# Patient Record
Sex: Male | Born: 1950 | Race: White | Hispanic: No | Marital: Married | Smoking: Never smoker
Health system: Southern US, Community
[De-identification: ages and names within clinical notes are randomized; demographics above are authoritative.]

## PROBLEM LIST (undated history)

## (undated) DIAGNOSIS — E119 Type 2 diabetes mellitus without complications: Secondary | ICD-10-CM

## (undated) DIAGNOSIS — D696 Thrombocytopenia, unspecified: Secondary | ICD-10-CM

## (undated) DIAGNOSIS — K7460 Unspecified cirrhosis of liver: Secondary | ICD-10-CM

## (undated) DIAGNOSIS — E7800 Pure hypercholesterolemia, unspecified: Secondary | ICD-10-CM

## (undated) DIAGNOSIS — M109 Gout, unspecified: Secondary | ICD-10-CM

## (undated) DIAGNOSIS — C4490 Unspecified malignant neoplasm of skin, unspecified: Secondary | ICD-10-CM

## (undated) DIAGNOSIS — I10 Essential (primary) hypertension: Secondary | ICD-10-CM

## (undated) DIAGNOSIS — N400 Enlarged prostate without lower urinary tract symptoms: Secondary | ICD-10-CM

## (undated) HISTORY — DX: Unspecified cirrhosis of liver: K74.60

## (undated) HISTORY — PX: KNEE ARTHROSCOPY: SUR90

---

## 2006-06-26 ENCOUNTER — Ambulatory Visit: Payer: Self-pay | Admitting: Gastroenterology

## 2013-12-15 DIAGNOSIS — M1A9XX0 Chronic gout, unspecified, without tophus (tophi): Secondary | ICD-10-CM | POA: Insufficient documentation

## 2013-12-15 DIAGNOSIS — I10 Essential (primary) hypertension: Secondary | ICD-10-CM | POA: Insufficient documentation

## 2013-12-15 DIAGNOSIS — E7800 Pure hypercholesterolemia, unspecified: Secondary | ICD-10-CM | POA: Insufficient documentation

## 2014-06-17 DIAGNOSIS — E119 Type 2 diabetes mellitus without complications: Secondary | ICD-10-CM | POA: Insufficient documentation

## 2015-01-08 DIAGNOSIS — C4490 Unspecified malignant neoplasm of skin, unspecified: Secondary | ICD-10-CM

## 2015-01-08 HISTORY — DX: Unspecified malignant neoplasm of skin, unspecified: C44.90

## 2015-09-25 NOTE — Discharge Instructions (Signed)

## 2015-09-27 ENCOUNTER — Ambulatory Visit: Payer: Commercial Managed Care - PPO | Admitting: Anesthesiology

## 2015-09-27 ENCOUNTER — Encounter
Admission: RE | Disposition: A | Discharge status: Home / Self Care | Payer: Self-pay | Source: Ambulatory Visit | Attending: Ophthalmology

## 2015-09-27 ENCOUNTER — Ambulatory Visit
Admission: RE | Admit: 2015-09-27 | Discharge: 2015-09-27 | Disposition: A | Discharge status: Home / Self Care | Payer: Commercial Managed Care - PPO | Source: Ambulatory Visit | Attending: Ophthalmology | Admitting: Ophthalmology

## 2015-09-27 DIAGNOSIS — I10 Essential (primary) hypertension: Secondary | ICD-10-CM | POA: Diagnosis not present

## 2015-09-27 DIAGNOSIS — Z7984 Long term (current) use of oral hypoglycemic drugs: Secondary | ICD-10-CM | POA: Insufficient documentation

## 2015-09-27 DIAGNOSIS — M109 Gout, unspecified: Secondary | ICD-10-CM | POA: Insufficient documentation

## 2015-09-27 DIAGNOSIS — E7800 Pure hypercholesterolemia, unspecified: Secondary | ICD-10-CM | POA: Insufficient documentation

## 2015-09-27 DIAGNOSIS — H2511 Age-related nuclear cataract, right eye: Secondary | ICD-10-CM | POA: Insufficient documentation

## 2015-09-27 DIAGNOSIS — Z79899 Other long term (current) drug therapy: Secondary | ICD-10-CM | POA: Insufficient documentation

## 2015-09-27 DIAGNOSIS — E1136 Type 2 diabetes mellitus with diabetic cataract: Secondary | ICD-10-CM | POA: Insufficient documentation

## 2015-09-27 HISTORY — PX: CATARACT EXTRACTION W/PHACO: SHX586

## 2015-09-27 HISTORY — DX: Unspecified malignant neoplasm of skin, unspecified: C44.90

## 2015-09-27 HISTORY — DX: Type 2 diabetes mellitus without complications: E11.9

## 2015-09-27 HISTORY — DX: Essential (primary) hypertension: I10

## 2015-09-27 HISTORY — DX: Gout, unspecified: M10.9

## 2015-09-27 HISTORY — DX: Pure hypercholesterolemia, unspecified: E78.00

## 2015-09-27 HISTORY — DX: Benign prostatic hyperplasia without lower urinary tract symptoms: N40.0

## 2015-09-27 LAB — GLUCOSE, CAPILLARY
Glucose-Capillary: 136 mg/dL — ABNORMAL HIGH (ref 65–99)
Glucose-Capillary: 137 mg/dL — ABNORMAL HIGH (ref 65–99)

## 2015-09-27 SURGERY — PHACOEMULSIFICATION, CATARACT, WITH IOL INSERTION
Anesthesia: Monitor Anesthesia Care | Site: Eye | Laterality: Right | Wound class: Clean

## 2015-09-27 MED ORDER — ACETAMINOPHEN 160 MG/5ML PO SOLN: 325.0000 mg | ORAL | Status: DC | PRN

## 2015-09-27 MED ORDER — TETRACAINE HCL 0.5 % OP SOLN
1.0000 [drp] | OPHTHALMIC | Status: DC | PRN
  Administered 2015-09-27: 1 [drp] via OPHTHALMIC

## 2015-09-27 MED ORDER — MIDAZOLAM HCL 2 MG/2ML IJ SOLN
INTRAMUSCULAR | Status: DC | PRN
  Administered 2015-09-27: 2 mg via INTRAVENOUS

## 2015-09-27 MED ORDER — ARMC OPHTHALMIC DILATING GEL
1.0000 "application " | OPHTHALMIC | Status: DC | PRN
  Administered 2015-09-27 (×2): 1 via OPHTHALMIC

## 2015-09-27 MED ORDER — POVIDONE-IODINE 5 % OP SOLN
1.0000 "application " | OPHTHALMIC | Status: DC | PRN
  Administered 2015-09-27: 1 via OPHTHALMIC

## 2015-09-27 MED ORDER — FENTANYL CITRATE (PF) 100 MCG/2ML IJ SOLN
INTRAMUSCULAR | Status: DC | PRN
  Administered 2015-09-27: 50 ug via INTRAVENOUS

## 2015-09-27 MED ORDER — CEFUROXIME OPHTHALMIC INJECTION 1 MG/0.1 ML
INJECTION | OPHTHALMIC | Status: DC | PRN
  Administered 2015-09-27: 0.1 mL via OPHTHALMIC

## 2015-09-27 MED ORDER — EPINEPHRINE HCL 1 MG/ML IJ SOLN
INTRAOCULAR | Status: DC | PRN
  Administered 2015-09-27: 67 mL via OPHTHALMIC

## 2015-09-27 MED ORDER — TIMOLOL MALEATE 0.5 % OP SOLN
OPHTHALMIC | Status: DC | PRN
  Administered 2015-09-27: 1 [drp] via OPHTHALMIC

## 2015-09-27 MED ORDER — NA HYALUR & NA CHOND-NA HYALUR 0.4-0.35 ML IO KIT
PACK | INTRAOCULAR | Status: DC | PRN
  Administered 2015-09-27: 1 mL via INTRAOCULAR

## 2015-09-27 MED ORDER — ACETAMINOPHEN 325 MG PO TABS: 325.0000 mg | ORAL_TABLET | ORAL | Status: DC | PRN

## 2015-09-27 MED ORDER — BRIMONIDINE TARTRATE 0.2 % OP SOLN
OPHTHALMIC | Status: DC | PRN
  Administered 2015-09-27: 1 [drp] via OPHTHALMIC

## 2015-09-27 MED ORDER — LIDOCAINE HCL (PF) 4 % IJ SOLN
INTRAMUSCULAR | Status: DC | PRN
  Administered 2015-09-27: 1 mL via OPHTHALMIC

## 2015-09-27 SURGICAL SUPPLY — 25 items
CANNULA ANT/CHMB 27GA (MISCELLANEOUS) ×3 IMPLANT
CARTRIDGE ABBOTT (MISCELLANEOUS) IMPLANT
GLOVE SURG LX 7.5 STRW (GLOVE) ×2
GLOVE SURG LX STRL 7.5 STRW (GLOVE) ×1 IMPLANT
GLOVE SURG TRIUMPH 8.0 PF LTX (GLOVE) ×3 IMPLANT
GOWN STRL REUS W/ TWL LRG LVL3 (GOWN DISPOSABLE) ×2 IMPLANT
GOWN STRL REUS W/TWL LRG LVL3 (GOWN DISPOSABLE) ×4
LENS IOL TECNIS ITEC 20.0 (Intraocular Lens) ×3 IMPLANT
MARKER SKIN DUAL TIP RULER LAB (MISCELLANEOUS) ×3 IMPLANT
NDL RETROBULBAR .5 NSTRL (NEEDLE) IMPLANT
NEEDLE FILTER BLUNT 18X 1/2SAF (NEEDLE) ×4
NEEDLE FILTER BLUNT 18X1 1/2 (NEEDLE) ×2 IMPLANT
PACK CATARACT BRASINGTON (MISCELLANEOUS) ×3 IMPLANT
PACK EYE AFTER SURG (MISCELLANEOUS) ×3 IMPLANT
PACK OPTHALMIC (MISCELLANEOUS) ×3 IMPLANT
RING MALYGIN 7.0 (MISCELLANEOUS) IMPLANT
SUT ETHILON 10-0 CS-B-6CS-B-6 (SUTURE)
SUT VICRYL  9 0 (SUTURE)
SUT VICRYL 9 0 (SUTURE) IMPLANT
SUTURE EHLN 10-0 CS-B-6CS-B-6 (SUTURE) IMPLANT
SYR 3ML LL SCALE MARK (SYRINGE) ×6 IMPLANT
SYR 5ML LL (SYRINGE) ×3 IMPLANT
SYR TB 1ML LUER SLIP (SYRINGE) ×3 IMPLANT
WATER STERILE IRR 250ML POUR (IV SOLUTION) ×3 IMPLANT
WIPE NON LINTING 3.25X3.25 (MISCELLANEOUS) ×3 IMPLANT

## 2015-09-27 NOTE — Transfer of Care (Signed)
Immediate Anesthesia Transfer of Care Note  Patient: Dean Lynch  Procedure(s) Performed: Procedure(s) with comments: CATARACT EXTRACTION PHACO AND INTRAOCULAR LENS PLACEMENT (IOC) (Right) - DIABETIC  Patient Location: PACU  Anesthesia Type: MAC  Level of Consciousness: awake, alert  and patient cooperative  Airway and Oxygen Therapy: Patient Spontanous Breathing and Patient connected to supplemental oxygen  Post-op Assessment: Post-op Vital signs reviewed, Patient's Cardiovascular Status Stable, Respiratory Function Stable, Patent Airway and No signs of Nausea or vomiting  Post-op Vital Signs: Reviewed and stable  Complications: No apparent anesthesia complications

## 2015-09-27 NOTE — H&P (Signed)
The History and Physical notes are on paper, have been signed, and are to be scanned. The patient remains stable and unchanged from the H&P.   Previous H&P reviewed, patient examined, and there are no changes.  Dean Lynch 09/27/2015 7:39 AM

## 2015-09-27 NOTE — Anesthesia Procedure Notes (Signed)
Procedure Name: MAC Performed by: Jaslynn Thome Pre-anesthesia Checklist: Patient identified, Emergency Drugs available, Suction available, Timeout performed and Patient being monitored Patient Re-evaluated:Patient Re-evaluated prior to inductionOxygen Delivery Method: Nasal cannula Placement Confirmation: positive ETCO2     

## 2015-09-27 NOTE — Anesthesia Preprocedure Evaluation (Signed)
Anesthesia Evaluation  Patient identified by MRN, date of birth, ID band Patient awake    Reviewed: Allergy & Precautions, H&P , NPO status , Patient's Chart, lab work & pertinent test results  Airway Mallampati: II  TM Distance: >3 FB Neck ROM: full    Dental   Pulmonary    breath sounds clear to auscultation       Cardiovascular hypertension,  Rhythm:regular Rate:Normal     Neuro/Psych    GI/Hepatic   Endo/Other  diabetes  Renal/GU      Musculoskeletal   Abdominal   Peds  Hematology   Anesthesia Other Findings   Reproductive/Obstetrics                             Anesthesia Physical Anesthesia Plan  ASA: II  Anesthesia Plan: MAC   Post-op Pain Management:    Induction:   Airway Management Planned:   Additional Equipment:   Intra-op Plan:   Post-operative Plan:   Informed Consent: I have reviewed the patients History and Physical, chart, labs and discussed the procedure including the risks, benefits and alternatives for the proposed anesthesia with the patient or authorized representative who has indicated his/her understanding and acceptance.     Plan Discussed with: CRNA  Anesthesia Plan Comments:         Anesthesia Quick Evaluation

## 2015-09-27 NOTE — Op Note (Signed)
LOCATION:  [place]   PREOPERATIVE DIAGNOSIS:    Nuclear sclerotic cataract right eye. H25.11   POSTOPERATIVE DIAGNOSIS:  Nuclear sclerotic cataract right eye.     PROCEDURE:  Phacoemusification with posterior chamber intraocular lens placement of the right eye   LENS:   Implant Name Type Inv. Item Serial No. Manufacturer Lot No. LRB No. Used  LENS IOL DIOP 20.0 - [id number] Intraocular Lens LENS IOL DIOP 20.0 [id number]   Right 1        ULTRASOUND TIME: 20 % of 1 minutes, 20 seconds.  CDE 16.0   SURGEON:  Wyonia Hough, MD   ANESTHESIA:  Topical with tetracaine drops and 2% Xylocaine jelly, augmented with 1% preservative-free intracameral lidocaine.    COMPLICATIONS:  None.   DESCRIPTION OF PROCEDURE:  The patient was identified in the holding room and transported to the operating room and placed in the supine position under the operating microscope.  The right eye was identified as the operative eye and it was prepped and draped in the usual sterile ophthalmic fashion.   A 1 millimeter clear-corneal paracentesis was made at the 12:00 position.  0.5 ml of preservative-free 1% lidocaine was injected into the anterior chamber. The anterior chamber was filled with Viscoat viscoelastic.  A 2.4 millimeter keratome was used to make a near-clear corneal incision at the 9:00 position.  A curvilinear capsulorrhexis was made with a cystotome and capsulorrhexis forceps.  Balanced salt solution was used to hydrodissect and hydrodelineate the nucleus.   Phacoemulsification was then used in stop and chop fashion to remove the lens nucleus and epinucleus.  The remaining cortex was then removed using the irrigation and aspiration handpiece. Provisc was then placed into the capsular bag to distend it for lens placement.  A lens was then injected into the capsular bag.  The remaining viscoelastic was aspirated.   Wounds were hydrated with balanced salt solution.  The anterior  chamber was inflated to a physiologic pressure with balanced salt solution.  No wound leaks were noted. Cefuroxime 0.1 ml of a 10mg /ml solution was injected into the anterior chamber for a dose of 1 mg of intracameral antibiotic at the completion of the case.   Timolol and Brimonidine drops were applied to the eye.  The patient was taken to the recovery room in stable condition without complications of anesthesia or surgery.   Racquelle Hyser 09/27/2015, 8:00 AM

## 2015-09-27 NOTE — Anesthesia Postprocedure Evaluation (Signed)
Anesthesia Post Note  Patient: Dean Lynch  Procedure(s) Performed: Procedure(s) (LRB): CATARACT EXTRACTION PHACO AND INTRAOCULAR LENS PLACEMENT (IOC) (Right)  Patient location during evaluation: PACU Anesthesia Type: MAC Level of consciousness: awake and alert Pain management: pain level controlled Vital Signs Assessment: post-procedure vital signs reviewed and stable Respiratory status: spontaneous breathing, nonlabored ventilation, respiratory function stable and patient connected to nasal cannula oxygen Cardiovascular status: stable and blood pressure returned to baseline Anesthetic complications: no    Amaryllis Dyke

## 2015-09-28 ENCOUNTER — Encounter: Payer: Self-pay | Admitting: Ophthalmology

## 2015-12-06 DIAGNOSIS — Z85820 Personal history of malignant melanoma of skin: Secondary | ICD-10-CM | POA: Insufficient documentation

## 2016-07-16 DIAGNOSIS — N400 Enlarged prostate without lower urinary tract symptoms: Secondary | ICD-10-CM | POA: Insufficient documentation

## 2016-07-18 DIAGNOSIS — D696 Thrombocytopenia, unspecified: Secondary | ICD-10-CM | POA: Insufficient documentation

## 2016-08-05 ENCOUNTER — Encounter: Payer: Self-pay | Admitting: Oncology

## 2016-08-05 ENCOUNTER — Inpatient Hospital Stay: Payer: Commercial Managed Care - PPO

## 2016-08-05 ENCOUNTER — Inpatient Hospital Stay: Payer: Commercial Managed Care - PPO | Attending: Oncology | Admitting: Oncology

## 2016-08-05 VITALS — BP 111/70 | HR 71 | Temp 96.4°F | Resp 18 | Ht 69.29 in | Wt 210.2 lb

## 2016-08-05 DIAGNOSIS — N400 Enlarged prostate without lower urinary tract symptoms: Secondary | ICD-10-CM | POA: Insufficient documentation

## 2016-08-05 DIAGNOSIS — M1A9XX0 Chronic gout, unspecified, without tophus (tophi): Secondary | ICD-10-CM | POA: Diagnosis not present

## 2016-08-05 DIAGNOSIS — Z7982 Long term (current) use of aspirin: Secondary | ICD-10-CM | POA: Insufficient documentation

## 2016-08-05 DIAGNOSIS — E7800 Pure hypercholesterolemia, unspecified: Secondary | ICD-10-CM | POA: Diagnosis not present

## 2016-08-05 DIAGNOSIS — D696 Thrombocytopenia, unspecified: Secondary | ICD-10-CM

## 2016-08-05 DIAGNOSIS — E119 Type 2 diabetes mellitus without complications: Secondary | ICD-10-CM | POA: Diagnosis not present

## 2016-08-05 DIAGNOSIS — Z85820 Personal history of malignant melanoma of skin: Secondary | ICD-10-CM | POA: Insufficient documentation

## 2016-08-05 DIAGNOSIS — Z7984 Long term (current) use of oral hypoglycemic drugs: Secondary | ICD-10-CM | POA: Diagnosis not present

## 2016-08-05 DIAGNOSIS — I10 Essential (primary) hypertension: Secondary | ICD-10-CM | POA: Diagnosis not present

## 2016-08-05 LAB — CBC WITH DIFFERENTIAL/PLATELET
BASOS ABS: 0 10*3/uL (ref 0–0.1)
BASOS PCT: 0 %
Eosinophils Absolute: 0.1 10*3/uL (ref 0–0.7)
Eosinophils Relative: 2 %
HCT: 39.2 % — ABNORMAL LOW (ref 40.0–52.0)
Hemoglobin: 13.8 g/dL (ref 13.0–18.0)
LYMPHS ABS: 1.8 10*3/uL (ref 1.0–3.6)
LYMPHS PCT: 28 %
MCH: 35.5 pg — ABNORMAL HIGH (ref 26.0–34.0)
MCHC: 35.3 g/dL (ref 32.0–36.0)
MCV: 100.7 fL — AB (ref 80.0–100.0)
Monocytes Absolute: 0.5 10*3/uL (ref 0.2–1.0)
Monocytes Relative: 8 %
NEUTROS PCT: 62 %
Neutro Abs: 4 10*3/uL (ref 1.4–6.5)
PLATELETS: 101 10*3/uL — AB (ref 150–440)
RBC: 3.89 MIL/uL — AB (ref 4.40–5.90)
RDW: 12.7 % (ref 11.5–14.5)
WBC: 6.4 10*3/uL (ref 3.8–10.6)

## 2016-08-05 LAB — PATHOLOGIST SMEAR REVIEW

## 2016-08-05 LAB — VITAMIN B12: VITAMIN B 12: 458 pg/mL (ref 180–914)

## 2016-08-05 LAB — FOLATE: FOLATE: 27 ng/mL (ref >5.9)

## 2016-08-05 NOTE — Progress Notes (Signed)
Hematology/Oncology Consult note [facility] [phone] [phone]  Patient Care Team: Alanson Aly, [place] as PCP - General (Family Medicine)   Name of the patient: Dean Lynch  [id number]  [birth year]    Reason for referral- thrombocytopenia   Referring physician- Alanson Aly FNP  Date of visit: 08/05/16   History of presenting illness- patient is a 66 year old male with a past medical history significant for hypertension, hyperlipidemia and type 2 diabetes and other medical problems. He has been referred to [place] for evaluation and management of thrombocytopenia. Recent CBC from 07/16/2016 showed white count of 7.1, H&H of 9/38.2 with an MCV of 97 and a platelet count of 110. On review of his prior CBCs since 2015 his platelet counts have always been between [phone]. Recent CMP was within normal limits except for a mildly elevated total bilirubin of 1.5. HIV and hepatitis C testing was negative. TSH was within normal limits.  He denies any bleeding, burising other than occasional bruises on his forearms. No OTC medications or herbal supplements. Feels well. Denies any complaints today  ECOG PS- 0  Pain scale- 0   Review of systems- Review of Systems  Constitutional: Negative for chills, fever, malaise/fatigue and weight loss.  HENT: Negative for congestion, ear discharge and nosebleeds.   Eyes: Negative for blurred vision.  Respiratory: Negative for cough, hemoptysis, sputum production, shortness of breath and wheezing.   Cardiovascular: Negative for chest pain, palpitations, orthopnea and claudication.  Gastrointestinal: Negative for abdominal pain, blood in stool, constipation, diarrhea, heartburn, melena, nausea and vomiting.  Genitourinary: Negative for dysuria, flank pain, frequency, hematuria and urgency.  Musculoskeletal: Negative for back pain, joint pain and myalgias.  Skin: Negative for rash.  Neurological:  Negative for dizziness, tingling, focal weakness, seizures, weakness and headaches.  Endo/Heme/Allergies: Does not bruise/bleed easily.  Psychiatric/Behavioral: Negative for depression and suicidal ideas. The patient does not have insomnia.     No Known Allergies  Patient Active Problem List   Diagnosis Date Noted  . Thrombocytopenia ([place]) 07/18/2016  . Benign prostatic hyperplasia without lower urinary tract symptoms 07/16/2016  . History of melanoma 12/06/2015  . Diabetes mellitus type 2, uncomplicated ([place]) 47/42/5956  . 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 tophus 12/15/2013  . Essential hypertension 12/15/2013  . Hypercholesterolemia 12/15/2013     Past Medical History:  Diagnosis Date  . BPH (benign prostatic hyperplasia)   . Diabetes mellitus without complication ([place])   . Gout   . Hypercholesteremia   . Hypertension   . Skin cancer 2017   melanoa- removed at [facility]     Past Surgical History:  Procedure Laterality Date  . CATARACT EXTRACTION W/PHACO Right 09/27/2015   Procedure: CATARACT EXTRACTION PHACO AND INTRAOCULAR LENS PLACEMENT (IOC);  Surgeon: Leandrew Koyanagi, MD;  Location: [place];  Service: Ophthalmology;  Laterality: Right;  DIABETIC  . KNEE ARTHROSCOPY      Social History   Social History  . Marital status: Married    Spouse name: N/A  . Number of children: N/A  . Years of education: N/A   Occupational History  . Not on file.   Social History Main Topics  . Smoking status: Never Smoker  . Smokeless tobacco: Never Used  . Alcohol use No  . Drug use: No  . Sexual activity: Yes   Other Topics Concern  . Not on file   Social History Narrative  . No narrative on file     Family History  Problem Relation Age of Onset  .  Coronary artery disease Mother   . Polycythemia Mother   . Macular degeneration Mother   . Dementia Mother   . Coronary artery disease Father      Current Outpatient Prescriptions:  .  aspirin EC 81 MG tablet, Take by  mouth., Disp: , Rfl:  .  dutasteride (AVODART) 0.5 MG capsule, Take 0.5 mg by mouth every morning., Disp: , Rfl:  .  glimepiride (AMARYL) 1 MG tablet, Take 1 mg by mouth daily with breakfast., Disp: , Rfl: 0 .  hydrocortisone 2.5 % ointment, Apply topically., Disp: , Rfl:  .  ketoconazole (NIZORAL) 2 % cream, Apply topically., Disp: , Rfl:  .  lisinopril-hydrochlorothiazide (PRINZIDE,ZESTORETIC) 20-12.5 MG tablet, Take 1 tablet by mouth every morning., Disp: , Rfl:  .  lovastatin (MEVACOR) 10 MG tablet, Take 10 mg by mouth 3 (three) times a week., Disp: , Rfl:  .  allopurinol (ZYLOPRIM) 300 MG tablet, Take 300 mg by mouth every morning., Disp: , Rfl:    Physical exam:  Vitals:   08/05/16 0909  BP: 111/70  Pulse: 71  Resp: 18  Temp: (!) 96.4 F (35.8 C)  TempSrc: Tympanic  Weight: 210 lb 3.3 oz (95.4 kg)  Height: 5' 9.29" (1.76 m)   Physical Exam  Constitutional: He is oriented to person, place, and time and well-developed, well-nourished, and in no distress.  HENT:  Head: Normocephalic and atraumatic.  Eyes: Pupils are equal, round, and reactive to light. EOM are normal.  Neck: Normal range of motion.  Cardiovascular: Normal rate, regular rhythm and normal heart sounds.   Pulmonary/Chest: Effort normal and breath sounds normal.  Abdominal: Soft. Bowel sounds are normal.  No palpable splenomegaly  Lymphadenopathy:  No palpable cervical, supraclavicular, axillary or inguinal adenopathy  Neurological: He is alert and oriented to person, place, and time.  Skin: Skin is warm and dry.       Assessment and plan- Patient is a 66 y.o. 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 No other cytopenias. HIV and Hep C testing negative. Among his medications- glimepiride, lisinopril and HCTZ have about 1% chance of causing thrombocytopenia but it is not clear if his thrombocytopenia  is drug induced or due to ITP. I would favor latter. Today I will check cbc with diff, pathology review of his smear, B12 and folate etstign. I will see him back in 1 weeks time. Given absence of other cytopenias and mild stable thrombocytopenia for last 3 years, he does not need bone marrow biopsy at this time. 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 As such he can continue his aspirin and is not at an ioncreased risk of bleeding   Thank you for this kind referral and the opportunity to participate in the care of this patient   Visit Diagnosis 1. Thrombocytopenia ([place])     Dr. Randa Evens, MD, MPH [facility] at [facility] Pager- [phone] 08/05/2016

## 2016-08-05 NOTE — Progress Notes (Signed)
Here for new pt evaluation.  

## 2016-08-12 ENCOUNTER — Inpatient Hospital Stay: Payer: Commercial Managed Care - PPO | Attending: Oncology | Admitting: Oncology

## 2016-08-12 ENCOUNTER — Encounter: Payer: Self-pay | Admitting: Oncology

## 2016-08-12 VITALS — BP 107/68 | HR 71 | Temp 98.2°F | Resp 18 | Ht 69.29 in | Wt 212.3 lb

## 2016-08-12 DIAGNOSIS — D7589 Other specified diseases of blood and blood-forming organs: Secondary | ICD-10-CM | POA: Insufficient documentation

## 2016-08-12 DIAGNOSIS — I10 Essential (primary) hypertension: Secondary | ICD-10-CM | POA: Diagnosis not present

## 2016-08-12 DIAGNOSIS — N400 Enlarged prostate without lower urinary tract symptoms: Secondary | ICD-10-CM | POA: Insufficient documentation

## 2016-08-12 DIAGNOSIS — M109 Gout, unspecified: Secondary | ICD-10-CM | POA: Diagnosis not present

## 2016-08-12 DIAGNOSIS — E119 Type 2 diabetes mellitus without complications: Secondary | ICD-10-CM | POA: Insufficient documentation

## 2016-08-12 DIAGNOSIS — E7800 Pure hypercholesterolemia, unspecified: Secondary | ICD-10-CM | POA: Insufficient documentation

## 2016-08-12 DIAGNOSIS — D696 Thrombocytopenia, unspecified: Secondary | ICD-10-CM | POA: Diagnosis not present

## 2016-08-12 DIAGNOSIS — Z79899 Other long term (current) drug therapy: Secondary | ICD-10-CM | POA: Insufficient documentation

## 2016-08-12 DIAGNOSIS — Z85820 Personal history of malignant melanoma of skin: Secondary | ICD-10-CM | POA: Diagnosis not present

## 2016-08-12 NOTE — Progress Notes (Signed)
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 He has been referred to [place] for evaluation and management of thrombocytopenia. Recent CBC from 07/16/2016 showed white count of 7.1, H&H of 9/38.2 with an MCV of 97 and a platelet count of 110. On review of his prior CBCs since 2015 his platelet counts have always been between [phone]'s. Recent CMP was within normal limits except for a mildly elevated total bilirubin of 1.5. HIV and hepatitis C testing was negative. TSH was within normal limits.  He denies any bleeding, burising other than occasional bruises on his forearms. No OTC medications or herbal supplements.  Results of bloodwork from 08/05/2016 were as follows: CBC showed white count of 6.4, H&H of 13.8/39.2 with an MCV of 100.7 and a platelet count of 101. Peripheral blood smear review showed mild thrombocytopenia without clumping. RBC show mild macrocytosis. Leukocyte count and differential within normal limits. No abnormal or immature cells are seen. B12 and folate were within normal limits   Interval history- doing well. Denies any bleeding or bruising.   ECOG PS- 0 Pain scale- 0   Review of systems- Review of Systems  Constitutional: Negative for chills, fever, malaise/fatigue and weight loss.  HENT: Negative for congestion, ear discharge and nosebleeds.   Eyes: Negative for blurred vision.  Respiratory: Negative for cough, hemoptysis, sputum production, shortness  of breath and wheezing.   Cardiovascular: Negative for chest pain, palpitations, orthopnea and claudication.  Gastrointestinal: Negative for abdominal pain, blood in stool, constipation, diarrhea, heartburn, melena, nausea and vomiting.  Genitourinary: Negative for dysuria, flank pain, frequency, hematuria and urgency.  Musculoskeletal: Negative for back pain, joint pain and myalgias.  Skin: Negative for rash.  Neurological: Negative for dizziness, tingling, focal weakness, seizures, weakness and headaches.  Endo/Heme/Allergies: Does not bruise/bleed easily.  Psychiatric/Behavioral: Negative for depression and suicidal ideas. The patient does not have insomnia.      No Known Allergies   Past Medical History:  Diagnosis Date  . BPH (benign prostatic hyperplasia)   . Diabetes mellitus without complication ([place])   . Gout   . Hypercholesteremia   . Hypertension   . Skin cancer 2017   melanoa- [facility] at [facility]     Past Surgical History:  Procedure Laterality Date  . CATARACT EXTRACTION W/PHACO Right 09/27/2015   Procedure: CATARACT EXTRACTION PHACO AND INTRAOCULAR LENS PLACEMENT (IOC);  Surgeon: Leandrew Koyanagi, MD;  Location: [place];  Service: Ophthalmology;  Laterality: Right;  DIABETIC  . KNEE ARTHROSCOPY      Social History   Social History  . Marital status: Married    Spouse name: N/A  . Number of children: N/A  . Years of education: N/A   Occupational History  . Not on file.   Social History Main Topics  . Smoking status: Never Smoker  . Smokeless tobacco: Never Used  . Alcohol use No  . Drug use: No  . Sexual activity: Yes   Other Topics Concern  . Not on file   Social History Narrative  . No narrative  on file    Family History  Problem Relation Age of Onset  . Coronary artery disease Mother   . Polycythemia Mother   . Macular degeneration Mother   . Dementia Mother   . Coronary artery disease Father      Current Outpatient  Prescriptions:  .  allopurinol (ZYLOPRIM) 300 MG tablet, Take 300 mg by mouth every morning., Disp: , Rfl:  .  aspirin EC 81 MG tablet, Take by mouth., Disp: , Rfl:  .  dutasteride (AVODART) 0.5 MG capsule, Take 0.5 mg by mouth every morning., Disp: , Rfl:  .  glimepiride (AMARYL) 1 MG tablet, Take 1 mg by mouth daily with breakfast., Disp: , Rfl: 0 .  hydrocortisone 2.5 % ointment, Apply topically., Disp: , Rfl:  .  ketoconazole (NIZORAL) 2 % cream, Apply topically., Disp: , Rfl:  .  lisinopril-hydrochlorothiazide (PRINZIDE,ZESTORETIC) 20-12.5 MG tablet, Take 1 tablet by mouth every morning., Disp: , Rfl:  .  lovastatin (MEVACOR) 10 MG tablet, Take 10 mg by mouth 3 (three) times a week., Disp: , Rfl:   Physical exam:  Vitals:   08/12/16 1452  BP: 107/68  Pulse: 71  Resp: 18  Temp: 98.2 F (36.8 C)  TempSrc: Tympanic  Weight: 212 lb 4.9 oz (96.3 kg)  Height: 5' 9.29" (1.76 m)   Physical Exam  Constitutional: He is oriented to person, place, and time and well-developed, well-nourished, and in no distress.  HENT:  Head: Normocephalic and atraumatic.  Eyes: Pupils are equal, round, and reactive to light. EOM are normal.  Neck: Normal range of motion.  Cardiovascular: Normal rate, regular rhythm and normal heart sounds.   Pulmonary/Chest: Effort normal and breath sounds normal.  Abdominal: Soft. Bowel sounds are normal.  Neurological: He is alert and oriented to person, place, and time.  Skin: Skin is warm and dry.     No flowsheet data found. CBC Latest Ref Rng & Units 08/05/2016  WBC 3.8 - 10.6 K/uL 6.4  Hemoglobin 13.0 - 18.0 g/dL 13.8  Hematocrit 40.0 - 52.0 % 39.2(L)  Platelets 150 - 440 K/uL 101(L)      Assessment and plan- Patient is a 66 y.o. male referred for mild thrombocytopenia  Platelet count stable between 100-110's since 2015. HIV, Hep C, b12 and folate etsting normal. Wbc and H/H normal. This is likely due to itp and can be monitored conservatively. He does  have mild macrocytosis without overt anemia. Continue to monitor. rtc in 6 months with cbc   Visit Diagnosis 1. Thrombocytopenia ([place])   2. Macrocytosis      Dr. Randa Evens, MD, MPH [place] at [facility] Pager- [phone] 08/12/2016

## 2016-08-12 NOTE — Progress Notes (Signed)
Pt here to get his lab results about his thrombocytopenia.

## 2017-02-17 ENCOUNTER — Inpatient Hospital Stay: Payer: Commercial Managed Care - PPO | Admitting: Oncology

## 2017-02-17 ENCOUNTER — Other Ambulatory Visit: Payer: Commercial Managed Care - PPO

## 2017-02-17 ENCOUNTER — Ambulatory Visit: Payer: Commercial Managed Care - PPO | Admitting: Oncology

## 2017-02-17 ENCOUNTER — Inpatient Hospital Stay: Payer: Commercial Managed Care - PPO

## 2017-03-03 ENCOUNTER — Inpatient Hospital Stay: Payer: Commercial Managed Care - PPO | Attending: Oncology | Admitting: Oncology

## 2017-03-03 ENCOUNTER — Encounter: Payer: Self-pay | Admitting: Oncology

## 2017-03-03 ENCOUNTER — Inpatient Hospital Stay: Payer: Commercial Managed Care - PPO

## 2017-03-03 VITALS — BP 112/70 | HR 76 | Temp 98.5°F | Resp 16 | Wt 222.7 lb

## 2017-03-03 DIAGNOSIS — I10 Essential (primary) hypertension: Secondary | ICD-10-CM | POA: Diagnosis not present

## 2017-03-03 DIAGNOSIS — D696 Thrombocytopenia, unspecified: Secondary | ICD-10-CM

## 2017-03-03 DIAGNOSIS — D693 Immune thrombocytopenic purpura: Secondary | ICD-10-CM | POA: Diagnosis not present

## 2017-03-03 DIAGNOSIS — E119 Type 2 diabetes mellitus without complications: Secondary | ICD-10-CM | POA: Insufficient documentation

## 2017-03-03 LAB — CBC WITH DIFFERENTIAL/PLATELET
BASOS PCT: 0 %
Basophils Absolute: 0 10*3/uL (ref 0–0.1)
Eosinophils Absolute: 0.1 10*3/uL (ref 0–0.7)
Eosinophils Relative: 2 %
HCT: 38.2 % — ABNORMAL LOW (ref 40.0–52.0)
HEMOGLOBIN: 13.9 g/dL (ref 13.0–18.0)
Lymphocytes Relative: 31 %
Lymphs Abs: 2.1 10*3/uL (ref 1.0–3.6)
MCH: 36.3 pg — ABNORMAL HIGH (ref 26.0–34.0)
MCHC: 36.5 g/dL — ABNORMAL HIGH (ref 32.0–36.0)
MCV: 99.6 fL (ref 80.0–100.0)
Monocytes Absolute: 0.5 10*3/uL (ref 0.2–1.0)
Monocytes Relative: 8 %
NEUTROS ABS: 3.9 10*3/uL (ref 1.4–6.5)
NEUTROS PCT: 59 %
Platelets: 113 10*3/uL — ABNORMAL LOW (ref 150–440)
RBC: 3.83 MIL/uL — AB (ref 4.40–5.90)
RDW: 12.8 % (ref 11.5–14.5)
WBC: 6.6 10*3/uL (ref 3.8–10.6)

## 2017-03-03 NOTE — Progress Notes (Signed)
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 He has been referred to [place] for evaluation and management of thrombocytopenia. Recent CBC from 07/16/2016 showed white count of 7.1, H&H of 9/38.2 with an MCV of 97 and a platelet count of 110. On review of his prior CBCs since 2015 his platelet counts have always been between [phone]. Recent CMP was within normal limits except for a mildly elevated total bilirubin of 1.5. HIV and hepatitis C testing was negative. TSH was within normal limits.  He denies any bleeding, burising other than occasional bruises on his forearms. No OTC medications or herbal supplements.  Results of bloodwork from 08/05/2016 were as follows: CBC showed white count of 6.4, H&H of 13.8/39.2 with an MCV of 100.7 and a platelet count of 101. Peripheral blood smear review showed mild thrombocytopenia without clumping. RBC show mild macrocytosis. Leukocyte count and differential within normal limits. No abnormal or immature cells are seen. B12 and folate were within normal limits  Interval history- Patient is doing well. Denies any fatigue, bleeding, bruising.   ECOG PS- 0 Pain scale- 0   Review of systems- Review of Systems  Constitutional: Negative for chills, fever, malaise/fatigue and weight loss.  HENT: Negative for congestion, ear discharge and nosebleeds.   Eyes: Negative for blurred vision.  Respiratory: Negative for cough, hemoptysis, sputum production,  shortness of breath and wheezing.   Cardiovascular: Negative for chest pain, palpitations, orthopnea and claudication.  Gastrointestinal: Negative for abdominal pain, blood in stool, constipation, diarrhea, heartburn, melena, nausea and vomiting.  Genitourinary: Negative for dysuria, flank pain, frequency, hematuria and urgency.  Musculoskeletal: Negative for back pain, joint pain and myalgias.  Skin: Negative for rash.  Neurological: Negative for dizziness, tingling, focal weakness, seizures, weakness and headaches.  Endo/Heme/Allergies: Does not bruise/bleed easily.  Psychiatric/Behavioral: Negative for depression and suicidal ideas. The patient does not have insomnia.      No Known Allergies   Past Medical History:  Diagnosis Date  . BPH (benign prostatic hyperplasia)   . Diabetes mellitus without complication ([place])   . Gout   . Hypercholesteremia   . Hypertension   . Skin cancer 2017   melanoa- removed at [facility]     Past Surgical History:  Procedure Laterality Date  . CATARACT EXTRACTION W/PHACO Right 09/27/2015   Procedure: CATARACT EXTRACTION PHACO AND INTRAOCULAR LENS PLACEMENT (IOC);  Surgeon: Leandrew Koyanagi, MD;  Location: [place];  Service: Ophthalmology;  Laterality: Right;  DIABETIC  . KNEE ARTHROSCOPY      Social History   Socioeconomic History  . Marital status: Married    Spouse name: Not on file  . Number of children: Not on file  . Years of education: Not on file  . Highest education level: Not on file  Social Needs  . Financial resource strain: Not on file  . Food insecurity - worry: Not on file  . Food insecurity - inability: Not on file  . Transportation needs - medical: Not on file  . Transportation needs - non-medical: Not on  file  Occupational History  . Not on file  Tobacco Use  . Smoking status: Never Smoker  . Smokeless tobacco: Never Used  Substance and Sexual Activity  . Alcohol use: No  . Drug use: No  . Sexual activity:  Yes  Other Topics Concern  . Not on file  Social History Narrative  . Not on file    Family History  Problem Relation Age of Onset  . Coronary artery disease Mother   . Polycythemia Mother   . Macular degeneration Mother   . Dementia Mother   . Coronary artery disease Father      Current Outpatient Medications:  .  dutasteride (AVODART) 0.5 MG capsule, Take 0.5 mg by mouth every morning., Disp: , Rfl:  .  glimepiride (AMARYL) 1 MG tablet, Take 1 mg by mouth daily with breakfast., Disp: , Rfl: 0 .  hydrocortisone 2.5 % ointment, Apply 1 application topically daily. , Disp: , Rfl:  .  ketoconazole (NIZORAL) 2 % cream, Apply 1 application topically daily. , Disp: , Rfl:  .  lisinopril-hydrochlorothiazide (PRINZIDE,ZESTORETIC) 20-12.5 MG tablet, Take 1 tablet by mouth every morning., Disp: , Rfl:  .  lovastatin (MEVACOR) 10 MG tablet, Take 10 mg by mouth 3 (three) times a week., Disp: , Rfl:   Physical exam:  Vitals:   03/03/17 1313  BP: 112/70  Pulse: 76  Resp: 16  Temp: 98.5 F (36.9 C)  TempSrc: Tympanic  Weight: 222 lb 10.6 oz (101 kg)   Physical Exam  Constitutional: He is oriented to person, place, and time and well-developed, well-nourished, and in no distress.  HENT:  Head: Normocephalic and atraumatic.  Eyes: EOM are normal. Pupils are equal, round, and reactive to light.  Neck: Normal range of motion.  Cardiovascular: Normal rate, regular rhythm and normal heart sounds.  Pulmonary/Chest: Effort normal and breath sounds normal.  Abdominal: Soft. Bowel sounds are normal.  Neurological: He is alert and oriented to person, place, and time.  Skin: Skin is warm and dry.     No flowsheet data found. CBC Latest Ref Rng & Units 08/05/2016  WBC 3.8 - 10.6 K/uL 6.4  Hemoglobin 13.0 - 18.0 g/dL 13.8  Hematocrit 40.0 - 52.0 % 39.2(L)  Platelets 150 - 440 K/uL 101(L)     Assessment and plan- Patient is a 67 y.o. 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 Stable as compared to 7 months ago. No clinical bleeding. This can be monitored conservatively. Repeat cbc with diff in 6 montsh and 1 year and I will see him back in 1 years time   Visit Diagnosis 1. Thrombocytopenia ([place])      Dr. Randa Evens, MD, MPH [facility] at [facility] Pager- [phone] 03/03/2017 1:00 PM

## 2017-09-01 ENCOUNTER — Inpatient Hospital Stay: Payer: Commercial Managed Care - PPO | Attending: Oncology

## 2017-09-01 DIAGNOSIS — D696 Thrombocytopenia, unspecified: Secondary | ICD-10-CM

## 2017-09-01 DIAGNOSIS — D693 Immune thrombocytopenic purpura: Secondary | ICD-10-CM | POA: Diagnosis present

## 2017-09-01 LAB — CBC WITH DIFFERENTIAL/PLATELET
BASOS ABS: 0.1 10*3/uL (ref 0–0.1)
Basophils Relative: 1 %
EOS ABS: 0.2 10*3/uL (ref 0–0.7)
EOS PCT: 2 %
HCT: 36.6 % — ABNORMAL LOW (ref 40.0–52.0)
Hemoglobin: 13.5 g/dL (ref 13.0–18.0)
LYMPHS ABS: 2.3 10*3/uL (ref 1.0–3.6)
LYMPHS PCT: 33 %
MCH: 37.1 pg — ABNORMAL HIGH (ref 26.0–34.0)
MCHC: 36.9 g/dL — ABNORMAL HIGH (ref 32.0–36.0)
MCV: 100.7 fL — AB (ref 80.0–100.0)
Monocytes Absolute: 0.7 10*3/uL (ref 0.2–1.0)
Monocytes Relative: 9 %
Neutro Abs: 3.9 10*3/uL (ref 1.4–6.5)
Neutrophils Relative %: 55 %
PLATELETS: 121 10*3/uL — AB (ref 150–440)
RBC: 3.64 MIL/uL — ABNORMAL LOW (ref 4.40–5.90)
RDW: 12.8 % (ref 11.5–14.5)
WBC: 7.1 10*3/uL (ref 3.8–10.6)

## 2018-02-25 DIAGNOSIS — N400 Enlarged prostate without lower urinary tract symptoms: Secondary | ICD-10-CM | POA: Diagnosis not present

## 2018-02-25 DIAGNOSIS — E7800 Pure hypercholesterolemia, unspecified: Secondary | ICD-10-CM | POA: Diagnosis not present

## 2018-02-25 DIAGNOSIS — J349 Unspecified disorder of nose and nasal sinuses: Secondary | ICD-10-CM | POA: Diagnosis not present

## 2018-02-25 DIAGNOSIS — E119 Type 2 diabetes mellitus without complications: Secondary | ICD-10-CM | POA: Diagnosis not present

## 2018-02-25 DIAGNOSIS — N289 Disorder of kidney and ureter, unspecified: Secondary | ICD-10-CM | POA: Diagnosis not present

## 2018-02-25 DIAGNOSIS — I10 Essential (primary) hypertension: Secondary | ICD-10-CM | POA: Diagnosis not present

## 2018-02-25 DIAGNOSIS — M1A49X0 Other secondary chronic gout, multiple sites, without tophus (tophi): Secondary | ICD-10-CM | POA: Diagnosis not present

## 2018-02-25 DIAGNOSIS — H9313 Tinnitus, bilateral: Secondary | ICD-10-CM | POA: Diagnosis not present

## 2018-02-25 DIAGNOSIS — M10062 Idiopathic gout, left knee: Secondary | ICD-10-CM | POA: Diagnosis not present

## 2018-02-25 DIAGNOSIS — D696 Thrombocytopenia, unspecified: Secondary | ICD-10-CM | POA: Diagnosis not present

## 2018-02-25 DIAGNOSIS — Z85820 Personal history of malignant melanoma of skin: Secondary | ICD-10-CM | POA: Diagnosis not present

## 2018-02-25 DIAGNOSIS — Z125 Encounter for screening for malignant neoplasm of prostate: Secondary | ICD-10-CM | POA: Diagnosis not present

## 2018-03-02 ENCOUNTER — Encounter: Payer: Self-pay | Admitting: Oncology

## 2018-03-02 ENCOUNTER — Other Ambulatory Visit: Payer: Commercial Managed Care - PPO

## 2018-03-02 ENCOUNTER — Other Ambulatory Visit: Payer: Self-pay

## 2018-03-02 ENCOUNTER — Inpatient Hospital Stay (HOSPITAL_BASED_OUTPATIENT_CLINIC_OR_DEPARTMENT_OTHER): Payer: PPO | Admitting: Oncology

## 2018-03-02 ENCOUNTER — Ambulatory Visit: Payer: Commercial Managed Care - PPO | Admitting: Oncology

## 2018-03-02 ENCOUNTER — Inpatient Hospital Stay: Payer: PPO | Attending: Oncology

## 2018-03-02 VITALS — BP 112/67 | HR 79 | Temp 97.4°F | Resp 18 | Wt 224.6 lb

## 2018-03-02 DIAGNOSIS — E119 Type 2 diabetes mellitus without complications: Secondary | ICD-10-CM | POA: Diagnosis not present

## 2018-03-02 DIAGNOSIS — D7589 Other specified diseases of blood and blood-forming organs: Secondary | ICD-10-CM | POA: Diagnosis not present

## 2018-03-02 DIAGNOSIS — I10 Essential (primary) hypertension: Secondary | ICD-10-CM

## 2018-03-02 DIAGNOSIS — D696 Thrombocytopenia, unspecified: Secondary | ICD-10-CM | POA: Insufficient documentation

## 2018-03-02 NOTE — Progress Notes (Signed)
Here for follow up. Overall feeling "ok but tired - Most of the time "

## 2018-03-02 NOTE — Progress Notes (Signed)
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 Dean Lynch has been referred to [place] for evaluation and management of thrombocytopenia. Recent CBC from 07/16/2016 showed white count of 7.1, H&H of 9/38.2 with an MCV of 97 and a platelet count of 110. On review of his prior CBCs since 2015 his platelet counts have always been between [phone]'s. Recent CMP was within normal limits except for a mildly elevated total bilirubin of 1.5. HIV and hepatitis C testing was negative. TSH was within normal limits.  Dean Lynch denies any bleeding, burising other than occasional bruises on his forearms. No OTC medications or herbal supplements.  Results of bloodwork from 08/05/2016 were as follows: CBC showed white count of 6.4, H&H of 13.8/39.2 with an MCV of 100.7 and a platelet count of 101. Peripheral blood smear review showed mild thrombocytopenia without clumping. RBC show mild macrocytosis. Leukocyte count and differential within normal limits. No abnormal or immature cells are seen. B12 and folate were within normal limits   Interval history- Dean Lynch is doing well. Hs mild chronic fatigue. No other new complaints  ECOG PS- 1 Pain scale- 0   Review of systems- Review of Systems  Constitutional: Positive for malaise/fatigue. Negative for chills, fever and weight loss.  HENT: Negative for congestion, ear discharge and nosebleeds.   Eyes: Negative for blurred vision.  Respiratory: Negative for  cough, hemoptysis, sputum production, shortness of breath and wheezing.   Cardiovascular: Negative for chest pain, palpitations, orthopnea and claudication.  Gastrointestinal: Negative for abdominal pain, blood in stool, constipation, diarrhea, heartburn, melena, nausea and vomiting.  Genitourinary: Negative for dysuria, flank pain, frequency, hematuria and urgency.  Musculoskeletal: Negative for back pain, joint pain and myalgias.  Skin: Negative for rash.  Neurological: Negative for dizziness, tingling, focal weakness, seizures, weakness and headaches.  Endo/Heme/Allergies: Does not bruise/bleed easily.  Psychiatric/Behavioral: Negative for depression and suicidal ideas. The patient does not have insomnia.       No Known Allergies   Past Medical History:  Diagnosis Date  . BPH (benign prostatic hyperplasia)   . Diabetes mellitus without complication ([place])   . Gout   . Hypercholesteremia   . Hypertension   . Skin cancer 2017   melanoa- removed at [facility]     Past Surgical History:  Procedure Laterality Date  . CATARACT EXTRACTION W/PHACO Right 09/27/2015   Procedure: CATARACT EXTRACTION PHACO AND INTRAOCULAR LENS PLACEMENT (IOC);  Surgeon: Leandrew Koyanagi, MD;  Location: [place];  Service: Ophthalmology;  Laterality: Right;  DIABETIC  . KNEE ARTHROSCOPY      Social History   Socioeconomic History  . Marital status: Married    Spouse name: Not on file  . Number of children: Not on file  . Years of education: Not on file  . Highest education level: Not on file  Occupational History  . Not on file  Social Needs  . Financial resource strain: Not on file  . Food insecurity:    Worry: Not on file    Inability: Not on file  . Transportation  needs:    Medical: Not on file    Non-medical: Not on file  Tobacco Use  . Smoking status: Never Smoker  . Smokeless tobacco: Never Used  Substance and Sexual Activity  . Alcohol use: No  . Drug use: No  . Sexual  activity: Yes  Lifestyle  . Physical activity:    Days per week: Not on file    Minutes per session: Not on file  . Stress: Not on file  Relationships  . Social connections:    Talks on phone: Not on file    Gets together: Not on file    Attends religious service: Not on file    Active member of club or organization: Not on file    Attends meetings of clubs or organizations: Not on file    Relationship status: Not on file  . Intimate partner violence:    Fear of current or ex partner: Not on file    Emotionally abused: Not on file    Physically abused: Not on file    Forced sexual activity: Not on file  Other Topics Concern  . Not on file  Social History Narrative  . Not on file    Family History  Problem Relation Age of Onset  . Coronary artery disease Mother   . Polycythemia Mother   . Macular degeneration Mother   . Dementia Mother   . Coronary artery disease Father      Current Outpatient Medications:  .  aspirin EC 81 MG tablet, Take 81 mg by mouth daily. , Disp: , Rfl:  .  Cholecalciferol (VITAMIN D3) 50 MCG (2000 UT) capsule, Take 3 capsules daily for 3 months, then reduce to 1 capsule daily thereafter for Vitamin D Deficiency, Disp: , Rfl:  .  clobetasol ointment (TEMOVATE) 0.05 %, Apply topically., Disp: , Rfl:  .  colchicine 0.6 MG tablet, Take 0.6 mg by mouth daily. , Disp: , Rfl:  .  dutasteride (AVODART) 0.5 MG capsule, Take 0.5 mg by mouth every morning., Disp: , Rfl:  .  febuxostat (ULORIC) 40 MG tablet, Take 40 mg by mouth daily. , Disp: , Rfl:  .  fenofibrate (TRICOR) 48 MG tablet, Take by mouth., Disp: , Rfl:  .  glimepiride (AMARYL) 1 MG tablet, Take 2 mg by mouth daily with breakfast. , Disp: , Rfl: 0 .  hydrocortisone 2.5 % ointment, Apply 1 application topically daily. , Disp: , Rfl:  .  ketoconazole (NIZORAL) 2 % cream, Apply 1 application topically daily. , Disp: , Rfl:  .  lisinopril-hydrochlorothiazide (PRINZIDE,ZESTORETIC) 20-12.5 MG tablet,  Take 1 tablet by mouth every morning., Disp: , Rfl:  .  lovastatin (MEVACOR) 10 MG tablet, Take 10 mg by mouth 3 (three) times a week., Disp: , Rfl:  .  vitamin B-12 (CYANOCOBALAMIN) 1000 MCG tablet, Take 1,000 mcg by mouth daily. , Disp: , Rfl:   Physical exam:  Vitals:   03/02/18 1442  BP: 112/67  Pulse: 79  Resp: 18  Temp: (!) 97.4 F (36.3 C)  TempSrc: Tympanic  Weight: 224 lb 9.6 oz (101.9 kg)   Physical Exam Constitutional:      General: Dean Lynch is not in acute distress. HENT:     Head: Normocephalic and atraumatic.  Eyes:     Pupils: Pupils are equal, round, and reactive to light.  Neck:     Musculoskeletal: Normal range of motion.  Cardiovascular:     Rate and Rhythm: Normal rate and regular rhythm.  Heart sounds: Normal heart sounds.  Pulmonary:     Effort: Pulmonary effort is normal.     Breath sounds: Normal breath sounds.  Abdominal:     General: Bowel sounds are normal.     Palpations: Abdomen is soft.  Skin:    General: Skin is warm and dry.  Neurological:     Mental Status: Dean Lynch is alert and oriented to person, place, and time.      No flowsheet data found. CBC Latest Ref Rng & Units 09/01/2017  WBC 3.8 - 10.6 K/uL 7.1  Hemoglobin 13.0 - 18.0 g/dL 13.5  Hematocrit 40.0 - 52.0 % 36.6(L)  Platelets 150 - 440 K/uL 121(L)    Assessment and plan- Patient is a 68 y.o. male here for routine f/u of thrombocytopenia- ITP versus MDS  Recent labs from 02/25/2018 reviewed.  White count was 5.4, H&H 14.1/37.4 and a platelet count was 104.  Overall his platelet counts have remained stable between 100s to 110s over the last 5 years.  There has been some evidence of macrocytosis without overt anemia in the past.  Thrombocytopenia is likely due to ITP versus MDS given macrocytosis.  Dean Lynch does not require any treatment at this time and can be conservatively monitored.  Patient does not have a consistent primary care doctor.  I will therefore check a CBC with differential in 6  months in 1 year and see him back in 1 year's time   Visit Diagnosis 1. Thrombocytopenia ([place])      Dr. Randa Evens, MD, MPH [facility] at [facility] [phone] 03/02/2018 4:22 PM

## 2018-07-07 DIAGNOSIS — E113293 Type 2 diabetes mellitus with mild nonproliferative diabetic retinopathy without macular edema, bilateral: Secondary | ICD-10-CM | POA: Diagnosis not present

## 2018-08-26 DIAGNOSIS — J349 Unspecified disorder of nose and nasal sinuses: Secondary | ICD-10-CM | POA: Diagnosis not present

## 2018-08-26 DIAGNOSIS — N289 Disorder of kidney and ureter, unspecified: Secondary | ICD-10-CM | POA: Diagnosis not present

## 2018-08-26 DIAGNOSIS — D696 Thrombocytopenia, unspecified: Secondary | ICD-10-CM | POA: Diagnosis not present

## 2018-08-26 DIAGNOSIS — Z7189 Other specified counseling: Secondary | ICD-10-CM | POA: Diagnosis not present

## 2018-08-26 DIAGNOSIS — E7800 Pure hypercholesterolemia, unspecified: Secondary | ICD-10-CM | POA: Diagnosis not present

## 2018-08-26 DIAGNOSIS — E119 Type 2 diabetes mellitus without complications: Secondary | ICD-10-CM | POA: Diagnosis not present

## 2018-08-26 DIAGNOSIS — M1A49X0 Other secondary chronic gout, multiple sites, without tophus (tophi): Secondary | ICD-10-CM | POA: Diagnosis not present

## 2018-08-26 DIAGNOSIS — I10 Essential (primary) hypertension: Secondary | ICD-10-CM | POA: Diagnosis not present

## 2018-08-26 DIAGNOSIS — Z0000 Encounter for general adult medical examination without abnormal findings: Secondary | ICD-10-CM | POA: Diagnosis not present

## 2018-08-26 DIAGNOSIS — Z85820 Personal history of malignant melanoma of skin: Secondary | ICD-10-CM | POA: Diagnosis not present

## 2018-08-26 DIAGNOSIS — Z1329 Encounter for screening for other suspected endocrine disorder: Secondary | ICD-10-CM | POA: Diagnosis not present

## 2018-08-26 DIAGNOSIS — H9313 Tinnitus, bilateral: Secondary | ICD-10-CM | POA: Diagnosis not present

## 2018-08-26 DIAGNOSIS — Z23 Encounter for immunization: Secondary | ICD-10-CM | POA: Diagnosis not present

## 2018-08-26 DIAGNOSIS — E538 Deficiency of other specified B group vitamins: Secondary | ICD-10-CM | POA: Diagnosis not present

## 2018-08-26 DIAGNOSIS — N400 Enlarged prostate without lower urinary tract symptoms: Secondary | ICD-10-CM | POA: Diagnosis not present

## 2018-08-26 DIAGNOSIS — E559 Vitamin D deficiency, unspecified: Secondary | ICD-10-CM | POA: Diagnosis not present

## 2018-08-28 ENCOUNTER — Other Ambulatory Visit: Payer: Self-pay

## 2018-08-31 ENCOUNTER — Other Ambulatory Visit: Payer: Self-pay

## 2018-08-31 ENCOUNTER — Inpatient Hospital Stay: Payer: PPO | Attending: Oncology

## 2018-08-31 ENCOUNTER — Other Ambulatory Visit: Payer: Self-pay | Admitting: *Deleted

## 2018-08-31 DIAGNOSIS — E119 Type 2 diabetes mellitus without complications: Secondary | ICD-10-CM

## 2018-08-31 DIAGNOSIS — D696 Thrombocytopenia, unspecified: Secondary | ICD-10-CM | POA: Diagnosis not present

## 2018-08-31 LAB — CBC WITH DIFFERENTIAL/PLATELET
Abs Immature Granulocytes: 0.02 10*3/uL (ref 0.00–0.07)
Basophils Absolute: 0 10*3/uL (ref 0.0–0.1)
Basophils Relative: 1 %
Eosinophils Absolute: 0.2 10*3/uL (ref 0.0–0.5)
Eosinophils Relative: 3 %
HCT: 31.8 % — ABNORMAL LOW (ref 39.0–52.0)
Hemoglobin: 11.6 g/dL — ABNORMAL LOW (ref 13.0–17.0)
Immature Granulocytes: 0 %
Lymphocytes Relative: 27 %
Lymphs Abs: 1.4 10*3/uL (ref 0.7–4.0)
MCH: 35.7 pg — ABNORMAL HIGH (ref 26.0–34.0)
MCHC: 36.5 g/dL — ABNORMAL HIGH (ref 30.0–36.0)
MCV: 97.8 fL (ref 80.0–100.0)
Monocytes Absolute: 0.5 10*3/uL (ref 0.1–1.0)
Monocytes Relative: 9 %
Neutro Abs: 3.1 10*3/uL (ref 1.7–7.7)
Neutrophils Relative %: 60 %
Platelets: 93 10*3/uL — ABNORMAL LOW (ref 150–400)
RBC: 3.25 MIL/uL — ABNORMAL LOW (ref 4.22–5.81)
RDW: 12.5 % (ref 11.5–15.5)
WBC: 5.2 10*3/uL (ref 4.0–10.5)
nRBC: 0 % (ref 0.0–0.2)

## 2018-09-28 DIAGNOSIS — R748 Abnormal levels of other serum enzymes: Secondary | ICD-10-CM | POA: Diagnosis not present

## 2018-09-29 DIAGNOSIS — R748 Abnormal levels of other serum enzymes: Secondary | ICD-10-CM | POA: Diagnosis not present

## 2018-10-30 DIAGNOSIS — I10 Essential (primary) hypertension: Secondary | ICD-10-CM | POA: Diagnosis not present

## 2018-10-30 DIAGNOSIS — E1122 Type 2 diabetes mellitus with diabetic chronic kidney disease: Secondary | ICD-10-CM | POA: Diagnosis not present

## 2018-10-30 DIAGNOSIS — N1831 Chronic kidney disease, stage 3a: Secondary | ICD-10-CM | POA: Diagnosis not present

## 2018-10-30 DIAGNOSIS — D631 Anemia in chronic kidney disease: Secondary | ICD-10-CM | POA: Diagnosis not present

## 2018-11-11 ENCOUNTER — Other Ambulatory Visit (HOSPITAL_COMMUNITY): Payer: Self-pay | Admitting: Nephrology

## 2018-11-11 ENCOUNTER — Other Ambulatory Visit: Payer: Self-pay | Admitting: Nephrology

## 2018-11-11 DIAGNOSIS — N1831 Chronic kidney disease, stage 3a: Secondary | ICD-10-CM

## 2018-11-16 ENCOUNTER — Other Ambulatory Visit: Payer: Self-pay | Admitting: Nephrology

## 2018-11-16 DIAGNOSIS — N1831 Chronic kidney disease, stage 3a: Secondary | ICD-10-CM

## 2018-11-18 ENCOUNTER — Ambulatory Visit (HOSPITAL_COMMUNITY): Payer: PPO

## 2018-11-18 ENCOUNTER — Encounter (HOSPITAL_COMMUNITY): Payer: Self-pay

## 2018-11-19 ENCOUNTER — Other Ambulatory Visit: Payer: Self-pay

## 2018-11-19 ENCOUNTER — Ambulatory Visit
Admission: RE | Admit: 2018-11-19 | Discharge: 2018-11-19 | Disposition: A | Discharge status: Home / Self Care | Payer: PPO | Source: Ambulatory Visit | Attending: Nephrology | Admitting: Nephrology

## 2018-11-19 DIAGNOSIS — N1831 Chronic kidney disease, stage 3a: Secondary | ICD-10-CM | POA: Diagnosis not present

## 2018-11-19 DIAGNOSIS — I129 Hypertensive chronic kidney disease with stage 1 through stage 4 chronic kidney disease, or unspecified chronic kidney disease: Secondary | ICD-10-CM | POA: Diagnosis not present

## 2018-11-27 DIAGNOSIS — I10 Essential (primary) hypertension: Secondary | ICD-10-CM | POA: Diagnosis not present

## 2018-11-27 DIAGNOSIS — N400 Enlarged prostate without lower urinary tract symptoms: Secondary | ICD-10-CM | POA: Diagnosis not present

## 2018-11-27 DIAGNOSIS — N1831 Chronic kidney disease, stage 3a: Secondary | ICD-10-CM | POA: Diagnosis not present

## 2018-11-27 DIAGNOSIS — E1122 Type 2 diabetes mellitus with diabetic chronic kidney disease: Secondary | ICD-10-CM | POA: Diagnosis not present

## 2018-11-27 DIAGNOSIS — D631 Anemia in chronic kidney disease: Secondary | ICD-10-CM | POA: Diagnosis not present

## 2018-12-21 ENCOUNTER — Ambulatory Visit
Admission: RE | Admit: 2018-12-21 | Discharge: 2018-12-21 | Disposition: A | Discharge status: Home / Self Care | Payer: PPO | Source: Ambulatory Visit | Attending: Otolaryngology | Admitting: Otolaryngology

## 2018-12-21 ENCOUNTER — Other Ambulatory Visit: Payer: Self-pay

## 2018-12-21 ENCOUNTER — Ambulatory Visit
Admission: RE | Admit: 2018-12-21 | Discharge: 2018-12-21 | Disposition: A | Discharge status: Home / Self Care | Payer: PPO | Attending: Otolaryngology | Admitting: Otolaryngology

## 2018-12-21 ENCOUNTER — Other Ambulatory Visit: Payer: Self-pay | Admitting: Otolaryngology

## 2018-12-21 DIAGNOSIS — J329 Chronic sinusitis, unspecified: Secondary | ICD-10-CM

## 2018-12-21 DIAGNOSIS — J301 Allergic rhinitis due to pollen: Secondary | ICD-10-CM | POA: Diagnosis not present

## 2018-12-21 DIAGNOSIS — J328 Other chronic sinusitis: Secondary | ICD-10-CM | POA: Diagnosis not present

## 2019-01-20 ENCOUNTER — Ambulatory Visit (INDEPENDENT_AMBULATORY_CARE_PROVIDER_SITE_OTHER): Payer: PPO | Admitting: Urology

## 2019-01-20 ENCOUNTER — Other Ambulatory Visit: Payer: Self-pay

## 2019-01-20 ENCOUNTER — Encounter: Payer: Self-pay | Admitting: Urology

## 2019-01-20 VITALS — BP 117/68 | HR 70 | Ht 70.0 in | Wt 220.0 lb

## 2019-01-20 DIAGNOSIS — N400 Enlarged prostate without lower urinary tract symptoms: Secondary | ICD-10-CM | POA: Diagnosis not present

## 2019-01-20 LAB — BLADDER SCAN AMB NON-IMAGING: Scan Result: 18

## 2019-01-20 NOTE — Progress Notes (Signed)
01/20/2019 7:32 AM   Dean Lynch November 29, 1950 [id number]  Referring provider: Anthonette Legato, MD  Chief Complaint  Patient presents with  . Other    HPI: Dean Lynch is a 69 y.o. male seen at the request of Dr. Holley Raring for BPH.  He has a long history of BPH and has been on dutasteride.  He states he has not seen a urologist in the past and I can find no previous urologic records in epic.  He is on dutasteride daily and has mild lower urinary tract symptoms consisting of occasional frequency, decreased stream and nocturia x2.  IPSS completed today was 4/35 with a QoL rated 2/6.  He denies dysuria or gross hematuria. Denies flank, abdominal, pelvic or scrotal pain.   PMH: Past Medical History:  Diagnosis Date  . BPH (benign prostatic hyperplasia)   . Diabetes mellitus without complication ([place])   . Gout   . Hypercholesteremia   . Hypertension   . Skin cancer 2017   melanoa- removed at [facility]    Surgical History: Past Surgical History:  Procedure Laterality Date  . CATARACT EXTRACTION W/PHACO Right 09/27/2015   Procedure: CATARACT EXTRACTION PHACO AND INTRAOCULAR LENS PLACEMENT (IOC);  Surgeon: Leandrew Koyanagi, MD;  Location: [place];  Service: Ophthalmology;  Laterality: Right;  DIABETIC  . KNEE ARTHROSCOPY      Home Medications:  Allergies as of 01/20/2019      Reactions   Fenofibrate Micronized Other (See Comments)   Elevated Liver Enzymes      Medication List       Accurate as of January 20, 2019 11:59 PM. If you have any questions, ask your nurse or doctor.        aspirin EC 81 MG tablet Take 81 mg by mouth daily.   carvedilol 6.25 MG tablet Commonly known as: COREG Take 6.25 mg by mouth 2 (two) times daily.   clobetasol ointment 0.05 % Commonly known as: TEMOVATE Apply topically.   colchicine 0.6 MG tablet Take 0.6 mg by mouth daily.   dutasteride 0.5 MG capsule Commonly known as: AVODART Take 0.5 mg by mouth every morning.   febuxostat 40 MG tablet Commonly known as: ULORIC Take 40 mg by mouth daily.   fenofibrate 48 MG tablet Commonly known as: TRICOR Take by mouth.   glimepiride 1 MG tablet Commonly known as: AMARYL Take 2 mg by mouth daily with breakfast.   hydrocortisone 2.5 % ointment Apply 1 application topically daily.   ketoconazole 2 % cream Commonly known as: NIZORAL Apply 1 application topically daily.   lisinopril-hydrochlorothiazide 20-12.5 MG tablet Commonly known as: ZESTORETIC Take 1 tablet by mouth every morning.   lovastatin 10 MG tablet Commonly known as: MEVACOR Take 10 mg by mouth 3 (three) times a week.   vitamin B-12 1000 MCG tablet Commonly known as: CYANOCOBALAMIN Take 1,000 mcg by mouth daily.   Vitamin D3 50 MCG (2000 UT) capsule Take 3 capsules daily for 3 months, then reduce to 1 capsule daily thereafter for Vitamin D Deficiency       Allergies:  Allergies  Allergen Reactions  . Fenofibrate Micronized Other (See Comments)    Elevated Liver Enzymes    Family History: Family History  Problem Relation Age of Onset  . Coronary artery disease Mother   . Polycythemia Mother   . Macular degeneration Mother   . Dementia Mother   . Coronary artery disease Father     Social History:  reports that he has never  smoked. He has never used smokeless tobacco. He reports that he does not drink alcohol or use drugs.  ROS: UROLOGY Frequent Urination?: Yes Hard to postpone urination?: No Burning/pain with urination?: No Get up at night to urinate?: Yes Leakage of urine?: Yes Urine stream starts and stops?: No Trouble starting stream?: No Do you have to strain to urinate?: No Blood in urine?: No Urinary tract infection?: No Sexually transmitted disease?: No Injury to kidneys or bladder?: No Painful intercourse?: No Weak stream?: No Erection problems?: No Penile pain?: No  Gastrointestinal Nausea?: No Vomiting?: No Indigestion/heartburn?:  No Diarrhea?: No Constipation?: No  Constitutional Fever: No Night sweats?: No Weight loss?: No Fatigue?: No  Skin Skin rash/lesions?: No Itching?: No  Eyes Blurred vision?: No Double vision?: No  Ears/Nose/Throat Sore throat?: No Sinus problems?: No  Hematologic/Lymphatic Swollen glands?: No Easy bruising?: No  Cardiovascular Leg swelling?: No Chest pain?: No  Respiratory Cough?: No Shortness of breath?: No  Endocrine Excessive thirst?: No  Musculoskeletal Back pain?: No Joint pain?: No  Neurological Headaches?: No Dizziness?: No  Psychologic Depression?: No Anxiety?: No  Physical Exam: BP 117/68   Pulse 70   Ht 5\' 10"  (1.778 m)   Wt 220 lb (99.8 kg)   BMI 31.57 kg/m   Constitutional:  Alert and oriented, No acute distress. HEENT: [place] AT, moist mucus membranes.  Trachea midline, no masses. Cardiovascular: No clubbing, cyanosis, or edema. Respiratory: Normal respiratory effort, no increased work of breathing. GU: Prostate 60 g, smooth without nodules Skin: No rashes, bruises or suspicious lesions. Neurologic: Grossly intact, no focal deficits, moving all 4 extremities. Psychiatric: Normal mood and affect.    Assessment & Plan:    - Benign prostatic hyperplasia without lower urinary tract symptoms PVR by bladder scan was 18 mL.  His voiding symptoms are not bothersome and he will continue dutasteride.  Will have him follow-up annually.  Dean Lynch, [place] [address] [place], [place] [address] [phone]

## 2019-01-21 ENCOUNTER — Encounter: Payer: Self-pay | Admitting: Urology

## 2019-01-21 LAB — URINALYSIS, COMPLETE
Bilirubin, UA: NEGATIVE
Ketones, UA: NEGATIVE
Leukocytes,UA: NEGATIVE
Nitrite, UA: NEGATIVE
Protein,UA: NEGATIVE
RBC, UA: NEGATIVE
Specific Gravity, UA: 1.025 (ref 1.005–1.030)
Urobilinogen, Ur: 2 mg/dL — ABNORMAL HIGH (ref 0.2–1.0)
pH, UA: 5 (ref 5.0–7.5)

## 2019-01-21 LAB — MICROSCOPIC EXAMINATION: RBC, Urine: NONE SEEN /hpf (ref 0–2)

## 2019-01-22 DIAGNOSIS — H903 Sensorineural hearing loss, bilateral: Secondary | ICD-10-CM | POA: Diagnosis not present

## 2019-01-26 DIAGNOSIS — H903 Sensorineural hearing loss, bilateral: Secondary | ICD-10-CM | POA: Diagnosis not present

## 2019-01-28 DIAGNOSIS — D631 Anemia in chronic kidney disease: Secondary | ICD-10-CM | POA: Diagnosis not present

## 2019-01-28 DIAGNOSIS — I10 Essential (primary) hypertension: Secondary | ICD-10-CM | POA: Diagnosis not present

## 2019-01-28 DIAGNOSIS — E1122 Type 2 diabetes mellitus with diabetic chronic kidney disease: Secondary | ICD-10-CM | POA: Diagnosis not present

## 2019-01-28 DIAGNOSIS — N1831 Chronic kidney disease, stage 3a: Secondary | ICD-10-CM | POA: Diagnosis not present

## 2019-03-05 ENCOUNTER — Other Ambulatory Visit: Payer: Self-pay

## 2019-03-05 ENCOUNTER — Encounter: Payer: Self-pay | Admitting: Oncology

## 2019-03-05 ENCOUNTER — Inpatient Hospital Stay: Payer: PPO | Attending: Oncology | Admitting: Oncology

## 2019-03-05 ENCOUNTER — Inpatient Hospital Stay: Payer: PPO

## 2019-03-05 VITALS — BP 103/62 | HR 83 | Temp 97.2°F | Resp 16 | Wt 223.5 lb

## 2019-03-05 DIAGNOSIS — D696 Thrombocytopenia, unspecified: Secondary | ICD-10-CM

## 2019-03-05 DIAGNOSIS — E119 Type 2 diabetes mellitus without complications: Secondary | ICD-10-CM | POA: Diagnosis not present

## 2019-03-05 DIAGNOSIS — D7589 Other specified diseases of blood and blood-forming organs: Secondary | ICD-10-CM | POA: Diagnosis not present

## 2019-03-05 DIAGNOSIS — I10 Essential (primary) hypertension: Secondary | ICD-10-CM | POA: Diagnosis not present

## 2019-03-05 DIAGNOSIS — M109 Gout, unspecified: Secondary | ICD-10-CM | POA: Insufficient documentation

## 2019-03-05 DIAGNOSIS — D539 Nutritional anemia, unspecified: Secondary | ICD-10-CM

## 2019-03-05 DIAGNOSIS — D649 Anemia, unspecified: Secondary | ICD-10-CM | POA: Diagnosis not present

## 2019-03-05 DIAGNOSIS — E785 Hyperlipidemia, unspecified: Secondary | ICD-10-CM | POA: Insufficient documentation

## 2019-03-05 LAB — CBC WITH DIFFERENTIAL/PLATELET
Abs Immature Granulocytes: 0.01 10*3/uL (ref 0.00–0.07)
Basophils Absolute: 0 10*3/uL (ref 0.0–0.1)
Basophils Relative: 0 %
Eosinophils Absolute: 0.1 10*3/uL (ref 0.0–0.5)
Eosinophils Relative: 3 %
HCT: 31.3 % — ABNORMAL LOW (ref 39.0–52.0)
Hemoglobin: 11.1 g/dL — ABNORMAL LOW (ref 13.0–17.0)
Immature Granulocytes: 0 %
Lymphocytes Relative: 34 %
Lymphs Abs: 1.6 10*3/uL (ref 0.7–4.0)
MCH: 36.6 pg — ABNORMAL HIGH (ref 26.0–34.0)
MCHC: 35.5 g/dL (ref 30.0–36.0)
MCV: 103.3 fL — ABNORMAL HIGH (ref 80.0–100.0)
Monocytes Absolute: 0.4 10*3/uL (ref 0.1–1.0)
Monocytes Relative: 9 %
Neutro Abs: 2.5 10*3/uL (ref 1.7–7.7)
Neutrophils Relative %: 54 %
Platelets: 80 10*3/uL — ABNORMAL LOW (ref 150–400)
RBC: 3.03 MIL/uL — ABNORMAL LOW (ref 4.22–5.81)
RDW: 13.1 % (ref 11.5–15.5)
WBC: 4.7 10*3/uL (ref 4.0–10.5)
nRBC: 0 % (ref 0.0–0.2)

## 2019-03-05 NOTE — Progress Notes (Signed)
Pt states that in last couple of months he has had 3 nose bleeds and he held his nose and used kleenex and it quickly stops. That is new for him

## 2019-03-08 NOTE — Progress Notes (Signed)
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 He has been referred to [place] for evaluation and management of thrombocytopenia. Recent CBC from 07/16/2016 showed white count of 7.1, H&H of 9/38.2 with an MCV of 97 and a platelet count of 110. On review of his prior CBCs since 2015 his platelet counts have always been between [phone]'s. Recent CMP was within normal limits except for a mildly elevated total bilirubin of 1.5. HIV and hepatitis C testing was negative. TSH was within normal limits.  He denies any bleeding, burising other than occasional bruises on his forearms. No OTC medications or herbal supplements.  Results of bloodwork from 08/05/2016 were as follows: CBC showed white count of 6.4, H&H of 13.8/39.2 with an MCV of 100.7 and a platelet count of 101. Peripheral blood smear review showed mild thrombocytopenia without clumping. RBC show mild macrocytosis. Leukocyte count and differential within normal limits. No abnormal or immature cells are seen. B12 and folate were within normal limits  Interval history-patient is doing well.  He denies any bleeding or bruising.  He does have ongoing urinary symptoms including frequency and nocturia for which he sees urology.  ECOG PS- 1 Pain scale- 0   Review of systems- Review of Systems  Constitutional: Positive for malaise/fatigue. Negative for chills, fever and weight loss.  HENT: Negative for congestion, ear  discharge and nosebleeds.   Eyes: Negative for blurred vision.  Respiratory: Negative for cough, hemoptysis, sputum production, shortness of breath and wheezing.   Cardiovascular: Negative for chest pain, palpitations, orthopnea and claudication.  Gastrointestinal: Negative for abdominal pain, blood in stool, constipation, diarrhea, heartburn, melena, nausea and vomiting.  Genitourinary: Negative for dysuria, flank pain, frequency, hematuria and urgency.  Musculoskeletal: Negative for back pain, joint pain and myalgias.  Skin: Negative for rash.  Neurological: Negative for dizziness, tingling, focal weakness, seizures, weakness and headaches.  Endo/Heme/Allergies: Does not bruise/bleed easily.  Psychiatric/Behavioral: Negative for depression and suicidal ideas. The patient does not have insomnia.       Allergies  Allergen Reactions  . Fenofibrate Micronized Other (See Comments)    Elevated Liver Enzymes     Past Medical History:  Diagnosis Date  . BPH (benign prostatic hyperplasia)   . Diabetes mellitus without complication ([place])   . Gout   . Hypercholesteremia   . Hypertension   . Skin cancer 2017   melanoa- removed at [facility]     Past Surgical History:  Procedure Laterality Date  . CATARACT EXTRACTION W/PHACO Right 09/27/2015   Procedure: CATARACT EXTRACTION PHACO AND INTRAOCULAR LENS PLACEMENT (IOC);  Surgeon: Leandrew Koyanagi, MD;  Location: [place];  Service: Ophthalmology;  Laterality: Right;  DIABETIC  . KNEE ARTHROSCOPY      Social History   Socioeconomic History  . Marital status: Married    Spouse name: Not on file  . Number of children: Not on file  . Years of education: Not on file  . Highest education level: Not on file  Occupational History  . Not on file  Tobacco  Use  . Smoking status: Never Smoker  . Smokeless tobacco: Never Used  Substance and Sexual Activity  . Alcohol use: No  . Drug use: No  . Sexual activity: Yes  Other Topics  Concern  . Not on file  Social History Narrative  . Not on file   Social Determinants of Health   Financial Resource Strain:   . Difficulty of Paying Living Expenses: Not on file  Food Insecurity:   . Worried About Charity fundraiser in the Last Year: Not on file  . Ran Out of Food in the Last Year: Not on file  Transportation Needs:   . Lack of Transportation (Medical): Not on file  . Lack of Transportation (Non-Medical): Not on file  Physical Activity:   . Days of Exercise per Week: Not on file  . Minutes of Exercise per Session: Not on file  Stress:   . Feeling of Stress : Not on file  Social Connections:   . Frequency of Communication with Friends and Family: Not on file  . Frequency of Social Gatherings with Friends and Family: Not on file  . Attends Religious Services: Not on file  . Active Member of Clubs or Organizations: Not on file  . Attends Archivist Meetings: Not on file  . Marital Status: Not on file  Intimate Partner Violence:   . Fear of Current or Ex-Partner: Not on file  . Emotionally Abused: Not on file  . Physically Abused: Not on file  . Sexually Abused: Not on file    Family History  Problem Relation Age of Onset  . Coronary artery disease Mother   . Polycythemia Mother   . Macular degeneration Mother   . Dementia Mother   . Coronary artery disease Father      Current Outpatient Medications:  .  aspirin EC 81 MG tablet, Take 81 mg by mouth daily. , Disp: , Rfl:  .  carvedilol (COREG) 6.25 MG tablet, Take 6.25 mg by mouth 2 (two) times daily., Disp: , Rfl:  .  Cholecalciferol (VITAMIN D3) 50 MCG (2000 UT) capsule, Take 3 capsules daily for 3 months, then reduce to 1 capsule daily thereafter for Vitamin D Deficiency, Disp: , Rfl:  .  clobetasol ointment (TEMOVATE) 4.98 %, Apply 1 application topically 2 (two) times daily as needed. , Disp: , Rfl:  .  colchicine 0.6 MG tablet, Take 0.6 mg by mouth daily. , Disp: , Rfl:  .  dutasteride  (AVODART) 0.5 MG capsule, Take 0.5 mg by mouth every morning., Disp: , Rfl:  .  febuxostat (ULORIC) 40 MG tablet, Take 40 mg by mouth daily. , Disp: , Rfl:  .  fenofibrate (TRICOR) 48 MG tablet, Take by mouth., Disp: , Rfl:  .  glimepiride (AMARYL) 1 MG tablet, Take 2 mg by mouth daily with breakfast. , Disp: , Rfl: 0 .  hydrocortisone 2.5 % ointment, Apply 1 application topically daily. , Disp: , Rfl:  .  ketoconazole (NIZORAL) 2 % cream, Apply 1 application topically daily. , Disp: , Rfl:  .  lisinopril-hydrochlorothiazide (PRINZIDE,ZESTORETIC) 20-12.5 MG tablet, Take 1 tablet by mouth every morning., Disp: , Rfl:  .  lovastatin (MEVACOR) 10 MG tablet, Take 10 mg by mouth 3 (three) times a week., Disp: , Rfl:   Physical exam:  Vitals:   03/05/19 1404  BP: 103/62  Pulse: 83  Resp: 16  Temp: (!) 97.2 F (36.2 C)  TempSrc: Tympanic  SpO2: 100%  Weight: 223  lb 8 oz (101.4 kg)   Physical Exam Constitutional:      General: He is not in acute distress. HENT:     Head: Normocephalic and atraumatic.  Eyes:     Pupils: Pupils are equal, round, and reactive to light.  Cardiovascular:     Rate and Rhythm: Normal rate and regular rhythm.     Heart sounds: Normal heart sounds.  Pulmonary:     Effort: Pulmonary effort is normal.     Breath sounds: Normal breath sounds.  Abdominal:     General: Bowel sounds are normal.     Palpations: Abdomen is soft.  Musculoskeletal:     Cervical back: Normal range of motion.  Skin:    General: Skin is warm and dry.  Neurological:     Mental Status: He is alert and oriented to person, place, and time.      No flowsheet data found. CBC Latest Ref Rng & Units 03/05/2019  WBC 4.0 - 10.5 K/uL 4.7  Hemoglobin 13.0 - 17.0 g/dL 11.1(L)  Hematocrit 39.0 - 52.0 % 31.3(L)  Platelets 150 - 400 K/uL 80(L)     Assessment and plan- Patient is a 69 y.o. 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  His hemoglobin has gradually drifted from 13-11 over the last 2 years and he has persistent macrocytosis.  Platelet counts have also slowly drifted down from the 100s to 80 today.  His counts are not low enough to warrant a bone marrow biopsy at this time.  With regards to his anemia: I will plan to check reticulocyte count, ferritin and iron studies, B12 and folate, myeloma panel, serum free light chains and TSH in 3 months.  I will see him back in 6 months with CBC ferritin iron studies B12 and folate for a video visit   Visit Diagnosis 1. Thrombocytopenia ([place])   2. Macrocytic anemia      Dr. Randa Evens, MD, MPH [facility] at [facility] [phone] 03/08/2019 1:04 PM

## 2019-03-09 DIAGNOSIS — E538 Deficiency of other specified B group vitamins: Secondary | ICD-10-CM | POA: Diagnosis not present

## 2019-03-09 DIAGNOSIS — N528 Other male erectile dysfunction: Secondary | ICD-10-CM | POA: Diagnosis not present

## 2019-03-09 DIAGNOSIS — Z7189 Other specified counseling: Secondary | ICD-10-CM | POA: Diagnosis not present

## 2019-03-09 DIAGNOSIS — N400 Enlarged prostate without lower urinary tract symptoms: Secondary | ICD-10-CM | POA: Diagnosis not present

## 2019-03-09 DIAGNOSIS — E7800 Pure hypercholesterolemia, unspecified: Secondary | ICD-10-CM | POA: Diagnosis not present

## 2019-03-09 DIAGNOSIS — E559 Vitamin D deficiency, unspecified: Secondary | ICD-10-CM | POA: Diagnosis not present

## 2019-03-09 DIAGNOSIS — E119 Type 2 diabetes mellitus without complications: Secondary | ICD-10-CM | POA: Diagnosis not present

## 2019-03-09 DIAGNOSIS — I10 Essential (primary) hypertension: Secondary | ICD-10-CM | POA: Diagnosis not present

## 2019-03-09 DIAGNOSIS — Z79899 Other long term (current) drug therapy: Secondary | ICD-10-CM | POA: Diagnosis not present

## 2019-04-20 ENCOUNTER — Encounter: Payer: Self-pay | Admitting: Oncology

## 2019-04-21 ENCOUNTER — Encounter: Payer: Self-pay | Admitting: Oncology

## 2019-04-21 ENCOUNTER — Other Ambulatory Visit: Payer: Self-pay | Admitting: *Deleted

## 2019-04-21 DIAGNOSIS — D539 Nutritional anemia, unspecified: Secondary | ICD-10-CM

## 2019-04-21 DIAGNOSIS — D696 Thrombocytopenia, unspecified: Secondary | ICD-10-CM

## 2019-04-21 NOTE — Telephone Encounter (Signed)
Called pt back and asked him when he would like to get labs done.and he wanted Dean Lynch. Late am. Made appt for 4/15 at 11:30 and all labs entered based on dr Janese Banks notes for specific labs

## 2019-04-22 ENCOUNTER — Inpatient Hospital Stay: Payer: PPO | Attending: Oncology

## 2019-04-22 ENCOUNTER — Other Ambulatory Visit: Payer: Self-pay

## 2019-04-22 DIAGNOSIS — D696 Thrombocytopenia, unspecified: Secondary | ICD-10-CM | POA: Diagnosis not present

## 2019-04-22 DIAGNOSIS — D539 Nutritional anemia, unspecified: Secondary | ICD-10-CM | POA: Insufficient documentation

## 2019-04-22 LAB — RETICULOCYTES
Immature Retic Fract: 4 % (ref 2.3–15.9)
RBC.: 3.21 MIL/uL — ABNORMAL LOW (ref 4.22–5.81)
Retic Count, Absolute: 74.2 10*3/uL (ref 19.0–186.0)
Retic Ct Pct: 2.3 % (ref 0.4–3.1)

## 2019-04-22 LAB — CBC WITH DIFFERENTIAL/PLATELET
Abs Immature Granulocytes: 0.01 10*3/uL (ref 0.00–0.07)
Basophils Absolute: 0 10*3/uL (ref 0.0–0.1)
Basophils Relative: 0 %
Eosinophils Absolute: 0.2 10*3/uL (ref 0.0–0.5)
Eosinophils Relative: 3 %
Hemoglobin: 11.8 g/dL — ABNORMAL LOW (ref 13.0–17.0)
Immature Granulocytes: 0 %
Lymphocytes Relative: 26 %
Lymphs Abs: 1.4 10*3/uL (ref 0.7–4.0)
Monocytes Absolute: 0.4 10*3/uL (ref 0.1–1.0)
Monocytes Relative: 8 %
Neutro Abs: 3.3 10*3/uL (ref 1.7–7.7)
Neutrophils Relative %: 63 %
Platelets: 97 10*3/uL — ABNORMAL LOW (ref 150–400)
WBC: 5.2 10*3/uL (ref 4.0–10.5)
nRBC: 0 % (ref 0.0–0.2)

## 2019-04-22 LAB — FERRITIN: Ferritin: 423 ng/mL — ABNORMAL HIGH (ref 24–336)

## 2019-04-22 LAB — IRON AND TIBC
Iron: 193 ug/dL — ABNORMAL HIGH (ref 45–182)
Saturation Ratios: 66 % — ABNORMAL HIGH (ref 17.9–39.5)
TIBC: 293 ug/dL (ref 250–450)
UIBC: 100 ug/dL

## 2019-04-22 LAB — TSH: TSH: 2.178 u[IU]/mL (ref 0.350–4.500)

## 2019-04-22 LAB — VITAMIN B12: Vitamin B-12: 709 pg/mL (ref 180–914)

## 2019-04-22 LAB — FOLATE: Folate: 21.1 ng/mL (ref >5.9)

## 2019-04-23 ENCOUNTER — Encounter: Payer: Self-pay | Admitting: Oncology

## 2019-04-23 LAB — KAPPA/LAMBDA LIGHT CHAINS
Kappa free light chain: 41.5 mg/L — ABNORMAL HIGH (ref 3.3–19.4)
Kappa, lambda light chain ratio: 1.02 (ref 0.26–1.65)
Lambda free light chains: 40.7 mg/L — ABNORMAL HIGH (ref 5.7–26.3)

## 2019-04-26 LAB — MULTIPLE MYELOMA PANEL, SERUM
Albumin SerPl Elph-Mcnc: 3.4 g/dL (ref 2.9–4.4)
Albumin/Glob SerPl: 1.5 (ref 0.7–1.7)
Alpha 1: 0.2 g/dL (ref 0.0–0.4)
Alpha2 Glob SerPl Elph-Mcnc: 0.5 g/dL (ref 0.4–1.0)
B-Globulin SerPl Elph-Mcnc: 0.8 g/dL (ref 0.7–1.3)
Gamma Glob SerPl Elph-Mcnc: 0.9 g/dL (ref 0.4–1.8)
Globulin, Total: 2.4 g/dL (ref 2.2–3.9)
IgA: 218 mg/dL (ref 61–437)
IgG (Immunoglobin G), Serum: 946 mg/dL (ref 603–1613)
IgM (Immunoglobulin M), Srm: 123 mg/dL (ref 20–172)
Total Protein ELP: 5.8 g/dL — ABNORMAL LOW (ref 6.0–8.5)

## 2019-04-26 NOTE — Telephone Encounter (Signed)
Dean Lynch- can you move his appointment to august? Will need labs cbc with diff, ferritin and iron studies and see me same day

## 2019-04-26 NOTE — Telephone Encounter (Signed)
Cancel may lab work

## 2019-06-02 ENCOUNTER — Other Ambulatory Visit: Payer: PPO

## 2019-06-03 DIAGNOSIS — E1122 Type 2 diabetes mellitus with diabetic chronic kidney disease: Secondary | ICD-10-CM | POA: Diagnosis not present

## 2019-06-03 DIAGNOSIS — N1832 Chronic kidney disease, stage 3b: Secondary | ICD-10-CM | POA: Diagnosis not present

## 2019-06-03 DIAGNOSIS — I10 Essential (primary) hypertension: Secondary | ICD-10-CM | POA: Diagnosis not present

## 2019-06-03 DIAGNOSIS — D631 Anemia in chronic kidney disease: Secondary | ICD-10-CM | POA: Diagnosis not present

## 2019-08-23 DIAGNOSIS — B351 Tinea unguium: Secondary | ICD-10-CM | POA: Diagnosis not present

## 2019-08-23 DIAGNOSIS — L03032 Cellulitis of left toe: Secondary | ICD-10-CM | POA: Diagnosis not present

## 2019-08-23 DIAGNOSIS — E119 Type 2 diabetes mellitus without complications: Secondary | ICD-10-CM | POA: Diagnosis not present

## 2019-08-23 DIAGNOSIS — L600 Ingrowing nail: Secondary | ICD-10-CM | POA: Diagnosis not present

## 2019-08-23 DIAGNOSIS — M79675 Pain in left toe(s): Secondary | ICD-10-CM | POA: Diagnosis not present

## 2019-09-01 ENCOUNTER — Other Ambulatory Visit: Payer: Self-pay

## 2019-09-01 ENCOUNTER — Inpatient Hospital Stay: Payer: PPO | Attending: Oncology

## 2019-09-01 DIAGNOSIS — E785 Hyperlipidemia, unspecified: Secondary | ICD-10-CM | POA: Diagnosis not present

## 2019-09-01 DIAGNOSIS — E119 Type 2 diabetes mellitus without complications: Secondary | ICD-10-CM | POA: Diagnosis not present

## 2019-09-01 DIAGNOSIS — D696 Thrombocytopenia, unspecified: Secondary | ICD-10-CM

## 2019-09-01 DIAGNOSIS — R5383 Other fatigue: Secondary | ICD-10-CM | POA: Diagnosis not present

## 2019-09-01 DIAGNOSIS — I10 Essential (primary) hypertension: Secondary | ICD-10-CM | POA: Diagnosis not present

## 2019-09-01 DIAGNOSIS — D61818 Other pancytopenia: Secondary | ICD-10-CM | POA: Diagnosis not present

## 2019-09-01 DIAGNOSIS — D539 Nutritional anemia, unspecified: Secondary | ICD-10-CM

## 2019-09-01 LAB — FERRITIN: Ferritin: 295 ng/mL (ref 24–336)

## 2019-09-01 LAB — CBC WITH DIFFERENTIAL/PLATELET
Abs Immature Granulocytes: 0.01 10*3/uL (ref 0.00–0.07)
Basophils Absolute: 0 10*3/uL (ref 0.0–0.1)
Basophils Relative: 1 %
Eosinophils Absolute: 0.1 10*3/uL (ref 0.0–0.5)
Eosinophils Relative: 3 %
Hemoglobin: 11.9 g/dL — ABNORMAL LOW (ref 13.0–17.0)
Immature Granulocytes: 0 %
Lymphocytes Relative: 30 %
Lymphs Abs: 1.6 10*3/uL (ref 0.7–4.0)
Monocytes Absolute: 0.4 10*3/uL (ref 0.1–1.0)
Monocytes Relative: 8 %
Neutro Abs: 3.3 10*3/uL (ref 1.7–7.7)
Neutrophils Relative %: 58 %
Platelets: 101 10*3/uL — ABNORMAL LOW (ref 150–400)
WBC: 5.5 10*3/uL (ref 4.0–10.5)
nRBC: 0 % (ref 0.0–0.2)

## 2019-09-01 LAB — IRON AND TIBC
Iron: 95 ug/dL (ref 45–182)
Saturation Ratios: 35 % (ref 17.9–39.5)
TIBC: 269 ug/dL (ref 250–450)
UIBC: 174 ug/dL

## 2019-09-03 ENCOUNTER — Encounter: Payer: Self-pay | Admitting: Oncology

## 2019-09-03 ENCOUNTER — Inpatient Hospital Stay (HOSPITAL_BASED_OUTPATIENT_CLINIC_OR_DEPARTMENT_OTHER): Payer: PPO | Admitting: Oncology

## 2019-09-03 DIAGNOSIS — D696 Thrombocytopenia, unspecified: Secondary | ICD-10-CM

## 2019-09-03 DIAGNOSIS — D539 Nutritional anemia, unspecified: Secondary | ICD-10-CM

## 2019-09-03 NOTE — Progress Notes (Signed)
Pt has no bleeding from urine or stool. He does get bruising under the skin on his arms when he bumps into things. He is eating and drinking good, goo BM. No concerns at this time per wife and she speaking to him for answers to my questions

## 2019-09-06 NOTE — Progress Notes (Signed)
I connected with Dean Lynch on 09/06/19 at  2:30 PM EDT by video enabled telemedicine visit and verified that I am speaking with the correct person using two identifiers.   I discussed the limitations, risks, security and privacy concerns of performing an evaluation and management service by telemedicine and the availability of in-person appointments. I also discussed with the patient that there may be a patient responsible charge related to this service. The patient expressed understanding and agreed to proceed.  Other persons participating in the visit and their role in the encounter:  none  Patient's location:  home Provider's location:  work  Risk analyst Complaint:  Routine f/u of pancytopenia possible MDS  History of present illness: patient is a 69 year old male with a past medical history significant for hypertension, hyperlipidemia and type 2 diabetes and other medical problems. He has been referred to [place] for evaluation and management of thrombocytopenia. Recent CBC from 07/16/2016 showed white count of 7.1, H&H of 9/38.2 with an MCV of 97 and a platelet count of 110. On review of his prior CBCs since 2015 his platelet counts have always been between [phone]. Recent CMP was within normal limits except for a mildly elevated total bilirubin of 1.5. HIV and hepatitis C testing was negative. TSH was within normal limits.  He denies any bleeding, burising other than occasional bruises on his forearms. No OTC medications or herbal supplements.  Extensive anemia work-up Showed normal B12, folate, iron studies although iron saturation was elevated at 66%.  TSH was normal.  Myeloma panel was unremarkable.  Serum free light chain ratio was normal.  Reticulocyte count was 2.3%.  CBC showed white count of 5.2, hemoglobin of 11.8 and a platelet count of 97.  Hematocrit and MCV has not been reported due to presence of interfering substance although historically patient has had longstanding macrocytosis.   On 04/22/2019  Interval history: Patient reports some fatigue.  Denies any bleeding or bruising.  Denies any recurrent infections or hospitalizations.   Review of Systems  Constitutional: Positive for malaise/fatigue. Negative for chills, fever and weight loss.  HENT: Negative for congestion, ear discharge and nosebleeds.   Eyes: Negative for blurred vision.  Respiratory: Negative for cough, hemoptysis, sputum production, shortness of breath and wheezing.   Cardiovascular: Negative for chest pain, palpitations, orthopnea and claudication.  Gastrointestinal: Negative for abdominal pain, blood in stool, constipation, diarrhea, heartburn, melena, nausea and vomiting.  Genitourinary: Negative for dysuria, flank pain, frequency, hematuria and urgency.  Musculoskeletal: Negative for back pain, joint pain and myalgias.  Skin: Negative for rash.  Neurological: Negative for dizziness, tingling, focal weakness, seizures, weakness and headaches.  Endo/Heme/Allergies: Does not bruise/bleed easily.  Psychiatric/Behavioral: Negative for depression and suicidal ideas. The patient does not have insomnia.     Allergies  Allergen Reactions  . Fenofibrate Micronized Other (See Comments)    Elevated Liver Enzymes    Past Medical History:  Diagnosis Date  . BPH (benign prostatic hyperplasia)   . Diabetes mellitus without complication ([place])   . Gout   . Hypercholesteremia   . Hypertension   . Skin cancer 2017   melanoa- removed at [facility]    Past Surgical History:  Procedure Laterality Date  . CATARACT EXTRACTION W/PHACO Right 09/27/2015   Procedure: CATARACT EXTRACTION PHACO AND INTRAOCULAR LENS PLACEMENT (IOC);  Surgeon: Leandrew Koyanagi, MD;  Location: [place];  Service: Ophthalmology;  Laterality: Right;  DIABETIC  . KNEE ARTHROSCOPY      Social History   Socioeconomic History  .  Marital status: Married    Spouse name: Not on file  . Number of children: Not on file  . Years  of education: Not on file  . Highest education level: Not on file  Occupational History  . Not on file  Tobacco Use  . Smoking status: Never Smoker  . Smokeless tobacco: Never Used  Vaping Use  . Vaping Use: Never used  Substance and Sexual Activity  . Alcohol use: No  . Drug use: No  . Sexual activity: Yes  Other Topics Concern  . Not on file  Social History Narrative  . Not on file   Social Determinants of Health   Financial Resource Strain:   . Difficulty of Paying Living Expenses: Not on file  Food Insecurity:   . Worried About Charity fundraiser in the Last Year: Not on file  . Ran Out of Food in the Last Year: Not on file  Transportation Needs:   . Lack of Transportation (Medical): Not on file  . Lack of Transportation (Non-Medical): Not on file  Physical Activity:   . Days of Exercise per Week: Not on file  . Minutes of Exercise per Session: Not on file  Stress:   . Feeling of Stress : Not on file  Social Connections:   . Frequency of Communication with Friends and Family: Not on file  . Frequency of Social Gatherings with Friends and Family: Not on file  . Attends Religious Services: Not on file  . Active Member of Clubs or Organizations: Not on file  . Attends Archivist Meetings: Not on file  . Marital Status: Not on file  Intimate Partner Violence:   . Fear of Current or Ex-Partner: Not on file  . Emotionally Abused: Not on file  . Physically Abused: Not on file  . Sexually Abused: Not on file    Family History  Problem Relation Age of Onset  . Coronary artery disease Mother   . Polycythemia Mother   . Macular degeneration Mother   . Dementia Mother   . Coronary artery disease Father      Current Outpatient Medications:  .  aspirin EC 81 MG tablet, Take 81 mg by mouth daily. , Disp: , Rfl:  .  carvedilol (COREG) 6.25 MG tablet, Take 6.25 mg by mouth 2 (two) times daily., Disp: , Rfl:  .  Cholecalciferol (VITAMIN D3) 50 MCG (2000 UT)  capsule, Take 2,000 Units by mouth daily. , Disp: , Rfl:  .  clobetasol ointment (TEMOVATE) 8.93 %, Apply 1 application topically 2 (two) times daily as needed. , Disp: , Rfl:  .  colchicine 0.6 MG tablet, Take 0.6 mg by mouth daily. , Disp: , Rfl:  .  dutasteride (AVODART) 0.5 MG capsule, Take 0.5 mg by mouth every morning., Disp: , Rfl:  .  febuxostat (ULORIC) 40 MG tablet, Take 40 mg by mouth daily. , Disp: , Rfl:  .  glimepiride (AMARYL) 1 MG tablet, Take 2 mg by mouth daily with breakfast. , Disp: , Rfl: 0 .  hydrocortisone 2.5 % ointment, Apply 1 application topically daily. , Disp: , Rfl:  .  ketoconazole (NIZORAL) 2 % cream, Apply 1 application topically daily. , Disp: , Rfl:  .  lisinopril-hydrochlorothiazide (PRINZIDE,ZESTORETIC) 20-12.5 MG tablet, Take 1 tablet by mouth every morning., Disp: , Rfl:  .  lovastatin (MEVACOR) 10 MG tablet, Take 10 mg by mouth 3 (three) times a week., Disp: , Rfl:  .  Omega-3 Fatty Acids (  FISH OIL) 1000 MG CAPS, Take 1 capsule by mouth daily., Disp: , Rfl:   No results found.  No images are attached to the encounter.   No flowsheet data found. CBC Latest Ref Rng & Units 09/01/2019  WBC 4.0 - 10.5 K/uL 5.5  Hemoglobin 13.0 - 17.0 g/dL 11.9(L)  Hematocrit 39 - 52 % RESULTS UNAVAILABLE DUE TO INTERFERING SUBSTANCE  Platelets 150 - 400 K/uL 101(L)     Observation/objective: Appears in no acute distress over video visit today.  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  Platelets are stable in the 90s 200s.  Her white cell count is normal with a normal differential.  Given the stability of his blood counts patient does not require a bone marrow biopsy at this time.  If he develops any worsening thrombocytopenia or  anemia in the future I will consider getting a bone marrow biopsy.  Follow-up instructions: CBC with differential and CMP in 3 in 6 months and I will see him in person in 6 months with ferritin and iron studies and B12  I discussed the assessment and treatment plan with the patient. The patient was provided an opportunity to ask questions and all were answered. The patient agreed with the plan and demonstrated an understanding of the instructions.   The patient was advised to call back or seek an in-person evaluation if the symptoms worsen or if the condition fails to improve as anticipated.    Visit Diagnosis: 1. Thrombocytopenia ([place])   2. Macrocytic anemia     Dr. Randa Evens, MD, MPH [facility] at [facility] Tel- [phone] 09/06/2019 8:14 AM

## 2019-09-09 DIAGNOSIS — Z0000 Encounter for general adult medical examination without abnormal findings: Secondary | ICD-10-CM | POA: Diagnosis not present

## 2019-09-09 DIAGNOSIS — E119 Type 2 diabetes mellitus without complications: Secondary | ICD-10-CM | POA: Diagnosis not present

## 2019-09-09 DIAGNOSIS — E7800 Pure hypercholesterolemia, unspecified: Secondary | ICD-10-CM | POA: Diagnosis not present

## 2019-09-09 DIAGNOSIS — Z23 Encounter for immunization: Secondary | ICD-10-CM | POA: Diagnosis not present

## 2019-09-09 DIAGNOSIS — Z125 Encounter for screening for malignant neoplasm of prostate: Secondary | ICD-10-CM | POA: Diagnosis not present

## 2019-09-09 DIAGNOSIS — Z1211 Encounter for screening for malignant neoplasm of colon: Secondary | ICD-10-CM | POA: Diagnosis not present

## 2019-09-09 DIAGNOSIS — E559 Vitamin D deficiency, unspecified: Secondary | ICD-10-CM | POA: Diagnosis not present

## 2019-09-09 DIAGNOSIS — N183 Chronic kidney disease, stage 3 unspecified: Secondary | ICD-10-CM | POA: Diagnosis not present

## 2019-09-09 DIAGNOSIS — E538 Deficiency of other specified B group vitamins: Secondary | ICD-10-CM | POA: Diagnosis not present

## 2019-09-09 DIAGNOSIS — I10 Essential (primary) hypertension: Secondary | ICD-10-CM | POA: Diagnosis not present

## 2019-09-09 DIAGNOSIS — M791 Myalgia, unspecified site: Secondary | ICD-10-CM | POA: Diagnosis not present

## 2019-09-09 DIAGNOSIS — R413 Other amnesia: Secondary | ICD-10-CM | POA: Diagnosis not present

## 2019-09-09 DIAGNOSIS — G4700 Insomnia, unspecified: Secondary | ICD-10-CM | POA: Diagnosis not present

## 2019-10-07 DIAGNOSIS — D631 Anemia in chronic kidney disease: Secondary | ICD-10-CM | POA: Diagnosis not present

## 2019-10-07 DIAGNOSIS — E1122 Type 2 diabetes mellitus with diabetic chronic kidney disease: Secondary | ICD-10-CM | POA: Diagnosis not present

## 2019-10-07 DIAGNOSIS — N1832 Chronic kidney disease, stage 3b: Secondary | ICD-10-CM | POA: Diagnosis not present

## 2019-10-07 DIAGNOSIS — I10 Essential (primary) hypertension: Secondary | ICD-10-CM | POA: Diagnosis not present

## 2019-10-11 DIAGNOSIS — I10 Essential (primary) hypertension: Secondary | ICD-10-CM | POA: Diagnosis not present

## 2019-11-16 DIAGNOSIS — L03032 Cellulitis of left toe: Secondary | ICD-10-CM | POA: Diagnosis not present

## 2019-11-16 DIAGNOSIS — B351 Tinea unguium: Secondary | ICD-10-CM | POA: Diagnosis not present

## 2019-11-16 DIAGNOSIS — M79675 Pain in left toe(s): Secondary | ICD-10-CM | POA: Diagnosis not present

## 2019-11-16 DIAGNOSIS — L600 Ingrowing nail: Secondary | ICD-10-CM | POA: Diagnosis not present

## 2019-11-16 DIAGNOSIS — E119 Type 2 diabetes mellitus without complications: Secondary | ICD-10-CM | POA: Diagnosis not present

## 2019-12-06 ENCOUNTER — Inpatient Hospital Stay: Payer: PPO | Attending: Oncology

## 2019-12-06 ENCOUNTER — Encounter: Payer: Self-pay | Admitting: Oncology

## 2019-12-06 DIAGNOSIS — D61818 Other pancytopenia: Secondary | ICD-10-CM | POA: Diagnosis not present

## 2019-12-06 DIAGNOSIS — D696 Thrombocytopenia, unspecified: Secondary | ICD-10-CM

## 2019-12-06 DIAGNOSIS — D539 Nutritional anemia, unspecified: Secondary | ICD-10-CM

## 2019-12-06 LAB — CBC WITH DIFFERENTIAL/PLATELET
Abs Immature Granulocytes: 0.02 10*3/uL (ref 0.00–0.07)
Basophils Absolute: 0 10*3/uL (ref 0.0–0.1)
Basophils Relative: 0 %
Eosinophils Absolute: 0.1 10*3/uL (ref 0.0–0.5)
Eosinophils Relative: 2 %
HCT: 35.1 % — ABNORMAL LOW (ref 39.0–52.0)
Hemoglobin: 12.7 g/dL — ABNORMAL LOW (ref 13.0–17.0)
Immature Granulocytes: 0 %
Lymphocytes Relative: 23 %
Lymphs Abs: 1.3 10*3/uL (ref 0.7–4.0)
MCH: 35.9 pg — ABNORMAL HIGH (ref 26.0–34.0)
MCHC: 36.2 g/dL — ABNORMAL HIGH (ref 30.0–36.0)
MCV: 99.2 fL (ref 80.0–100.0)
Monocytes Absolute: 0.5 10*3/uL (ref 0.1–1.0)
Monocytes Relative: 8 %
Neutro Abs: 3.8 10*3/uL (ref 1.7–7.7)
Neutrophils Relative %: 67 %
Platelets: 80 10*3/uL — ABNORMAL LOW (ref 150–400)
RBC: 3.54 MIL/uL — ABNORMAL LOW (ref 4.22–5.81)
RDW: 13.5 % (ref 11.5–15.5)
WBC: 5.7 10*3/uL (ref 4.0–10.5)
nRBC: 0 % (ref 0.0–0.2)

## 2019-12-06 LAB — COMPREHENSIVE METABOLIC PANEL
ALT: 16 U/L (ref 0–44)
AST: 27 U/L (ref 15–41)
Albumin: 3 g/dL — ABNORMAL LOW (ref 3.5–5.0)
Alkaline Phosphatase: 64 U/L (ref 38–126)
Anion gap: 10 (ref 5–15)
BUN: 22 mg/dL (ref 8–23)
CO2: 22 mmol/L (ref 22–32)
Calcium: 8.9 mg/dL (ref 8.9–10.3)
Chloride: 107 mmol/L (ref 98–111)
Creatinine, Ser: 1.44 mg/dL — ABNORMAL HIGH (ref 0.61–1.24)
GFR, Estimated: 53 mL/min — ABNORMAL LOW (ref >60)
Glucose, Bld: 120 mg/dL — ABNORMAL HIGH (ref 70–99)
Potassium: 4.1 mmol/L (ref 3.5–5.1)
Sodium: 139 mmol/L (ref 135–145)
Total Bilirubin: 2.6 mg/dL — ABNORMAL HIGH (ref 0.3–1.2)
Total Protein: 5.5 g/dL — ABNORMAL LOW (ref 6.5–8.1)

## 2019-12-10 ENCOUNTER — Other Ambulatory Visit: Payer: Self-pay | Admitting: *Deleted

## 2019-12-10 DIAGNOSIS — R5383 Other fatigue: Secondary | ICD-10-CM

## 2019-12-10 DIAGNOSIS — D696 Thrombocytopenia, unspecified: Secondary | ICD-10-CM

## 2019-12-10 DIAGNOSIS — D539 Nutritional anemia, unspecified: Secondary | ICD-10-CM

## 2019-12-23 ENCOUNTER — Other Ambulatory Visit: Payer: Self-pay

## 2019-12-23 ENCOUNTER — Inpatient Hospital Stay
Admission: EM | Admit: 2019-12-23 | Discharge: 2019-12-25 | DRG: 242 | Disposition: A | Discharge status: Home / Self Care | Payer: PPO | Source: Ambulatory Visit | Attending: Internal Medicine | Admitting: Internal Medicine

## 2019-12-23 ENCOUNTER — Emergency Department: Payer: PPO

## 2019-12-23 ENCOUNTER — Encounter: Payer: Self-pay | Admitting: *Deleted

## 2019-12-23 DIAGNOSIS — E119 Type 2 diabetes mellitus without complications: Secondary | ICD-10-CM | POA: Diagnosis present

## 2019-12-23 DIAGNOSIS — M1A9XX0 Chronic gout, unspecified, without tophus (tophi): Secondary | ICD-10-CM | POA: Diagnosis present

## 2019-12-23 DIAGNOSIS — R079 Chest pain, unspecified: Secondary | ICD-10-CM | POA: Diagnosis not present

## 2019-12-23 DIAGNOSIS — I10 Essential (primary) hypertension: Secondary | ICD-10-CM | POA: Diagnosis not present

## 2019-12-23 DIAGNOSIS — R001 Bradycardia, unspecified: Secondary | ICD-10-CM | POA: Diagnosis present

## 2019-12-23 DIAGNOSIS — Z20822 Contact with and (suspected) exposure to covid-19: Secondary | ICD-10-CM | POA: Diagnosis present

## 2019-12-23 DIAGNOSIS — I4510 Unspecified right bundle-branch block: Secondary | ICD-10-CM | POA: Diagnosis present

## 2019-12-23 DIAGNOSIS — I509 Heart failure, unspecified: Secondary | ICD-10-CM | POA: Diagnosis not present

## 2019-12-23 DIAGNOSIS — I441 Atrioventricular block, second degree: Secondary | ICD-10-CM | POA: Diagnosis present

## 2019-12-23 DIAGNOSIS — R0602 Shortness of breath: Secondary | ICD-10-CM | POA: Diagnosis not present

## 2019-12-23 DIAGNOSIS — Z8249 Family history of ischemic heart disease and other diseases of the circulatory system: Secondary | ICD-10-CM

## 2019-12-23 DIAGNOSIS — I5031 Acute diastolic (congestive) heart failure: Secondary | ICD-10-CM | POA: Diagnosis present

## 2019-12-23 DIAGNOSIS — E7800 Pure hypercholesterolemia, unspecified: Secondary | ICD-10-CM | POA: Diagnosis present

## 2019-12-23 DIAGNOSIS — Z7982 Long term (current) use of aspirin: Secondary | ICD-10-CM

## 2019-12-23 DIAGNOSIS — D696 Thrombocytopenia, unspecified: Secondary | ICD-10-CM | POA: Diagnosis present

## 2019-12-23 DIAGNOSIS — E785 Hyperlipidemia, unspecified: Secondary | ICD-10-CM | POA: Diagnosis present

## 2019-12-23 DIAGNOSIS — N400 Enlarged prostate without lower urinary tract symptoms: Secondary | ICD-10-CM | POA: Diagnosis present

## 2019-12-23 DIAGNOSIS — I110 Hypertensive heart disease with heart failure: Secondary | ICD-10-CM | POA: Diagnosis present

## 2019-12-23 DIAGNOSIS — Z85828 Personal history of other malignant neoplasm of skin: Secondary | ICD-10-CM

## 2019-12-23 DIAGNOSIS — J90 Pleural effusion, not elsewhere classified: Secondary | ICD-10-CM | POA: Diagnosis not present

## 2019-12-23 DIAGNOSIS — Z79899 Other long term (current) drug therapy: Secondary | ICD-10-CM

## 2019-12-23 DIAGNOSIS — I442 Atrioventricular block, complete: Secondary | ICD-10-CM | POA: Diagnosis not present

## 2019-12-23 HISTORY — DX: Thrombocytopenia, unspecified: D69.6

## 2019-12-23 LAB — COMPREHENSIVE METABOLIC PANEL
ALT: 14 U/L (ref 0–44)
AST: 28 U/L (ref 15–41)
Albumin: 2.8 g/dL — ABNORMAL LOW (ref 3.5–5.0)
Alkaline Phosphatase: 53 U/L (ref 38–126)
Anion gap: 9 (ref 5–15)
BUN: 19 mg/dL (ref 8–23)
CO2: 20 mmol/L — ABNORMAL LOW (ref 22–32)
Calcium: 8.9 mg/dL (ref 8.9–10.3)
Chloride: 110 mmol/L (ref 98–111)
Creatinine, Ser: 1.27 mg/dL — ABNORMAL HIGH (ref 0.61–1.24)
GFR, Estimated: >60 mL/min (ref >60)
Glucose, Bld: 113 mg/dL — ABNORMAL HIGH (ref 70–99)
Potassium: 4.1 mmol/L (ref 3.5–5.1)
Sodium: 139 mmol/L (ref 135–145)
Total Bilirubin: 3 mg/dL — ABNORMAL HIGH (ref 0.3–1.2)
Total Protein: 5.3 g/dL — ABNORMAL LOW (ref 6.5–8.1)

## 2019-12-23 LAB — CBC
HCT: 37.9 % — ABNORMAL LOW (ref 39.0–52.0)
Hemoglobin: 13.9 g/dL (ref 13.0–17.0)
MCH: 36.2 pg — ABNORMAL HIGH (ref 26.0–34.0)
MCHC: 36.7 g/dL — ABNORMAL HIGH (ref 30.0–36.0)
MCV: 98.7 fL (ref 80.0–100.0)
Platelets: 83 10*3/uL — ABNORMAL LOW (ref 150–400)
RBC: 3.84 MIL/uL — ABNORMAL LOW (ref 4.22–5.81)
RDW: 14.4 % (ref 11.5–15.5)
WBC: 5.9 10*3/uL (ref 4.0–10.5)
nRBC: 0 % (ref 0.0–0.2)

## 2019-12-23 LAB — FIBRIN DERIVATIVES D-DIMER (ARMC ONLY): Fibrin derivatives D-dimer (ARMC): 449.87 ng/mL (FEU) (ref 0.00–499.00)

## 2019-12-23 LAB — TSH: TSH: 3.053 u[IU]/mL (ref 0.350–4.500)

## 2019-12-23 LAB — RESP PANEL BY RT-PCR (FLU A&B, COVID) ARPGX2
Influenza A by PCR: NEGATIVE
Influenza B by PCR: NEGATIVE
SARS Coronavirus 2 by RT PCR: NEGATIVE

## 2019-12-23 LAB — CBG MONITORING, ED
Glucose-Capillary: 127 mg/dL — ABNORMAL HIGH (ref 70–99)
Glucose-Capillary: 134 mg/dL — ABNORMAL HIGH (ref 70–99)

## 2019-12-23 LAB — HEMOGLOBIN A1C
Hgb A1c MFr Bld: 5 % (ref 4.8–5.6)
Mean Plasma Glucose: 96.8 mg/dL

## 2019-12-23 LAB — SEDIMENTATION RATE: Sed Rate: 5 mm/hr (ref 0–20)

## 2019-12-23 LAB — TROPONIN I (HIGH SENSITIVITY)
Troponin I (High Sensitivity): 29 ng/L — ABNORMAL HIGH (ref <18)
Troponin I (High Sensitivity): 30 ng/L — ABNORMAL HIGH (ref <18)

## 2019-12-23 LAB — HIV ANTIBODY (ROUTINE TESTING W REFLEX): HIV Screen 4th Generation wRfx: NONREACTIVE

## 2019-12-23 LAB — BRAIN NATRIURETIC PEPTIDE: B Natriuretic Peptide: 220.7 pg/mL — ABNORMAL HIGH (ref 0.0–100.0)

## 2019-12-23 MED ORDER — DUTASTERIDE 0.5 MG PO CAPS
0.5000 mg | ORAL_CAPSULE | Freq: Every morning | ORAL | Status: DC
  Administered 2019-12-25: 09:00:00 0.5 mg via ORAL
  Filled 2019-12-23 (×2): qty 1

## 2019-12-23 MED ORDER — PRAVASTATIN SODIUM 20 MG PO TABS
10.0000 mg | ORAL_TABLET | Freq: Every day | ORAL | Status: DC
  Administered 2019-12-23 – 2019-12-24 (×2): 10 mg via ORAL
  Filled 2019-12-23 (×2): qty 1

## 2019-12-23 MED ORDER — OMEGA-3-ACID ETHYL ESTERS 1 G PO CAPS
1.0000 g | ORAL_CAPSULE | Freq: Every day | ORAL | Status: DC
  Administered 2019-12-25: 09:00:00 1 g via ORAL
  Filled 2019-12-23 (×2): qty 1

## 2019-12-23 MED ORDER — COLCHICINE 0.6 MG PO TABS: 0.6000 mg | ORAL_TABLET | Freq: Every day | ORAL | Status: DC

## 2019-12-23 MED ORDER — ONDANSETRON HCL 4 MG/2ML IJ SOLN: 4.0000 mg | Freq: Four times a day (QID) | INTRAMUSCULAR | Status: DC | PRN

## 2019-12-23 MED ORDER — DUTASTERIDE 0.5 MG PO CAPS: 0.5000 mg | ORAL_CAPSULE | Freq: Every morning | ORAL | Status: DC

## 2019-12-23 MED ORDER — INSULIN ASPART 100 UNIT/ML ~~LOC~~ SOLN
0.0000 [IU] | Freq: Three times a day (TID) | SUBCUTANEOUS | Status: DC
  Administered 2019-12-23: 17:00:00 2 [IU] via SUBCUTANEOUS
  Filled 2019-12-23: qty 1

## 2019-12-23 MED ORDER — SODIUM CHLORIDE 0.9% FLUSH
3.0000 mL | Freq: Two times a day (BID) | INTRAVENOUS | Status: DC
  Administered 2019-12-23 – 2019-12-25 (×3): 3 mL via INTRAVENOUS

## 2019-12-23 MED ORDER — SODIUM CHLORIDE 0.9% FLUSH
3.0000 mL | Freq: Two times a day (BID) | INTRAVENOUS | Status: DC
  Administered 2019-12-24: 22:00:00 3 mL via INTRAVENOUS

## 2019-12-23 MED ORDER — INSULIN ASPART 100 UNIT/ML ~~LOC~~ SOLN: 0.0000 [IU] | Freq: Every day | SUBCUTANEOUS | Status: DC

## 2019-12-23 MED ORDER — OMEGA-3-ACID ETHYL ESTERS 1 G PO CAPS: 1.0000 g | ORAL_CAPSULE | Freq: Every day | ORAL | Status: DC

## 2019-12-23 MED ORDER — LISINOPRIL-HYDROCHLOROTHIAZIDE 20-12.5 MG PO TABS: 1.0000 | ORAL_TABLET | Freq: Every morning | ORAL | Status: DC

## 2019-12-23 MED ORDER — FUROSEMIDE 10 MG/ML IJ SOLN
40.0000 mg | Freq: Every day | INTRAMUSCULAR | Status: DC
  Administered 2019-12-25: 09:00:00 40 mg via INTRAVENOUS
  Filled 2019-12-23: qty 4

## 2019-12-23 MED ORDER — ACETAMINOPHEN 325 MG PO TABS
650.0000 mg | ORAL_TABLET | Freq: Four times a day (QID) | ORAL | Status: DC | PRN
  Administered 2019-12-24: 650 mg via ORAL
  Filled 2019-12-23: qty 2

## 2019-12-23 MED ORDER — LISINOPRIL 20 MG PO TABS
20.0000 mg | ORAL_TABLET | Freq: Every day | ORAL | Status: DC
  Administered 2019-12-25: 09:00:00 20 mg via ORAL
  Filled 2019-12-23: qty 1

## 2019-12-23 MED ORDER — COLCHICINE 0.6 MG PO TABS
0.6000 mg | ORAL_TABLET | Freq: Every day | ORAL | Status: DC
  Administered 2019-12-25: 09:00:00 0.6 mg via ORAL
  Filled 2019-12-23 (×2): qty 1

## 2019-12-23 MED ORDER — ENOXAPARIN SODIUM 40 MG/0.4ML ~~LOC~~ SOLN: 40.0000 mg | SUBCUTANEOUS | Status: DC

## 2019-12-23 MED ORDER — ASPIRIN EC 81 MG PO TBEC
81.0000 mg | DELAYED_RELEASE_TABLET | Freq: Every day | ORAL | Status: DC
  Administered 2019-12-25: 09:00:00 81 mg via ORAL
  Filled 2019-12-23: qty 1

## 2019-12-23 MED ORDER — FEBUXOSTAT 40 MG PO TABS: 40.0000 mg | ORAL_TABLET | Freq: Every day | ORAL | Status: DC

## 2019-12-23 MED ORDER — FEBUXOSTAT 40 MG PO TABS
40.0000 mg | ORAL_TABLET | Freq: Every day | ORAL | Status: DC
  Administered 2019-12-25: 09:00:00 40 mg via ORAL
  Filled 2019-12-23 (×2): qty 1

## 2019-12-23 MED ORDER — ONDANSETRON HCL 4 MG PO TABS: 4.0000 mg | ORAL_TABLET | Freq: Four times a day (QID) | ORAL | Status: DC | PRN

## 2019-12-23 MED ORDER — VITAMIN D 25 MCG (1000 UNIT) PO TABS
2000.0000 [IU] | ORAL_TABLET | Freq: Every day | ORAL | Status: DC
  Administered 2019-12-25: 09:00:00 2000 [IU] via ORAL
  Filled 2019-12-23: qty 2

## 2019-12-23 MED ORDER — ACETAMINOPHEN 650 MG RE SUPP: 650.0000 mg | Freq: Four times a day (QID) | RECTAL | Status: DC | PRN

## 2019-12-23 MED ORDER — SODIUM CHLORIDE 0.9 % IV SOLN: 250.0000 mL | INTRAVENOUS | Status: DC | PRN

## 2019-12-23 MED ORDER — SODIUM CHLORIDE 0.9% FLUSH: 3.0000 mL | INTRAVENOUS | Status: DC | PRN

## 2019-12-23 NOTE — ED Triage Notes (Signed)
Pt has had CP for about a week and a half.  Pt has had some sob when he bends over.  Pt went to [facility] and had VS checked and was sent here for further evaluation

## 2019-12-23 NOTE — ED Triage Notes (Signed)
Pt is currently pain free,  The pain occurs when he beds over and is in upper chest under neck

## 2019-12-23 NOTE — Consult Note (Addendum)
Cardiology Consultation Note    Patient ID: Dean Lynch, MRN: [id number], DOB/AGE: January 07, 1951 69 y.o. Admit date: 12/23/2019   Date of Consult: 12/23/2019 Primary Physician: Toni Arthurs, NP Primary Cardiologist: Gaylon Bentz  Chief Complaint: dizzyness/nausea Reason for Consultation: bradycardia Requesting MD: agbata  HPI: Dean Lynch is a 69 y.o. male with history of diabetes, hyperlipidemia, hypertension who presented to emergency room with complaints of dizziness fatigue and occasional chest pain.  He is a very difficult historian and his wife who is with him is also somewhat unclear regarding his medications however per chart he is on carvedilol 3.125 to 6.25 mg twice daily.  Also is on enteric-coated aspirin.  He is also on lisinopril hydrochlorothiazide for his blood pressure.  In the emergency room he was noted to be bradycardic.  Electrocardiogram showed second-degree heart block with 2-1 conduction a ventricular rate of 37.  There have been no pauses.  He is hemodynamically stable.  His wife and he states he has been feeling somewhat sluggish for several days to weeks.  Review of the chart did does not know bradycardia and any of his outside visits recently however.  Troponin was unremarkable.  Electrolytes show a creatinine of 1.27 improved from 1.442 weeks ago.  Potassium is normal.  Sed rate is 5.  He denies syncope but has had dizziness.  Past Medical History:  Diagnosis Date  . BPH (benign prostatic hyperplasia)   . Diabetes mellitus without complication ([place])   . Gout   . Hypercholesteremia   . Hypertension   . Skin cancer 2017   melanoa- removed at [facility]      Surgical History:  Past Surgical History:  Procedure Laterality Date  . CATARACT EXTRACTION W/PHACO Right 09/27/2015   Procedure: CATARACT EXTRACTION PHACO AND INTRAOCULAR LENS PLACEMENT (IOC);  Surgeon: Leandrew Koyanagi, MD;  Location: [facility];  Service: Ophthalmology;  Laterality: Right;   DIABETIC  . KNEE ARTHROSCOPY       Home Meds: Prior to Admission medications   Medication Sig Start Date End Date Taking? Authorizing Provider  aspirin EC 81 MG tablet Take 81 mg by mouth daily.  02/25/18   [provider]  carvedilol (COREG) 6.25 MG tablet Take 6.25 mg by mouth 2 (two) times daily. 11/27/18   [provider]  Cholecalciferol (VITAMIN D3) 50 MCG (2000 UT) capsule Take 2,000 Units by mouth daily.  02/26/18   [provider]  clobetasol ointment (TEMOVATE) 4.43 % Apply 1 application topically 2 (two) times daily as needed.  03/28/17   [provider]  colchicine 0.6 MG tablet Take 0.6 mg by mouth daily.  02/25/18   [provider]  dutasteride (AVODART) 0.5 MG capsule Take 0.5 mg by mouth every morning.    [provider]  febuxostat (ULORIC) 40 MG tablet Take 40 mg by mouth daily.  02/25/18   [provider]  glimepiride (AMARYL) 1 MG tablet Take 2 mg by mouth daily with breakfast.  07/18/16   [provider]  hydrocortisone 2.5 % ointment Apply 1 application topically daily.  12/06/15   [provider]  ketoconazole (NIZORAL) 2 % cream Apply 1 application topically daily.  12/06/15   [provider]  lisinopril-hydrochlorothiazide (PRINZIDE,ZESTORETIC) 20-12.5 MG tablet Take 1 tablet by mouth every morning.    [provider]  lovastatin (MEVACOR) 10 MG tablet Take 10 mg by mouth 3 (three) times a week.    [provider]  Omega-3 Fatty Acids (FISH OIL)  1000 MG CAPS Take 1 capsule by mouth daily.    [provider]    Inpatient Medications:  . aspirin EC  81 mg Oral Daily  . colchicine  0.6 mg Oral Daily  . dutasteride  0.5 mg Oral q morning - 10a  . enoxaparin (LOVENOX) injection  40 mg Subcutaneous Q24H  . febuxostat  40 mg Oral Daily  . insulin aspart  0-15 Units Subcutaneous TID WC  . insulin aspart  0-5 Units Subcutaneous QHS  .  lisinopril-hydrochlorothiazide  1 tablet Oral q morning - 10a  . omega-3 acid ethyl esters  1 g Oral Daily  . pravastatin  10 mg Oral q1800  . sodium chloride flush  3 mL Intravenous Q12H  . sodium chloride flush  3 mL Intravenous Q12H  . Vitamin D3  2,000 Units Oral Daily   . sodium chloride      Allergies:  Allergies  Allergen Reactions  . Fenofibrate Micronized Other (See Comments)    Elevated Liver Enzymes    Social History   Socioeconomic History  . Marital status: Married    Spouse name: Not on file  . Number of children: Not on file  . Years of education: Not on file  . Highest education level: Not on file  Occupational History  . Not on file  Tobacco Use  . Smoking status: Never Smoker  . Smokeless tobacco: Never Used  Vaping Use  . Vaping Use: Never used  Substance and Sexual Activity  . Alcohol use: No  . Drug use: No  . Sexual activity: Yes  Other Topics Concern  . Not on file  Social History Narrative  . Not on file   Social Determinants of Health   Financial Resource Strain: Not on file  Food Insecurity: Not on file  Transportation Needs: Not on file  Physical Activity: Not on file  Stress: Not on file  Social Connections: Not on file  Intimate Partner Violence: Not on file     Family History  Problem Relation Age of Onset  . Coronary artery disease Mother   . Polycythemia Mother   . Macular degeneration Mother   . Dementia Mother   . Coronary artery disease Father      Review of Systems: A 12-system review of systems was performed and is negative except as noted in the HPI.  Labs: No results for input(s): CKTOTAL, CKMB, TROPONINI in the last 72 hours. Lab Results  Component Value Date   WBC 5.9 12/23/2019   HGB 13.9 12/23/2019   HCT 37.9 (L) 12/23/2019   MCV 98.7 12/23/2019   PLT 83 (L) 12/23/2019    Recent Labs  Lab 12/23/19 1242  NA 139  K 4.1  CL 110  CO2 20*  BUN 19  CREATININE 1.27*  CALCIUM 8.9  PROT 5.3*   BILITOT 3.0*  ALKPHOS 53  ALT 14  AST 28  GLUCOSE 113*   No results found for: CHOL, HDL, LDLCALC, TRIG No results found for: DDIMER  Radiology/Studies:  DG Chest 2 View  Result Date: 12/23/2019 CLINICAL DATA:  Chest pain shortness of breath EXAM: CHEST - 2 VIEW COMPARISON:  None. FINDINGS: Moderate left and small right pleural effusions. Overlying left basilar opacities. No visible pneumothorax. Cardiomediastinal silhouette is within normal limits. Aortic atherosclerosis. Polyarticular degenerative change. No acute osseous abnormality. IMPRESSION: Moderate left and small right pleural effusions. Overlying left basilar opacities may represent atelectasis or pneumonia. Electronically Signed   By: Margaretha Sheffield MD  On: 12/23/2019 10:51    Wt Readings from Last 3 Encounters:  03/05/19 101.4 kg  01/20/19 99.8 kg  03/02/18 101.9 kg    EKG: Sinus rhythm with second-degree heart block 2-1 conduction   Physical Exam:  Blood pressure (!) 180/59, pulse (!) 36, temperature 98.6 F (37 C), temperature source Oral, resp. rate 20, SpO2 98 %. There is no height or weight on file to calculate BMI. General: Well developed, well nourished, in no acute distress. Head: Normocephalic, atraumatic, sclera non-icteric, no xanthomas, nares are without discharge.  Neck: Negative for carotid bruits. JVD not elevated. Lungs: Clear bilaterally to auscultation without wheezes, rales, or rhonchi. Breathing is unlabored. Heart: bradycardia Abdomen: Soft, non-tender, non-distended with normoactive bowel sounds. No hepatomegaly. No rebound/guarding. No obvious abdominal masses. Msk:  Strength and tone appear normal for age. Extremities: No clubbing or cyanosis. No edema.  Distal pedal pulses are 2+ and equal bilaterally. Neuro: Alert and oriented X 3. No facial asymmetry. No focal deficit. Moves all extremities spontaneously. Psych:  Responds to questions appropriately with a normal affect.      Assessment and Plan  69 year old male with history of hypertension who presented to the emergency room with complaints of dizziness nausea and occasional chest pain.  EKG showed second-degree heart block with 2-1 conduction ventricular rate of approximately 35-38.  Hemodynamically stable.  Etiology of this is unclear.  Patient most likely is on at least 3.125 if not 6.25 of carvedilol twice daily.  He is unaware if he is taking this or not but his outpatient medical lists included.  No significant electrolyte abnormalities noted.  No ischemic changes on electrocardiogram and negative high-sensitivity troponin.  1.  Bradycardia-secondary to second-degree heart block.  This may be related to beta-blocker therapy which will need to washout.  If conduction does not improve overnight consideration for permanent pacemaker will need to be placed. Has been scheduled for 7:00am tomorrow morning. Will hold lovenox.   2.  Hypertension-we will need to treat with non-AV nodal related medications for now.  3.  Diabetes-continue with oral agents.  Signed, Teodoro Spray MD 12/23/2019, 2:17 PM Pager: [phone]

## 2019-12-23 NOTE — H&P (Signed)
History and Physical    Dean Lynch:[id number] DOB: 1950/04/16 DOA: 12/23/2019  PCP: Toni Arthurs, NP   Patient coming from: Home  I have personally briefly reviewed patient's old medical records in [place]  Chief Complaint: Shortness of breath  HPI: Dean Lynch is a 69 y.o. male with medical history significant for type 2 diabetes mellitus, hypertension, dyslipidemia who was sent to the emergency room from the urgent care clinic where he went for evaluation of chest discomfort and shortness of breath.  Patient states that he has had symptoms for about a week and half and his wife notes that his symptoms have progressively worsened over the last 1 week. He gets out of breath just walking to the mailbox and back and notes that he is increasingly fatigued with minimal exertion.  He has bilateral lower extremity swelling and describes a midsternal chest discomfort not related to rest or exertion and not associated with nausea, no vomiting, no diaphoresis or palpitations. He had gone to the urgent care center for these symptoms  and they noted he was bradycardic with heart rate in the 40's.  He complains of intermittent dizziness lightheadedness but denies any loss of consciousness or fall. He has a cough which is nonproductive denies having any fever or chills.  He has no abdominal pain, no changes in his bowel habits, no urinary symptoms, no headaches, no blurry vision or mental status changes. Labs show sodium 139, potassium 4.1, chloride 110, bicarb 20, glucose 113, BUN 19, creatinine 1.37, calcium 8.9, alkaline phosphatase 53, albumin 3.8, AST 28, ALT 14, total protein 5.3, troponin 29 >> 30, white count 5.9, hemoglobin 13.9, hematocrit 37.9, MCV 98.7, RDW 14.4, platelet count 83, Respiratory viral panel is negative Chest x-ray reviewed by me showed bilateral pleural effusions (Lt > Rt) Twelve-lead EKG reviewed by me shows sinus rhythm with a second-degree AV block with  2 1 conduction Right bundle branch block     ED Course: Patient is a 69 year old Caucasian male who was sent to the emergency room from the urgent care center for evaluation of bradycardia.  Patient noted to have acute CHF and will be admitted to the hospital for further evaluation.  Review of Systems: As per HPI otherwise all negative.    Past Medical History:  Diagnosis Date  . BPH (benign prostatic hyperplasia)   . Diabetes mellitus without complication ([place])   . Gout   . Hypercholesteremia   . Hypertension   . Skin cancer 2017   melanoa- removed at [facility]    Past Surgical History:  Procedure Laterality Date  . CATARACT EXTRACTION W/PHACO Right 09/27/2015   Procedure: CATARACT EXTRACTION PHACO AND INTRAOCULAR LENS PLACEMENT (IOC);  Surgeon: Leandrew Koyanagi, MD;  Location: [facility];  Service: Ophthalmology;  Laterality: Right;  DIABETIC  . KNEE ARTHROSCOPY       reports that he has never smoked. He has never used smokeless tobacco. He reports that he does not drink alcohol and does not use drugs.  Allergies  Allergen Reactions  . Fenofibrate Micronized Other (See Comments)    Elevated Liver Enzymes    Family History  Problem Relation Age of Onset  . Coronary artery disease Mother   . Polycythemia Mother   . Macular degeneration Mother   . Dementia Mother   . Coronary artery disease Father      Prior to Admission medications   Medication Sig Start Date End Date Taking? Authorizing Provider  aspirin EC 81 MG  tablet Take 81 mg by mouth daily.  02/25/18   [provider]  carvedilol (COREG) 6.25 MG tablet Take 6.25 mg by mouth 2 (two) times daily. 11/27/18   [provider]  Cholecalciferol (VITAMIN D3) 50 MCG (2000 UT) capsule Take 2,000 Units by mouth daily.  02/26/18   [provider]  clobetasol ointment (TEMOVATE) 1.74 % Apply 1 application topically 2 (two) times daily as needed.  03/28/17   [provider]   colchicine 0.6 MG tablet Take 0.6 mg by mouth daily.  02/25/18   [provider]  dutasteride (AVODART) 0.5 MG capsule Take 0.5 mg by mouth every morning.    [provider]  febuxostat (ULORIC) 40 MG tablet Take 40 mg by mouth daily.  02/25/18   [provider]  glimepiride (AMARYL) 1 MG tablet Take 2 mg by mouth daily with breakfast.  07/18/16   [provider]  hydrocortisone 2.5 % ointment Apply 1 application topically daily.  12/06/15   [provider]  ketoconazole (NIZORAL) 2 % cream Apply 1 application topically daily.  12/06/15   [provider]  lisinopril-hydrochlorothiazide (PRINZIDE,ZESTORETIC) 20-12.5 MG tablet Take 1 tablet by mouth every morning.    [provider]  lovastatin (MEVACOR) 10 MG tablet Take 10 mg by mouth 3 (three) times a week.    [provider]  Omega-3 Fatty Acids (FISH OIL) 1000 MG CAPS Take 1 capsule by mouth daily.    [provider]    Physical Exam: Vitals:   12/23/19 1045 12/23/19 1145 12/23/19 1200 12/23/19 1300  BP: (!) 183/68 (!) 187/68 (!) 187/63 (!) 180/59  Pulse: (!) 42 (!) 36 (!) 36 (!) 36  Resp: (!) 22 18 20 20   Temp:      TempSrc:      SpO2: 98% 97% 98% 98%     Vitals:   12/23/19 1045 12/23/19 1145 12/23/19 1200 12/23/19 1300  BP: (!) 183/68 (!) 187/68 (!) 187/63 (!) 180/59  Pulse: (!) 42 (!) 36 (!) 36 (!) 36  Resp: (!) 22 18 20 20   Temp:      TempSrc:      SpO2: 98% 97% 98% 98%    Constitutional: NAD, alert and oriented x 3.  Appears comfortable and in no acute Eyes: PERRL, lids and conjunctivae normal ENMT: Mucous membranes are moist.  Neck: normal, supple, no masses, no thyromegaly Respiratory:  bilateral air entry, no wheezing, crackles at the bases. Normal respiratory effort. No accessory muscle use.  Cardiovascular: Regular rate and rhythm, bradycardia, no murmurs / rubs / gallops. 2+ extremity edema. 2+ pedal pulses. No carotid bruits.   Abdomen: no tenderness, no masses palpated. No hepatosplenomegaly. Bowel sounds positive.  Musculoskeletal: no clubbing / cyanosis. No joint deformity upper and lower extremities.  Skin: no rashes, lesions, ulcers.  Neurologic: No gross focal neurologic deficit. Psychiatric: Normal mood and affect.   Labs on Admission: I have personally reviewed following labs and imaging studies  CBC: Recent Labs  Lab 12/23/19 1102  WBC 5.9  HGB 13.9  HCT 37.9*  MCV 98.7  PLT 83*   Basic Metabolic Panel: Recent Labs  Lab 12/23/19 1242  NA 139  K 4.1  CL 110  CO2 20*  GLUCOSE 113*  BUN 19  CREATININE 1.27*  CALCIUM 8.9   GFR: CrCl cannot be calculated (Unknown ideal weight.). Liver Function Tests: Recent Labs  Lab 12/23/19 1242  AST 28  ALT 14  ALKPHOS 53  BILITOT 3.0*  PROT 5.3*  ALBUMIN 2.8*   No results for input(s): LIPASE, AMYLASE in the last 168 hours. No results for input(s): AMMONIA in the last 168 hours. Coagulation Profile: No results for input(s): INR, PROTIME in the last 168 hours. Cardiac Enzymes: No results for input(s): CKTOTAL, CKMB, CKMBINDEX, TROPONINI in the last 168 hours. BNP (last 3 results) No results for input(s): PROBNP in the last 8760 hours. HbA1C: No results for input(s): HGBA1C in the last 72 hours. CBG: No results for input(s): GLUCAP in the last 168 hours. Lipid Profile: No results for input(s): CHOL, HDL, LDLCALC, TRIG, CHOLHDL, LDLDIRECT in the last 72 hours. Thyroid Function Tests: No results for input(s): TSH, T4TOTAL, FREET4, T3FREE, THYROIDAB in the last 72 hours. Anemia Panel: No results for input(s): VITAMINB12, FOLATE, FERRITIN, TIBC, IRON, RETICCTPCT in the last 72 hours. Urine analysis:    Component Value Date/Time   APPEARANCEUR Clear 01/20/2019 1106   GLUCOSEU 3+ (A) 01/20/2019 1106   BILIRUBINUR Negative 01/20/2019 1106   PROTEINUR Negative 01/20/2019 1106   NITRITE Negative 01/20/2019 1106   LEUKOCYTESUR Negative  01/20/2019 1106    Radiological Exams on Admission: DG Chest 2 View  Result Date: 12/23/2019 CLINICAL DATA:  Chest pain shortness of breath EXAM: CHEST - 2 VIEW COMPARISON:  None. FINDINGS: Moderate left and small right pleural effusions. Overlying left basilar opacities. No visible pneumothorax. Cardiomediastinal silhouette is within normal limits. Aortic atherosclerosis. Polyarticular degenerative change. No acute osseous abnormality. IMPRESSION: Moderate left and small right pleural effusions. Overlying left basilar opacities may represent atelectasis or pneumonia. Electronically Signed   By: Margaretha Sheffield MD   On: 12/23/2019 10:51    EKG: Independently reviewed.  Sinus rhythm Type II second-degree AV block with 2-1 conduction Right bundle branch block  Assessment/Plan Principal Problem:   Bradycardia Active Problems:   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 tophus   Diabetes mellitus type 2, uncomplicated (HCC)   Essential hypertension   Thrombocytopenia (HCC)   Acute CHF (congestive heart failure) (HCC)     Bradycardia Patient noted to be bradycardic during office appointment with heart rate in the low 40s and at rest in the upper 30s He complains of intermittent dizziness and lightheadedness but denies any falls or loss of consciousness We will hold carvedilol Check TSH Request cardiology consult    Acute CHF Unclear etiology May be diastolic and related to hypertensive heart disease We will start patient on Lasix 40 mg IV daily Hold hydrochlorothiazide for now Optimize blood pressure control Obtain 2D echocardiogram to assess LVEF    Hypertension Uncontrolled Continue lisinopril 20 mg daily May uptitrate dose to optimize blood pressure control    Diabetes mellitus Hold oral hypoglycemic agents Maintain consistent carbohydrate diet Sliding scale insulin for glycemic control    Thrombocytopenia Chronic No evidence of bleeding    Gout Continue colchicine and  Febuxostat    Dyslipidemia Continue pravastatin and fish oil    DVT prophylaxis: SCD Code Status: Full code Family Communication: Greater than 50% of time was spent discussing plan of care with patient and his wife at the bedside. All questions and concerns have been addressed. They verbalized understanding and agree with the plan. Disposition Plan: Back to previous home environment Consults called: Cardiology    Marieme Mcmackin MD [facility]     12/23/2019, 2:42 PM

## 2019-12-23 NOTE — ED Provider Notes (Signed)
[facility] Emergency Department Provider Note   ____________________________________________   Event Date/Time   First MD Initiated Contact with Patient 12/23/19 1034     (approximate)  I have reviewed the triage vital signs and the nursing notes.   HISTORY  Chief Complaint Chest Pain    HPI Dean Lynch is a 69 y.o. male with a stated past medical history of type 2 diabetes and hypertension who presents for chest pain is intermittent upper chest tightness that partially radiates up into his neck and is worse with bending over.  Patient states that is been there for approximately 1.5 weeks and is associated with shortness of breath whenever he bends over.  Patient states that he went to the [facility] earlier today to have his vital signs checked and was told to present to the emergency department due to an extremely low heart rate.  Patient currently denies any vision changes, tinnitus, difficulty speaking, facial droop, sore throat, chest pain, shortness of breath, abdominal pain, nausea/vomiting/diarrhea, dysuria, or weakness/numbness/paresthesias in any extremity         Past Medical History:  Diagnosis Date  . BPH (benign prostatic hyperplasia)   . Diabetes mellitus without complication ([place])   . Gout   . Hypercholesteremia   . Hypertension   . Skin cancer 2017   melanoa- removed at [facility]    Patient Active Problem List   Diagnosis Date Noted  . Thrombocytopenia ([place]) 07/18/2016  . Benign prostatic hyperplasia without lower urinary tract symptoms 07/16/2016  . History of melanoma 12/06/2015  . Diabetes mellitus type 2, uncomplicated ([place]) 59/29/2446  . 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 tophus 12/15/2013  . Essential hypertension 12/15/2013  . Hypercholesterolemia 12/15/2013    Past Surgical History:  Procedure Laterality Date  . CATARACT EXTRACTION W/PHACO Right 09/27/2015   Procedure: CATARACT EXTRACTION PHACO AND INTRAOCULAR LENS PLACEMENT  (IOC);  Surgeon: Leandrew Koyanagi, MD;  Location: [place];  Service: Ophthalmology;  Laterality: Right;  DIABETIC  . KNEE ARTHROSCOPY      Prior to Admission medications   Medication Sig Start Date End Date Taking? Authorizing Provider  aspirin EC 81 MG tablet Take 81 mg by mouth daily.  02/25/18   [provider]  carvedilol (COREG) 6.25 MG tablet Take 6.25 mg by mouth 2 (two) times daily. 11/27/18   [provider]  Cholecalciferol (VITAMIN D3) 50 MCG (2000 UT) capsule Take 2,000 Units by mouth daily.  02/26/18   [provider]  clobetasol ointment (TEMOVATE) 2.86 % Apply 1 application topically 2 (two) times daily as needed.  03/28/17   [provider]  colchicine 0.6 MG tablet Take 0.6 mg by mouth daily.  02/25/18   [provider]  dutasteride (AVODART) 0.5 MG capsule Take 0.5 mg by mouth every morning.    [provider]  febuxostat (ULORIC) 40 MG tablet Take 40 mg by mouth daily.  02/25/18   [provider]  glimepiride (AMARYL) 1 MG tablet Take 2 mg by mouth daily with breakfast.  07/18/16   [provider]  hydrocortisone 2.5 % ointment Apply 1 application topically daily.  12/06/15   [provider]  ketoconazole (NIZORAL) 2 % cream Apply 1 application topically daily.  12/06/15   [provider]  lisinopril-hydrochlorothiazide (PRINZIDE,ZESTORETIC) 20-12.5 MG tablet Take 1 tablet by mouth every morning.    [provider]  lovastatin (MEVACOR) 10 MG tablet Take 10 mg by mouth 3 (three) times a week.    [provider]  Omega-3 Fatty Acids (FISH OIL) 1000 MG CAPS Take 1 capsule by mouth daily.    [provider]    Allergies Fenofibrate micronized  Family History  Problem Relation Age of Onset  . Coronary artery disease Mother   . Polycythemia Mother   . Macular degeneration Mother   . Dementia Mother   . Coronary artery disease Father     Social  History Social History   Tobacco Use  . Smoking status: Never Smoker  . Smokeless tobacco: Never Used  Vaping Use  . Vaping Use: Never used  Substance Use Topics  . Alcohol use: No  . Drug use: No    Review of Systems Constitutional: No fever/chills Eyes: No visual changes. ENT: No sore throat. Cardiovascular: Endorses chest pain. Respiratory: Endorses shortness of breath. Gastrointestinal: No abdominal pain.  No nausea, no vomiting.  No diarrhea. Genitourinary: Negative for dysuria. Musculoskeletal: Negative for acute arthralgias Skin: Negative for rash. Neurological: Negative for headaches, weakness/numbness/paresthesias in any extremity Psychiatric: Negative for suicidal ideation/homicidal ideation   ____________________________________________   PHYSICAL EXAM:  VITAL SIGNS: ED Triage Vitals [12/23/19 1014]  Enc Vitals Group     BP (!) 148/62     Pulse Rate (!) 42     Resp 16     Temp 98.6 F (37 C)     Temp Source Oral     SpO2 96 %     Weight      Height      Head Circumference      Peak Flow      Pain Score 0     Pain Loc      Pain Edu?      Excl. in [place]?    Constitutional: Alert and oriented. Well appearing and in no acute distress. Eyes: Conjunctivae are normal. PERRL. Head: Atraumatic. Nose: No congestion/rhinnorhea. Mouth/Throat: Mucous membranes are moist. Neck: No stridor Cardiovascular: Bradycardic.  Grossly normal heart sounds.  Good peripheral circulation. Respiratory: Normal respiratory effort.  No retractions. Gastrointestinal: Soft and nontender. No distention. Musculoskeletal: No obvious deformities Neurologic:  Normal speech and language. No gross focal neurologic deficits are appreciated. Skin:  Skin is warm and dry. No rash noted. Psychiatric: Mood and affect are normal. Speech and behavior are normal.  ____________________________________________   LABS (all labs ordered are listed, but only abnormal results are  displayed)  Labs Reviewed  CBC - Abnormal; Notable for the following components:      Result Value   RBC 3.84 (*)    HCT 37.9 (*)    MCH 36.2 (*)    MCHC 36.7 (*)    Platelets 83 (*)    All other components within normal limits  COMPREHENSIVE METABOLIC PANEL - Abnormal; Notable for the following components:   CO2 20 (*)    Glucose, Bld 113 (*)    Creatinine, Ser 1.27 (*)    Total Protein 5.3 (*)    Albumin 2.8 (*)    Total Bilirubin 3.0 (*)    All other components within normal limits  TROPONIN I (HIGH SENSITIVITY) - Abnormal; Notable for the following components:   Troponin I (High Sensitivity) 29 (*)    All other components within normal limits  TROPONIN I (HIGH SENSITIVITY) - Abnormal; Notable for the following components:   Troponin I (High Sensitivity) 30 (*)    All other components within normal limits  RESP PANEL BY RT-PCR (FLU A&B, COVID) ARPGX2  FIBRIN DERIVATIVES D-DIMER ([facility] ONLY)  SEDIMENTATION RATE  ____________________________________________  EKG  ED ECG REPORT I, Naaman Plummer, the attending physician, personally viewed and interpreted this ECG.  Date: 12/23/2019 EKG Time: 1015 Rate: 42 Rhythm: Bradycardic sinus rhythm QRS Axis: normal Intervals: normal ST/T Wave abnormalities: normal Narrative Interpretation: no evidence of acute ischemia  ____________________________________________  RADIOLOGY  ED MD interpretation: 2 view x-ray of the chest shows moderate left and right pleural effusions with overlying basilar opacities concerning for atelectasis versus pneumonia.  No evidence of pneumothorax or widened mediastinum  Official radiology report(s): DG Chest 2 View  Result Date: 12/23/2019 CLINICAL DATA:  Chest pain shortness of breath EXAM: CHEST - 2 VIEW COMPARISON:  None. FINDINGS: Moderate left and small right pleural effusions. Overlying left basilar opacities. No visible pneumothorax. Cardiomediastinal silhouette is within normal limits.  Aortic atherosclerosis. Polyarticular degenerative change. No acute osseous abnormality. IMPRESSION: Moderate left and small right pleural effusions. Overlying left basilar opacities may represent atelectasis or pneumonia. Electronically Signed   By: Margaretha Sheffield MD   On: 12/23/2019 10:51    ____________________________________________   PROCEDURES  Procedure(s) performed (including Critical Care):  .Critical Care Performed by: Naaman Plummer, MD Authorized by: Naaman Plummer, MD   Critical care provider statement:    Critical care time (minutes):  35   Critical care time was exclusive of:  Separately billable procedures and treating other patients   Critical care was necessary to treat or prevent imminent or life-threatening deterioration of the following conditions:  Cardiac failure   Critical care was time spent personally by me on the following activities:  Discussions with consultants, evaluation of patient's response to treatment, examination of patient, ordering and performing treatments and interventions, ordering and review of laboratory studies, ordering and review of radiographic studies, pulse oximetry, re-evaluation of patient's condition, obtaining history from patient or surrogate and review of old charts   I assumed direction of critical care for this patient from another provider in my specialty: no   .1-3 Lead EKG Interpretation Performed by: Naaman Plummer, MD Authorized by: Naaman Plummer, MD     Interpretation: abnormal     ECG rate:  36   ECG rate assessment: bradycardic     Rhythm: sinus bradycardia     Ectopy: none     Conduction: abnormal     Abnormal conduction: 2nd degree AV block ([place] 1)       ____________________________________________   INITIAL IMPRESSION / ASSESSMENT AND PLAN / ED COURSE  As part of my medical decision making, I reviewed the following data within the [place] notes reviewed and  incorporated, Labs reviewed, EKG interpreted, Old chart reviewed, Radiograph reviewed and Notes from prior ED visits reviewed and incorporated        The patient is suffering from bradycardia, but the immediate cause is not apparent.   Patient WITHOUT concerning signs of instability on exam such as altered mental status, hypotension, evidence of cardiac end organ dysfunction, or acute heart failure.  Potential emergent causes considered include, but are not limited to:  Myocardial Infarction (RCA lesion) Infection Hypothyroidism Hyperkalemia Hypoglycemia Dehydration Intoxication (beta blockade, calcium channel blockade, clonidine, digoxin, opiates, alcohol or other).  Findings: Despite the evaluation including history, exam, and testing, the cause of the bradycardia remains unclear. However the history, exam, and tests do not raise concern for the above emergent diagnoses. Consult: I spoke to Dr. Ubaldo Glassing in cardiology who agrees with plan for admission and further observation Disposition: Admit  ____________________________________________   FINAL CLINICAL IMPRESSION(S) / ED DIAGNOSES  Final diagnoses:  Symptomatic bradycardia  Chest pain, unspecified type     ED Discharge Orders    None       Note:  This document was prepared using Dragon voice recognition software and may include unintentional dictation errors.   Naaman Plummer, MD 12/23/19 1335

## 2019-12-24 ENCOUNTER — Encounter: Payer: Self-pay | Admitting: Internal Medicine

## 2019-12-24 ENCOUNTER — Encounter
Admission: EM | Disposition: A | Discharge status: Home / Self Care | Payer: Self-pay | Source: Ambulatory Visit | Attending: Internal Medicine

## 2019-12-24 ENCOUNTER — Other Ambulatory Visit: Payer: Self-pay

## 2019-12-24 ENCOUNTER — Observation Stay
Admit: 2019-12-24 | Discharge: 2019-12-24 | Disposition: A | Discharge status: Home / Self Care | Payer: PPO | Attending: Internal Medicine | Admitting: Internal Medicine

## 2019-12-24 ENCOUNTER — Observation Stay: Admit: 2019-12-24 | Payer: PPO

## 2019-12-24 DIAGNOSIS — Z8249 Family history of ischemic heart disease and other diseases of the circulatory system: Secondary | ICD-10-CM | POA: Diagnosis not present

## 2019-12-24 DIAGNOSIS — I441 Atrioventricular block, second degree: Secondary | ICD-10-CM | POA: Diagnosis present

## 2019-12-24 DIAGNOSIS — I10 Essential (primary) hypertension: Secondary | ICD-10-CM | POA: Diagnosis not present

## 2019-12-24 DIAGNOSIS — D696 Thrombocytopenia, unspecified: Secondary | ICD-10-CM | POA: Diagnosis present

## 2019-12-24 DIAGNOSIS — Z79899 Other long term (current) drug therapy: Secondary | ICD-10-CM | POA: Diagnosis not present

## 2019-12-24 DIAGNOSIS — E785 Hyperlipidemia, unspecified: Secondary | ICD-10-CM | POA: Diagnosis present

## 2019-12-24 DIAGNOSIS — I110 Hypertensive heart disease with heart failure: Secondary | ICD-10-CM | POA: Diagnosis present

## 2019-12-24 DIAGNOSIS — E119 Type 2 diabetes mellitus without complications: Secondary | ICD-10-CM

## 2019-12-24 DIAGNOSIS — I5031 Acute diastolic (congestive) heart failure: Secondary | ICD-10-CM | POA: Diagnosis not present

## 2019-12-24 DIAGNOSIS — M1A9XX0 Chronic gout, unspecified, without tophus (tophi): Secondary | ICD-10-CM | POA: Diagnosis present

## 2019-12-24 DIAGNOSIS — N400 Enlarged prostate without lower urinary tract symptoms: Secondary | ICD-10-CM | POA: Diagnosis present

## 2019-12-24 DIAGNOSIS — E7800 Pure hypercholesterolemia, unspecified: Secondary | ICD-10-CM | POA: Diagnosis present

## 2019-12-24 DIAGNOSIS — I4510 Unspecified right bundle-branch block: Secondary | ICD-10-CM | POA: Diagnosis present

## 2019-12-24 DIAGNOSIS — Z20822 Contact with and (suspected) exposure to covid-19: Secondary | ICD-10-CM | POA: Diagnosis present

## 2019-12-24 DIAGNOSIS — R001 Bradycardia, unspecified: Secondary | ICD-10-CM | POA: Diagnosis not present

## 2019-12-24 DIAGNOSIS — I509 Heart failure, unspecified: Secondary | ICD-10-CM | POA: Diagnosis not present

## 2019-12-24 DIAGNOSIS — Z7982 Long term (current) use of aspirin: Secondary | ICD-10-CM | POA: Diagnosis not present

## 2019-12-24 DIAGNOSIS — Z85828 Personal history of other malignant neoplasm of skin: Secondary | ICD-10-CM | POA: Diagnosis not present

## 2019-12-24 HISTORY — PX: PACEMAKER IMPLANT: EP1218

## 2019-12-24 LAB — CBC
HCT: 36.1 % — ABNORMAL LOW (ref 39.0–52.0)
Hemoglobin: 13.4 g/dL (ref 13.0–17.0)
MCH: 36.4 pg — ABNORMAL HIGH (ref 26.0–34.0)
MCHC: 37.1 g/dL — ABNORMAL HIGH (ref 30.0–36.0)
MCV: 98.1 fL (ref 80.0–100.0)
Platelets: 79 10*3/uL — ABNORMAL LOW (ref 150–400)
RBC: 3.68 MIL/uL — ABNORMAL LOW (ref 4.22–5.81)
RDW: 14.2 % (ref 11.5–15.5)
WBC: 5.7 10*3/uL (ref 4.0–10.5)
nRBC: 0 % (ref 0.0–0.2)

## 2019-12-24 LAB — ECHOCARDIOGRAM COMPLETE
AR max vel: 2.2 cm2
AV Area VTI: 2.15 cm2
AV Area mean vel: 2.03 cm2
AV Mean grad: 4 mmHg
AV Peak grad: 8 mmHg
Ao pk vel: 1.41 m/s
Area-P 1/2: 4.06 cm2
Height: 70 in
S' Lateral: 2.75 cm
Weight: 3571.2 oz

## 2019-12-24 LAB — GLUCOSE, CAPILLARY
Glucose-Capillary: 85 mg/dL (ref 70–99)
Glucose-Capillary: 99 mg/dL (ref 70–99)
Glucose-Capillary: 118 mg/dL — ABNORMAL HIGH (ref 70–99)
Glucose-Capillary: 134 mg/dL — ABNORMAL HIGH (ref 70–99)

## 2019-12-24 LAB — BASIC METABOLIC PANEL
Anion gap: 9 (ref 5–15)
BUN: 21 mg/dL (ref 8–23)
CO2: 21 mmol/L — ABNORMAL LOW (ref 22–32)
Calcium: 9 mg/dL (ref 8.9–10.3)
Chloride: 107 mmol/L (ref 98–111)
Creatinine, Ser: 1.35 mg/dL — ABNORMAL HIGH (ref 0.61–1.24)
GFR, Estimated: 57 mL/min — ABNORMAL LOW (ref >60)
Glucose, Bld: 96 mg/dL (ref 70–99)
Potassium: 3.9 mmol/L (ref 3.5–5.1)
Sodium: 137 mmol/L (ref 135–145)

## 2019-12-24 SURGERY — PACEMAKER IMPLANT
Anesthesia: Choice

## 2019-12-24 MED ORDER — FENTANYL CITRATE (PF) 100 MCG/2ML IJ SOLN
INTRAMUSCULAR | Status: DC | PRN
  Administered 2019-12-24: 50 ug via INTRAVENOUS

## 2019-12-24 MED ORDER — CEFAZOLIN SODIUM-DEXTROSE 2-4 GM/100ML-% IV SOLN
2.0000 g | INTRAVENOUS | Status: AC
  Administered 2019-12-24: 08:00:00 2 g via INTRAVENOUS
  Filled 2019-12-24: qty 100

## 2019-12-24 MED ORDER — MIDAZOLAM HCL 2 MG/2ML IJ SOLN
INTRAMUSCULAR | Status: DC | PRN
  Administered 2019-12-24 (×2): 1 mg via INTRAVENOUS

## 2019-12-24 MED ORDER — FENTANYL CITRATE (PF) 100 MCG/2ML IJ SOLN
INTRAMUSCULAR | Status: AC
  Filled 2019-12-24: qty 2

## 2019-12-24 MED ORDER — CHLORHEXIDINE GLUCONATE 4 % EX LIQD: 60.0000 mL | Freq: Once | CUTANEOUS | Status: DC

## 2019-12-24 MED ORDER — LIDOCAINE HCL (PF) 1 % IJ SOLN
INTRAMUSCULAR | Status: DC | PRN
  Administered 2019-12-24: 30 mL

## 2019-12-24 MED ORDER — LIDOCAINE HCL (PF) 1 % IJ SOLN
INTRAMUSCULAR | Status: AC
  Filled 2019-12-24: qty 30

## 2019-12-24 MED ORDER — SODIUM CHLORIDE 0.9 % IV SOLN
80.0000 mg | INTRAVENOUS | Status: AC
  Administered 2019-12-24: 09:00:00 80 mg
  Filled 2019-12-24: qty 80

## 2019-12-24 MED ORDER — TRAMADOL HCL 50 MG PO TABS
50.0000 mg | ORAL_TABLET | Freq: Four times a day (QID) | ORAL | Status: DC | PRN
  Administered 2019-12-24 – 2019-12-25 (×2): 50 mg via ORAL
  Filled 2019-12-24 (×2): qty 1

## 2019-12-24 MED ORDER — AMLODIPINE BESYLATE 5 MG PO TABS
5.0000 mg | ORAL_TABLET | Freq: Every day | ORAL | Status: DC
  Administered 2019-12-25: 09:00:00 5 mg via ORAL
  Filled 2019-12-24: qty 1

## 2019-12-24 MED ORDER — POVIDONE-IODINE 10 % EX SWAB
2.0000 "application " | Freq: Once | CUTANEOUS | Status: DC
  Filled 2019-12-24: qty 2

## 2019-12-24 MED ORDER — MIDAZOLAM HCL 2 MG/2ML IJ SOLN
INTRAMUSCULAR | Status: AC
  Filled 2019-12-24: qty 2

## 2019-12-24 MED ORDER — CHLORHEXIDINE GLUCONATE CLOTH 2 % EX PADS
6.0000 | MEDICATED_PAD | Freq: Every day | CUTANEOUS | Status: DC
  Administered 2019-12-25: 10:00:00 6 via TOPICAL

## 2019-12-24 MED ORDER — SODIUM CHLORIDE 0.9 % IV SOLN: INTRAVENOUS | Status: DC

## 2019-12-24 MED ORDER — PROPOFOL 10 MG/ML IV BOLUS
INTRAVENOUS | Status: AC
  Filled 2019-12-24: qty 20

## 2019-12-24 SURGICAL SUPPLY — 14 items
CABLE SURG 12 DISP A/V CHANNEL (MISCELLANEOUS) ×6 IMPLANT
DEVICE DSSCT PLSMBLD 3.0S LGHT (MISCELLANEOUS) ×1 IMPLANT
INTRO PACEMKR SHEATH II 7FR (MISCELLANEOUS) ×6
INTRODUCER PACEMKR SHTH II 7FR (MISCELLANEOUS) ×2 IMPLANT
IPG PACE AZUR XT DR MRI W1DR01 (Pacemaker) ×1 IMPLANT
LEAD CAPSURE NOVUS 5076-52CM (Lead) ×3 IMPLANT
LEAD CAPSURE NOVUS 5076-58CM (Lead) ×3 IMPLANT
PACE AZURE XT DR MRI W1DR01 (Pacemaker) ×3 IMPLANT
PACK PACE INSERTION (MISCELLANEOUS) ×3 IMPLANT
PAD ELECT DEFIB RADIOL ZOLL (MISCELLANEOUS) ×3 IMPLANT
PLASMABLADE 3.0S W/LIGHT (MISCELLANEOUS) ×3
SUT DVC V-LOC 4-0 90 CLR P-12 (SUTURE) ×6
SUT DVC VLOC 3-0 CL 6 P-12 (SUTURE) ×6 IMPLANT
SUTURE DVC V-LC4-0 90 CLR P-12 (SUTURE) ×2 IMPLANT

## 2019-12-24 NOTE — Progress Notes (Signed)
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 Continue to hold carvedilol. Continue on tele. Cardio following and recs apprec  Acute CHF: unknown systolic vs diastolic vs combined. Echo pending. Continue on IV lasix.   AKI vs CKD: baseline Cr/GFR is unknown. Currently IIIa. Cr is trending up from day prior. Will continue to monitor.   HTN: continue on lisinopril   DM2: continue on SSI w/ accuchecks   Thrombocytopenia: chronic. Will continue to monitor   Gout: continue on home dose of colchicine, febuxostat  Dyslipidemia: continue on statin     DVT prophylaxis: SCDs Code Status: full  Family Communication: dicussed pt's care with pt's family at bedside and answered their questions  Disposition Plan: likely d/c back home   Status is: Inpatient  Remains inpatient appropriate because:Ongoing diagnostic testing needed not appropriate for outpatient work up and Inpatient level of care appropriate due to severity of illness   Dispo: The patient is from: Home              Anticipated d/c is to: Home              Anticipated d/c date is: 1 day              Patient currently is not medically stable to d/c.         Consultants:  Cardio   Procedures:   Antimicrobials:   Subjective: Pt c/o malaise  Objective: Vitals:   12/24/19 0500 12/24/19 0600 12/24/19 0700 12/24/19 0742  BP: (!) 181/60 (!) 164/63 (!) 182/71 (!) 191/70  Pulse: (!) 37 (!) 40 (!) 32 (!) 34  Resp: (!) 24 (!) 23 14 17   Temp:    97.9 F (36.6 C)  TempSrc:    Oral  SpO2: 96% 99% 98% 98%   Weight:    98.4 kg  Height:    5\' 10"  (1.778 m)   No intake or output data in the 24 hours ending 12/24/19 0823 Filed Weights   12/24/19 0742  Weight: 98.4 kg    Examination:  General exam: Appears calm and comfortable  Respiratory system: Clear to auscultation. Respiratory effort normal. Cardiovascular system: bradycardia. No rubs, gallops or clicks. Gastrointestinal system: Abdomen is nondistended, soft and nontender. Normal bowel sounds heard. Central nervous system: Alert and oriented. Moves all 4 extremities Psychiatry: Judgement and insight appear normal. Mood & affect appropriate.     Data Reviewed: I have personally reviewed following labs and imaging studies  CBC: Recent Labs  Lab 12/23/19 1102 12/24/19 0637  WBC 5.9 5.7  HGB 13.9 13.4  HCT 37.9* 36.1*  MCV 98.7 98.1  PLT 83* 79*   Basic Metabolic Panel: Recent Labs  Lab 12/23/19 1242 12/24/19 0637  NA 139 137  K 4.1 3.9  CL 110 107  CO2 20* 21*  GLUCOSE 113* 96  BUN 19 21  CREATININE 1.27* 1.35*  CALCIUM 8.9 9.0   GFR: Estimated Creatinine Clearance: 61.6 mL/min (A) (by C-G formula based on SCr of 1.35 mg/dL (H)). Liver Function Tests: Recent Labs  Lab 12/23/19 1242  AST 28  ALT 14  ALKPHOS 53  BILITOT 3.0*  PROT 5.3*  ALBUMIN 2.8*   No results for input(s): LIPASE, AMYLASE in the last 168 hours. No results for input(s): AMMONIA in the last 168 hours. Coagulation Profile: No results for input(s): INR, PROTIME in the last 168 hours. Cardiac Enzymes: No results for input(s): CKTOTAL, CKMB, CKMBINDEX, TROPONINI in the last 168 hours. BNP (last 3 results) No results for input(s): PROBNP in the last 8760 hours. HbA1C: Recent Labs    12/23/19 1537  HGBA1C 5.0   CBG: Recent Labs  Lab 12/23/19 1700 12/23/19 2155  GLUCAP 127* 134*   Lipid Profile: No results for input(s): CHOL, HDL, LDLCALC, TRIG, CHOLHDL, LDLDIRECT in the last 72 hours. Thyroid Function Tests: Recent Labs     12/23/19 1537  TSH 3.053   Anemia Panel: No results for input(s): VITAMINB12, FOLATE, FERRITIN, TIBC, IRON, RETICCTPCT in the last 72 hours. Sepsis Labs: No results for input(s): PROCALCITON, LATICACIDVEN in the last 168 hours.  Recent Results (from the past 240 hour(s))  Resp Panel by RT-PCR (Flu A&B, Covid) Nasopharyngeal Swab     Status: None   Collection Time: 12/23/19 12:08 PM   Specimen: Nasopharyngeal Swab; Nasopharyngeal(NP) swabs in vial transport medium  Result Value Ref Range Status   SARS Coronavirus 2 by RT PCR NEGATIVE NEGATIVE Final    Comment: (NOTE) SARS-CoV-2 target nucleic acids are NOT DETECTED.  The SARS-CoV-2 RNA is generally detectable in upper respiratory specimens during the acute phase of infection. The lowest concentration of SARS-CoV-2 viral copies this assay can detect is 138 copies/mL. A negative result does not preclude SARS-Cov-2 infection and should not be used as the sole basis for treatment or other patient management decisions. A negative result may occur with  improper specimen collection/handling, submission of specimen other than nasopharyngeal swab, presence of viral mutation(s) within the areas targeted by this assay, and inadequate number of viral copies(<138 copies/mL). A negative result must be combined with clinical observations, patient history, and epidemiological information. The expected result is Negative.  Fact Sheet for Patients:  EntrepreneurPulse.com.au  Fact Sheet for Healthcare Providers:  IncredibleEmployment.be  This test is no t yet approved or cleared by the [place] FDA and  has been authorized for detection and/or diagnosis of SARS-CoV-2 by FDA under an Emergency Use Authorization (EUA). This EUA will remain  in effect (meaning this test can be used) for the duration of the COVID-19 declaration under Section 564(b)(1) of the Act, 21 U.S.C.section 360bbb-3(b)(1), unless the  authorization is terminated  or revoked sooner.       Influenza A by PCR NEGATIVE NEGATIVE Final   Influenza B by PCR NEGATIVE NEGATIVE Final    Comment: (NOTE) The Xpert Xpress SARS-CoV-2/FLU/RSV plus assay is intended as an aid in the diagnosis of influenza from Nasopharyngeal swab specimens and should not be used as a sole basis for treatment. Nasal washings and aspirates are unacceptable for Xpert Xpress SARS-CoV-2/FLU/RSV testing.  Fact Sheet for Patients: EntrepreneurPulse.com.au  Fact Sheet for Healthcare Providers: IncredibleEmployment.be  This test is not yet approved or cleared by the [place] FDA and has been authorized for detection and/or diagnosis of SARS-CoV-2 by FDA under an Emergency Use Authorization (EUA). This EUA will remain in effect (meaning this test can be used) for the duration of the COVID-19 declaration under Section 564(b)(1) of the Act, 21 U.S.C. section 360bbb-3(b)(1), unless the authorization is terminated or revoked.  Performed at [facility], [address] [place], [place] [address]  Radiology Studies: DG Chest 2 View  Result Date: 12/23/2019 CLINICAL DATA:  Chest pain shortness of breath EXAM: CHEST - 2 VIEW COMPARISON:  None. FINDINGS: Moderate left and small right pleural effusions. Overlying left basilar opacities. No visible pneumothorax. Cardiomediastinal silhouette is within normal limits. Aortic atherosclerosis. Polyarticular degenerative change. No acute osseous abnormality. IMPRESSION: Moderate left and small right pleural effusions. Overlying left basilar opacities may represent atelectasis or pneumonia. Electronically Signed   By: Margaretha Sheffield MD   On: 12/23/2019 10:51        Scheduled Meds: . [MAR Hold] amLODipine  5 mg Oral Daily  . [MAR Hold] aspirin EC  81 mg Oral Daily  . [MAR Hold] Chlorhexidine Gluconate Cloth  6 each Topical Daily  . [MAR Hold]  cholecalciferol  2,000 Units Oral Daily  . [MAR Hold] colchicine  0.6 mg Oral Daily  . [MAR Hold] dutasteride  0.5 mg Oral q morning - 10a  . [MAR Hold] febuxostat  40 mg Oral Daily  . [MAR Hold] furosemide  40 mg Intravenous Daily  . gentamicin irrigation  80 mg Irrigation On Call  . [MAR Hold] insulin aspart  0-15 Units Subcutaneous TID WC  . [MAR Hold] insulin aspart  0-5 Units Subcutaneous QHS  . [MAR Hold] lisinopril  20 mg Oral Daily  . [MAR Hold] omega-3 acid ethyl esters  1 g Oral Daily  . povidone-iodine  2 application Topical Once  . [MAR Hold] pravastatin  10 mg Oral q1800  . [MAR Hold] sodium chloride flush  3 mL Intravenous Q12H  . [MAR Hold] sodium chloride flush  3 mL Intravenous Q12H   Continuous Infusions: . [MAR Hold] sodium chloride    . sodium chloride 50 mL/hr at 12/24/19 0753     LOS: 0 days    Time spent: 30 mins    Wyvonnia Dusky, MD [facility] Pager [phone]  If 7PM-7AM, please contact night-coverage 12/24/2019, 8:23 AM

## 2019-12-24 NOTE — Progress Notes (Signed)
Patient Name: Dean Lynch Date of Encounter: 12/24/2019  Hospital Problem List     Principal Problem:   Bradycardia Active Problems:   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 tophus   Diabetes mellitus type 2, uncomplicated (HCC)   Essential hypertension   Thrombocytopenia (HCC)   Acute CHF (congestive heart failure) [facility])    Patient Profile      69 y.o. male with history of diabetes, hyperlipidemia, hypertension who presented to emergency room with complaints of dizziness fatigue and occasional chest pain.  He is a very difficult historian and his wife who is with him is also somewhat unclear regarding his medications however per chart he is on carvedilol 3.125 to 6.25 mg twice daily.  Also is on enteric-coated aspirin.  He is also on lisinopril hydrochlorothiazide for his blood pressure.  In the emergency room he was noted to be bradycardic.  Electrocardiogram showed second-degree heart block with 2-1 conduction a ventricular rate of 37.  There have been no pauses.  He is hemodynamically stable.  His wife and he states he has been feeling somewhat sluggish for several days to weeks.  Review of the chart did does not know bradycardia and any of his outside visits recently however.  Troponin was unremarkable.  Electrolytes show a creatinine of 1.27 improved from 1.442 weeks ago.  Potassium is normal.  Sed rate is 5.  He denies syncope but has had dizziness.  Subjective   Remains bradycardic. Heart rate in the 35 range.  Inpatient Medications    . aspirin EC  81 mg Oral Daily  . chlorhexidine  60 mL Topical Once  . chlorhexidine  60 mL Topical Once  . cholecalciferol  2,000 Units Oral Daily  . colchicine  0.6 mg Oral Daily  . dutasteride  0.5 mg Oral q morning - 10a  . febuxostat  40 mg Oral Daily  . furosemide  40 mg Intravenous Daily  . gentamicin irrigation  80 mg Irrigation On Call  . insulin aspart  0-15 Units Subcutaneous TID WC  . insulin aspart  0-5 Units Subcutaneous QHS  .  lisinopril  20 mg Oral Daily  . omega-3 acid ethyl esters  1 g Oral Daily  . povidone-iodine  2 application Topical Once  . pravastatin  10 mg Oral q1800  . sodium chloride flush  3 mL Intravenous Q12H  . sodium chloride flush  3 mL Intravenous Q12H    Vital Signs    Vitals:   12/24/19 0300 12/24/19 0500 12/24/19 0600 12/24/19 0700  BP: (!) 171/60 (!) 181/60 (!) 164/63 (!) 182/71  Pulse: (!) 40 (!) 37 (!) 40 (!) 32  Resp: 19 (!) 24 (!) 23 14  Temp:      TempSrc:      SpO2: 96% 96% 99% 98%   No intake or output data in the 24 hours ending 12/24/19 0724 There were no vitals filed for this visit.  Physical Exam    GEN: Well nourished, well developed, in no acute distress.  HEENT: normal.  Neck: Supple, no JVD, carotid bruits, or masses. Cardiac: RRR, no murmurs, rubs, or gallops. No clubbing, cyanosis, edema.  Radials/DP/PT 2+ and equal bilaterally.  Respiratory:  Respirations regular and unlabored, clear to auscultation bilaterally. GI: Soft, nontender, nondistended, BS + x 4. MS: no deformity or atrophy. Skin: warm and dry, no rash. Neuro:  Strength and sensation are intact. Psych: Normal affect.  Labs    CBC Recent Labs    12/23/19 1102 12/24/19 2482  WBC 5.9 5.7  HGB 13.9 13.4  HCT 37.9* 36.1*  MCV 98.7 98.1  PLT 83* 79*   Basic Metabolic Panel Recent Labs    12/23/19 1242 12/24/19 0637  NA 139 137  K 4.1 3.9  CL 110 107  CO2 20* 21*  GLUCOSE 113* 96  BUN 19 21  CREATININE 1.27* 1.35*  CALCIUM 8.9 9.0   Liver Function Tests Recent Labs    12/23/19 1242  AST 28  ALT 14  ALKPHOS 53  BILITOT 3.0*  PROT 5.3*  ALBUMIN 2.8*   No results for input(s): LIPASE, AMYLASE in the last 72 hours. Cardiac Enzymes No results for input(s): CKTOTAL, CKMB, CKMBINDEX, TROPONINI in the last 72 hours. BNP Recent Labs    12/23/19 1851  BNP 220.7*   D-Dimer No results for input(s): DDIMER in the last 72 hours. Hemoglobin A1C Recent Labs    12/23/19 1537   HGBA1C 5.0   Fasting Lipid Panel No results for input(s): CHOL, HDL, LDLCALC, TRIG, CHOLHDL, LDLDIRECT in the last 72 hours. Thyroid Function Tests Recent Labs    12/23/19 1537  TSH 3.053    Telemetry    Ventricular rate in 35 with second degree heart block.   ECG    Second degree heart block  Radiology    DG Chest 2 View  Result Date: 12/23/2019 CLINICAL DATA:  Chest pain shortness of breath EXAM: CHEST - 2 VIEW COMPARISON:  None. FINDINGS: Moderate left and small right pleural effusions. Overlying left basilar opacities. No visible pneumothorax. Cardiomediastinal silhouette is within normal limits. Aortic atherosclerosis. Polyarticular degenerative change. No acute osseous abnormality. IMPRESSION: Moderate left and small right pleural effusions. Overlying left basilar opacities may represent atelectasis or pneumonia. Electronically Signed   By: Margaretha Sheffield MD   On: 12/23/2019 10:51    Assessment & Plan    69 year old male with history of hypertension who presented to the emergency room with complaints of dizziness nausea and occasional chest pain.  EKG showed second-degree heart block with 2-1 conduction ventricular rate of approximately 35-38.  Hemodynamically stable.  Etiology of this is unclear.  Patient most likely is on at least 3.125 if not 6.25 of carvedilol twice daily.  He is unaware if he is taking this or not but his outpatient medical lists included.  No significant electrolyte abnormalities noted.  No ischemic changes on electrocardiogram and negative high-sensitivity troponin.  1.  Bradycardia-secondary to second-degree heart block.  Remains bradycardic. Continued high grade heart block. For dual chamber ppm per Dr. Mylinda Latina this am. Will watch overnight and plan discharge in the am.   2.  Hypertension-we will need to treat with non-AV nodal related medications for now including lisinopril. Will add amlodipine and will be able to try back on carvedilol  when pacer is placed.   3.  Diabetes-continue with oral agents.  Signed, Javier Docker Nesiah Jump MD 12/24/2019, 7:24 AM  Pager: [phone]

## 2019-12-24 NOTE — Progress Notes (Signed)
*  PRELIMINARY RESULTS* Echocardiogram 2D Echocardiogram has been performed.  Dean, Lynch 12/24/2019, 2:01 PM

## 2019-12-24 NOTE — Progress Notes (Signed)
Patient arrived from cath lab, s/p DDD pacer 60/130 Dean Lynch, [employer] rep spoke with patient and patient's wife regarding pacemaker, set up for home, both verbalize understanding.  Patient to be admitted overnight, Dr. Ubaldo Glassing spoke to patient and patient's wife. Vitals stable, paced rhythm on monitor. Dressing to left upper chest wall c/d/i  +radial pulse intact.  Tolerating water without event.

## 2019-12-24 NOTE — Progress Notes (Signed)
Report called to Levonne Spiller, accepting nurse.  Patient awake/alert  No c/o's   Wife at bedside, both tolerating cup of coffee. Remains paced

## 2019-12-25 DIAGNOSIS — I5031 Acute diastolic (congestive) heart failure: Secondary | ICD-10-CM

## 2019-12-25 DIAGNOSIS — I10 Essential (primary) hypertension: Secondary | ICD-10-CM

## 2019-12-25 LAB — CBC
HCT: 33.9 % — ABNORMAL LOW (ref 39.0–52.0)
Hemoglobin: 12.4 g/dL — ABNORMAL LOW (ref 13.0–17.0)
MCH: 36.2 pg — ABNORMAL HIGH (ref 26.0–34.0)
MCHC: 36.6 g/dL — ABNORMAL HIGH (ref 30.0–36.0)
MCV: 98.8 fL (ref 80.0–100.0)
Platelets: 71 10*3/uL — ABNORMAL LOW (ref 150–400)
RBC: 3.43 MIL/uL — ABNORMAL LOW (ref 4.22–5.81)
RDW: 14.2 % (ref 11.5–15.5)
WBC: 5.8 10*3/uL (ref 4.0–10.5)
nRBC: 0 % (ref 0.0–0.2)

## 2019-12-25 LAB — BASIC METABOLIC PANEL
Anion gap: 6 (ref 5–15)
BUN: 20 mg/dL (ref 8–23)
CO2: 23 mmol/L (ref 22–32)
Calcium: 8.7 mg/dL — ABNORMAL LOW (ref 8.9–10.3)
Chloride: 107 mmol/L (ref 98–111)
Creatinine, Ser: 1.24 mg/dL (ref 0.61–1.24)
GFR, Estimated: >60 mL/min (ref >60)
Glucose, Bld: 95 mg/dL (ref 70–99)
Potassium: 3.9 mmol/L (ref 3.5–5.1)
Sodium: 136 mmol/L (ref 135–145)

## 2019-12-25 LAB — GLUCOSE, CAPILLARY
Glucose-Capillary: 92 mg/dL (ref 70–99)
Glucose-Capillary: 123 mg/dL — ABNORMAL HIGH (ref 70–99)

## 2019-12-25 MED ORDER — HYDROCHLOROTHIAZIDE 12.5 MG PO CAPS
12.5000 mg | ORAL_CAPSULE | Freq: Every day | ORAL | Status: DC
  Administered 2019-12-25: 11:00:00 12.5 mg via ORAL
  Filled 2019-12-25: qty 1

## 2019-12-25 MED ORDER — CARVEDILOL 6.25 MG PO TABS: 6.2500 mg | ORAL_TABLET | Freq: Two times a day (BID) | ORAL | Status: DC

## 2019-12-25 MED ORDER — PRAVASTATIN SODIUM 10 MG PO TABS: 10.0000 mg | ORAL_TABLET | Freq: Every day | ORAL | 0 refills | Status: DC

## 2019-12-25 MED ORDER — AMLODIPINE BESYLATE 5 MG PO TABS: 5.0000 mg | ORAL_TABLET | Freq: Every day | ORAL | 0 refills | Status: DC

## 2019-12-25 MED ORDER — COLCHICINE 0.6 MG PO TABS: 0.3000 mg | ORAL_TABLET | ORAL | Status: DC

## 2019-12-25 MED ORDER — COLCHICINE 0.6 MG PO TABS: 0.3000 mg | ORAL_TABLET | ORAL | 0 refills | Status: DC

## 2019-12-25 MED ORDER — CARVEDILOL 6.25 MG PO TABS: 6.2500 mg | ORAL_TABLET | Freq: Two times a day (BID) | ORAL | 0 refills | Status: DC

## 2019-12-25 NOTE — Discharge Summary (Signed)
Physician Discharge Summary  Dean Lynch TTS:[id number] DOB: November 17, 1950 DOA: 12/23/2019  PCP: Toni Arthurs, NP  Admit date: 12/23/2019 Discharge date: 12/25/2019  Admitted From: home  Disposition:  Home   Recommendations for Outpatient Follow-up:  1. Follow up with PCP in 1-2 weeks 2. F/u w/ cardio, Dr. Ubaldo Glassing, within 1 week   Home Health: no  Equipment/Devices:  Discharge Condition: stable  CODE STATUS: full  Diet recommendation: Heart Healthy / Carb Modified  Brief/Interim Summary: HPI was taken from Dr. Francine Graven: Dean Lynch is a 69 y.o. male with medical history significant for type 2 diabetes mellitus, hypertension, dyslipidemia who was sent to the emergency room from the urgent care clinic where he went for evaluation of chest discomfort and shortness of breath.  Patient states that he has had symptoms for about a week and half and his wife notes that his symptoms have progressively worsened over the last 1 week. He gets out of breath just walking to the mailbox and back and notes that he is increasingly fatigued with minimal exertion.  He has bilateral lower extremity swelling and describes a midsternal chest discomfort not related to rest or exertion and not associated with nausea, no vomiting, no diaphoresis or palpitations. He had gone to the urgent care center for these symptoms  and they noted he was bradycardic with heart rate in the 40's.  He complains of intermittent dizziness lightheadedness but denies any loss of consciousness or fall. He has a cough which is nonproductive denies having any fever or chills.  He has no abdominal pain, no changes in his bowel habits, no urinary symptoms, no headaches, no blurry vision or mental status changes. Labs show sodium 139, potassium 4.1, chloride 110, bicarb 20, glucose 113, BUN 19, creatinine 1.37, calcium 8.9, alkaline phosphatase 53, albumin 3.8, AST 28, ALT 14, total protein 5.3, troponin 29 >> 30, white count 5.9,  hemoglobin 13.9, hematocrit 37.9, MCV 98.7, RDW 14.4, platelet count 83, Respiratory viral panel is negative Chest x-ray reviewed by me showed bilateral pleural effusions (Lt > Rt) Twelve-lead EKG reviewed by me shows sinus rhythm with a second-degree AV block with 2 1 conduction Right bundle branch block     ED Course: Patient is a 69 year old Caucasian male who was sent to the emergency room from the urgent care center for evaluation of bradycardia.  Patient noted to have acute CHF and will be admitted to the hospital for further evaluation.  Hospital Course from Dr. Jimmye Norman 12/17-12/18/21: Pt presented w/ bradycardia and was found to have second degree heart block. Pt had a dual chamber pacemaker placed on 12/24/19. Pt tolerated the procedure fairly well. Pt was d/c home w/ coreg, amlodipine, lisinopril/HCTZ, & pravastatin as per cardio. For more information, please see previous progress/consult notes.   Discharge Diagnoses:  Principal Problem:   Bradycardia Active Problems:   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 tophus   Diabetes mellitus type 2, uncomplicated (HCC)   Essential hypertension   Thrombocytopenia (HCC)   Acute CHF (congestive heart failure) (HCC)  Bradycardia: secondary to second-degree heart block. S/p dual chamber pacemaker 12/24/19. Restart coreg and started on amlodipine as per cardio. Continue on tele. Cardio following and recs apprec  Acute diastolic CHF: Echo shows EF 09-23%, grade I diastolic dysfunction & no wall motion abnormalities. Continue to monitor I/Os.  AKI vs CKD: baseline Cr/GFR is unknown. Currently IIIa.Cr continues to trend down daily   HTN: continue on lisinopril/HCTZ  DM2: continue on SSI w/ accuchecks   Thrombocytopenia: chronic.  Will continue to monitor   Gout: continue on home dose of colchicine, febuxostat  Dyslipidemia: continue on statin   Discharge Instructions  Discharge Instructions    Diet - low sodium heart healthy   Complete  by: As directed    Discharge instructions   Complete by: As directed    F/u w/ PCP in 2 weeks. F/u w/ cardio, Dr. Ubaldo Glassing within 1 week   Increase activity slowly   Complete by: As directed      Allergies as of 12/25/2019      Reactions   Fenofibrate Micronized Other (See Comments)   Elevated Liver Enzymes      Medication List    STOP taking these medications   lovastatin 10 MG tablet Commonly known as: MEVACOR Replaced by: pravastatin 10 MG tablet     TAKE these medications   amLODipine 5 MG tablet Commonly known as: NORVASC Take 1 tablet (5 mg total) by mouth daily. Start taking on: December 26, 2019   aspirin EC 81 MG tablet Take 81 mg by mouth daily.   carvedilol 6.25 MG tablet Commonly known as: COREG Take 1 tablet (6.25 mg total) by mouth 2 (two) times daily with a meal.   clobetasol ointment 0.05 % Commonly known as: TEMOVATE Apply 1 application topically 2 (two) times daily as needed (skin irritations).   colchicine 0.6 MG tablet Take 0.5 tablets (0.3 mg total) by mouth every other day. Start taking on: December 27, 2019 What changed:   how much to take  when to take this   dutasteride 0.5 MG capsule Commonly known as: AVODART Take 0.5 mg by mouth every morning.   febuxostat 40 MG tablet Commonly known as: ULORIC Take 40 mg by mouth daily.   Fish Oil 1000 MG Caps Take 1,000 mg by mouth daily.   hydrocortisone 2.5 % ointment Apply 1 application topically daily.   ketoconazole 2 % cream Commonly known as: NIZORAL Apply 1 application topically daily.   lisinopril-hydrochlorothiazide 20-12.5 MG tablet Commonly known as: ZESTORETIC Take 1 tablet by mouth every morning.   magnesium oxide 400 MG tablet Commonly known as: MAG-OX Take 400 mg by mouth 2 (two) times daily.   pravastatin 10 MG tablet Commonly known as: PRAVACHOL Take 1 tablet (10 mg total) by mouth daily at 6 PM. Replaces: lovastatin 10 MG tablet   traZODone 50 MG  tablet Commonly known as: DESYREL Take 50 mg by mouth at bedtime.   Vitamin D3 50 MCG (2000 UT) capsule Take 4,000 Units by mouth daily.       Follow-up Information    Teodoro Spray, MD Follow up in 1 week(s).   Specialty: Cardiology Contact information: [address] [place] [address] [phone]              Allergies  Allergen Reactions  . Fenofibrate Micronized Other (See Comments)    Elevated Liver Enzymes    Consultations: Cardio, Dr. Ubaldo Glassing   Procedures/Studies: DG Chest 2 View  Result Date: 12/23/2019 CLINICAL DATA:  Chest pain shortness of breath EXAM: CHEST - 2 VIEW COMPARISON:  None. FINDINGS: Moderate left and small right pleural effusions. Overlying left basilar opacities. No visible pneumothorax. Cardiomediastinal silhouette is within normal limits. Aortic atherosclerosis. Polyarticular degenerative change. No acute osseous abnormality. IMPRESSION: Moderate left and small right pleural effusions. Overlying left basilar opacities may represent atelectasis or pneumonia. Electronically Signed   By: Margaretha Sheffield MD   On: 12/23/2019 10:51   EP PPM/ICD IMPLANT  Result Date: 12/24/2019 TEREL BANN [id number]  @CSN @ Preop Dx: AV block second degree Postop Dx CHB Procedure: dual chamber pacemaker insertion After routine prep and drape, lidocaine was infiltrated in the prepectoral subclavicular region on the left side an incision was made and carried down to layer of  the prepectoral fascia using electrocautery and sharp dissection;  a pocket was formed similarly. Hemostasis was obtained. After this, we turned our attention to gaining access to the extrathoracic,left subclavian vein. This was accomplished without difficulty and without the aspiration of air or puncture of the artery. 2 separate venipunctures were accomplished; guidewires were placed and retained and sequentially 7 French sheaths  were placed through which were passed a  MDT 5076  ventricular lead,  and a   MDT 5076  atrial lead, . The ventricular lead was manipulated to the right ventricular apex where the bipolar R wave was 7.23mv, the pacing impedance was 1056,  the threshold was  0.9 V  @ 0.4 msec The right atrial lead was manipulated to the right atrial appendage where the bipolar P-wave was 20mv, the pacing impedance was 865, the threshold was 0.8 V @ 0.4 msec.  The leads were affixed to the prepectoral fascia and attached to a  MDT pulse generator. Hemostasis was obtained. The pocket was copiously irrigated with antibiotic containing saline solution. The leads and the pulse generator were placed in the pocket and affixed to the prepectoral fascia. The wound iwas then closed in 3 layers in normal fashion. The wound was washed dried and steri strips applied.  Needle count, sponge count  and instrument counts were correct at the end of the procedure. The patient tolerated the procedure without apparent complication. EBL  <50cc's   ECHOCARDIOGRAM COMPLETE  Result Date: 12/24/2019    ECHOCARDIOGRAM REPORT   Patient Name:   ASTOR GENTLE Date of Exam: 12/24/2019 Medical Rec #:  [id number]         Height:       70.0 in Accession #:    [id number]        Weight:       223.2 lb Date of Birth:  30-Nov-1950         BSA:          2.187 m Patient Age:    69 years          BP:           156/76 mmHg Patient Gender: M                 HR:           71 bpm. Exam Location:  [facility] Procedure: 2D Echo, Cardiac Doppler and Color Doppler Indications:     CHF-acute diastolic 902.40  History:         Patient has no prior history of Echocardiogram examinations.                  Risk Factors:Hypertension and Diabetes.  Sonographer:     Sherrie Sport RDCS (AE) Referring Phys:  [id number] Royce Macadamia AGBATA Diagnosing Phys: Bartholome Bill MD IMPRESSIONS  1. Left ventricular ejection fraction, by estimation, is 65 to 70%. The left ventricle has normal function. The left ventricle has no regional wall motion abnormalities.  There is moderate left ventricular hypertrophy. Left ventricular diastolic parameters are consistent with Grade I diastolic dysfunction (impaired relaxation).  2. Right ventricular systolic function is normal. The right ventricular size is normal.  3. The mitral valve is  grossly normal. Trivial mitral valve regurgitation.  4. The aortic valve was not well visualized. Aortic valve regurgitation is not visualized. FINDINGS  Left Ventricle: Left ventricular ejection fraction, by estimation, is 65 to 70%. The left ventricle has normal function. The left ventricle has no regional wall motion abnormalities. The left ventricular internal cavity size was normal in size. There is  moderate left ventricular hypertrophy. Left ventricular diastolic parameters are consistent with Grade I diastolic dysfunction (impaired relaxation). Right Ventricle: The right ventricular size is normal. No increase in right ventricular wall thickness. Right ventricular systolic function is normal. Left Atrium: Left atrial size was normal in size. Right Atrium: Right atrial size was normal in size. Pericardium: There is no evidence of pericardial effusion. Mitral Valve: The mitral valve is grossly normal. Trivial mitral valve regurgitation. Tricuspid Valve: The tricuspid valve is not well visualized. Tricuspid valve regurgitation is trivial. Aortic Valve: The aortic valve was not well visualized. Aortic valve regurgitation is not visualized. Aortic valve mean gradient measures 4.0 mmHg. Aortic valve peak gradient measures 8.0 mmHg. Aortic valve area, by VTI measures 2.15 cm. Pulmonic Valve: The pulmonic valve was not well visualized. Pulmonic valve regurgitation is trivial. Aorta: The aortic root is normal in size and structure. IAS/Shunts: The atrial septum is grossly normal. Additional Comments: A pacer wire is visualized.  LEFT VENTRICLE PLAX 2D LVIDd:         4.63 cm  Diastology LVIDs:         2.75 cm  LV e' medial:    4.57 cm/s LV PW:          1.28 cm  LV E/e' medial:  20.6 LV IVS:        1.05 cm  LV e' lateral:   4.79 cm/s LVOT diam:     2.00 cm  LV E/e' lateral: 19.7 LV SV:         52 LV SV Index:   24 LVOT Area:     3.14 cm  RIGHT VENTRICLE RV S prime:     17.60 cm/s TAPSE (M-mode): 5.7 cm LEFT ATRIUM             Index       RIGHT ATRIUM           Index LA diam:        2.70 cm 1.23 cm/m  RA Area:     19.80 cm LA Vol (A2C):   67.6 ml 30.91 ml/m RA Volume:   51.90 ml  23.73 ml/m LA Vol (A4C):   42.7 ml 19.52 ml/m LA Biplane Vol: 52.1 ml 23.82 ml/m  AORTIC VALVE                   PULMONIC VALVE AV Area (Vmax):    2.20 cm    PV Vmax:        0.86 m/s AV Area (Vmean):   2.03 cm    PV Peak grad:   2.9 mmHg AV Area (VTI):     2.15 cm    RVOT Peak grad: 5 mmHg AV Vmax:           141.00 cm/s AV Vmean:          94.700 cm/s AV VTI:            0.242 m AV Peak Grad:      8.0 mmHg AV Mean Grad:      4.0 mmHg LVOT Vmax:         98.60  cm/s LVOT Vmean:        61.200 cm/s LVOT VTI:          0.166 m LVOT/AV VTI ratio: 0.69  AORTA Ao Root diam: 3.30 cm MITRAL VALVE                TRICUSPID VALVE MV Area (PHT): 4.06 cm     TR Peak grad:   12.4 mmHg MV Decel Time: 187 msec     TR Vmax:        176.00 cm/s MV E velocity: 94.30 cm/s MV A velocity: 132.00 cm/s  SHUNTS MV E/A ratio:  0.71         Systemic VTI:  0.17 m                             Systemic Diam: 2.00 cm Bartholome Bill MD Electronically signed by Bartholome Bill MD Signature Date/Time: 12/24/2019/3:13:41 PM    Final       Subjective: pt c/o fatigue    Discharge Exam: Vitals:   12/25/19 0328 12/25/19 0751  BP: (!) 169/87 (!) 167/82  Pulse: 76 76  Resp: 18 14  Temp: 97.9 F (36.6 C) 98.2 F (36.8 C)  SpO2: 94% 92%   Vitals:   12/24/19 1735 12/24/19 1931 12/25/19 0328 12/25/19 0751  BP: (!) 161/78 (!) 158/74 (!) 169/87 (!) 167/82  Pulse: 82 78 76 76  Resp:  18 18 14   Temp: 98.7 F (37.1 C) 98.7 F (37.1 C) 97.9 F (36.6 C) 98.2 F (36.8 C)  TempSrc: Oral Oral Oral Oral  SpO2: 94%  96% 94% 92%  Weight:   101.2 kg   Height:        General: Pt is alert, awake, not in acute distress Cardiovascular: S1/S2 +, no rubs, no gallops Respiratory: CTA bilaterally, no wheezing, no rhonchi Abdominal: Soft, NT, ND, bowel sounds + Extremities: no edema, no cyanosis    The results of significant diagnostics from this hospitalization (including imaging, microbiology, ancillary and laboratory) are listed below for reference.     Microbiology: Recent Results (from the past 240 hour(s))  Resp Panel by RT-PCR (Flu A&B, Covid) Nasopharyngeal Swab     Status: None   Collection Time: 12/23/19 12:08 PM   Specimen: Nasopharyngeal Swab; Nasopharyngeal(NP) swabs in vial transport medium  Result Value Ref Range Status   SARS Coronavirus 2 by RT PCR NEGATIVE NEGATIVE Final    Comment: (NOTE) SARS-CoV-2 target nucleic acids are NOT DETECTED.  The SARS-CoV-2 RNA is generally detectable in upper respiratory specimens during the acute phase of infection. The lowest concentration of SARS-CoV-2 viral copies this assay can detect is 138 copies/mL. A negative result does not preclude SARS-Cov-2 infection and should not be used as the sole basis for treatment or other patient management decisions. A negative result may occur with  improper specimen collection/handling, submission of specimen other than nasopharyngeal swab, presence of viral mutation(s) within the areas targeted by this assay, and inadequate number of viral copies(<138 copies/mL). A negative result must be combined with clinical observations, patient history, and epidemiological information. The expected result is Negative.  Fact Sheet for Patients:  EntrepreneurPulse.com.au  Fact Sheet for Healthcare Providers:  IncredibleEmployment.be  This test is no t yet approved or cleared by the [place] FDA and  has been authorized for detection and/or diagnosis of SARS-CoV-2 by FDA under  an Emergency Use Authorization (EUA). This EUA will remain  in effect (  meaning this test can be used) for the duration of the COVID-19 declaration under Section 564(b)(1) of the Act, 21 U.S.C.section 360bbb-3(b)(1), unless the authorization is terminated  or revoked sooner.       Influenza A by PCR NEGATIVE NEGATIVE Final   Influenza B by PCR NEGATIVE NEGATIVE Final    Comment: (NOTE) The Xpert Xpress SARS-CoV-2/FLU/RSV plus assay is intended as an aid in the diagnosis of influenza from Nasopharyngeal swab specimens and should not be used as a sole basis for treatment. Nasal washings and aspirates are unacceptable for Xpert Xpress SARS-CoV-2/FLU/RSV testing.  Fact Sheet for Patients: EntrepreneurPulse.com.au  Fact Sheet for Healthcare Providers: IncredibleEmployment.be  This test is not yet approved or cleared by the [place] FDA and has been authorized for detection and/or diagnosis of SARS-CoV-2 by FDA under an Emergency Use Authorization (EUA). This EUA will remain in effect (meaning this test can be used) for the duration of the COVID-19 declaration under Section 564(b)(1) of the Act, 21 U.S.C. section 360bbb-3(b)(1), unless the authorization is terminated or revoked.  Performed at [facility], [address] [place], [place] [address]      Labs: BNP (last 3 results) Recent Labs    12/23/19 1851  BNP 614.4*   Basic Metabolic Panel: Recent Labs  Lab 12/23/19 1242 12/24/19 0637 12/25/19 0505  NA 139 137 136  K 4.1 3.9 3.9  CL 110 107 107  CO2 20* 21* 23  GLUCOSE 113* 96 95  BUN 19 21 20   CREATININE 1.27* 1.35* 1.24  CALCIUM 8.9 9.0 8.7*   Liver Function Tests: Recent Labs  Lab 12/23/19 1242  AST 28  ALT 14  ALKPHOS 53  BILITOT 3.0*  PROT 5.3*  ALBUMIN 2.8*   No results for input(s): LIPASE, AMYLASE in the last 168 hours. No results for input(s): AMMONIA in the last 168 hours. CBC: Recent Labs   Lab 12/23/19 1102 12/24/19 0637 12/25/19 0505  WBC 5.9 5.7 5.8  HGB 13.9 13.4 12.4*  HCT 37.9* 36.1* 33.9*  MCV 98.7 98.1 98.8  PLT 83* 79* 71*   Cardiac Enzymes: No results for input(s): CKTOTAL, CKMB, CKMBINDEX, TROPONINI in the last 168 hours. BNP: Invalid input(s): POCBNP CBG: Recent Labs  Lab 12/24/19 1055 12/24/19 1737 12/24/19 2109 12/25/19 0801 12/25/19 1232  GLUCAP 99 118* 134* 92 123*   D-Dimer No results for input(s): DDIMER in the last 72 hours. Hgb A1c Recent Labs    12/23/19 1537  HGBA1C 5.0   Lipid Profile No results for input(s): CHOL, HDL, LDLCALC, TRIG, CHOLHDL, LDLDIRECT in the last 72 hours. Thyroid function studies Recent Labs    12/23/19 1537  TSH 3.053   Anemia work up No results for input(s): VITAMINB12, FOLATE, FERRITIN, TIBC, IRON, RETICCTPCT in the last 72 hours. Urinalysis    Component Value Date/Time   APPEARANCEUR Clear 01/20/2019 1106   GLUCOSEU 3+ (A) 01/20/2019 1106   BILIRUBINUR Negative 01/20/2019 1106   PROTEINUR Negative 01/20/2019 1106   NITRITE Negative 01/20/2019 1106   LEUKOCYTESUR Negative 01/20/2019 1106   Sepsis Labs Invalid input(s): PROCALCITONIN,  WBC,  LACTICIDVEN Microbiology Recent Results (from the past 240 hour(s))  Resp Panel by RT-PCR (Flu A&B, Covid) Nasopharyngeal Swab     Status: None   Collection Time: 12/23/19 12:08 PM   Specimen: Nasopharyngeal Swab; Nasopharyngeal(NP) swabs in vial transport medium  Result Value Ref Range Status   SARS Coronavirus 2 by RT PCR NEGATIVE NEGATIVE Final    Comment: (NOTE) SARS-CoV-2 target nucleic  acids are NOT DETECTED.  The SARS-CoV-2 RNA is generally detectable in upper respiratory specimens during the acute phase of infection. The lowest concentration of SARS-CoV-2 viral copies this assay can detect is 138 copies/mL. A negative result does not preclude SARS-Cov-2 infection and should not be used as the sole basis for treatment or other patient  management decisions. A negative result may occur with  improper specimen collection/handling, submission of specimen other than nasopharyngeal swab, presence of viral mutation(s) within the areas targeted by this assay, and inadequate number of viral copies(<138 copies/mL). A negative result must be combined with clinical observations, patient history, and epidemiological information. The expected result is Negative.  Fact Sheet for Patients:  EntrepreneurPulse.com.au  Fact Sheet for Healthcare Providers:  IncredibleEmployment.be  This test is no t yet approved or cleared by the [place] FDA and  has been authorized for detection and/or diagnosis of SARS-CoV-2 by FDA under an Emergency Use Authorization (EUA). This EUA will remain  in effect (meaning this test can be used) for the duration of the COVID-19 declaration under Section 564(b)(1) of the Act, 21 U.S.C.section 360bbb-3(b)(1), unless the authorization is terminated  or revoked sooner.       Influenza A by PCR NEGATIVE NEGATIVE Final   Influenza B by PCR NEGATIVE NEGATIVE Final    Comment: (NOTE) The Xpert Xpress SARS-CoV-2/FLU/RSV plus assay is intended as an aid in the diagnosis of influenza from Nasopharyngeal swab specimens and should not be used as a sole basis for treatment. Nasal washings and aspirates are unacceptable for Xpert Xpress SARS-CoV-2/FLU/RSV testing.  Fact Sheet for Patients: EntrepreneurPulse.com.au  Fact Sheet for Healthcare Providers: IncredibleEmployment.be  This test is not yet approved or cleared by the [place] FDA and has been authorized for detection and/or diagnosis of SARS-CoV-2 by FDA under an Emergency Use Authorization (EUA). This EUA will remain in effect (meaning this test can be used) for the duration of the COVID-19 declaration under Section 564(b)(1) of the Act, 21 U.S.C. section 360bbb-3(b)(1),  unless the authorization is terminated or revoked.  Performed at [facility], [address], [place], [place] [address]      Time coordinating discharge: Over 30 minutes  SIGNED:   Wyvonnia Dusky, MD  [facility] 12/25/2019, 1:44 PM Pager   If 7PM-7AM, please contact night-coverage

## 2019-12-25 NOTE — Progress Notes (Signed)
Patient Name: Dean Lynch Date of Encounter: 12/25/2019  Hospital Problem List     Principal Problem:   Bradycardia Active Problems:   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 tophus   Diabetes mellitus type 2, uncomplicated (HCC)   Essential hypertension   Thrombocytopenia (HCC)   Acute CHF (congestive heart failure) ([place])    Patient Profile     69 y.o.malewith history ofdiabetes, hyperlipidemia, hypertension who presented to emergency room with complaints of dizziness fatigue and occasional chest pain. He is a very difficult historian and his wife who is with him is also somewhat unclear regarding his medications however per chart he is on carvedilol 3.125 to 6.25 mg twice daily. Also is on enteric-coated aspirin. He is also on lisinopril hydrochlorothiazide for his blood pressure. In the emergency room he was noted to be bradycardic. Electrocardiogram showed second-degree heart block with 2-1 conduction a ventricular rate of 37. There have been no pauses. He is hemodynamically stable. His wife and he states he has been feeling somewhat sluggish for several days to weeks. Review of the chart did does not know bradycardia and any of his outside visits recently however. Troponin was unremarkable. Electrolytes show a creatinine of 1.27 improved from 1.442 weeks ago. Potassium is normal. Sed rate is 5. He denies syncope but has had dizziness. Had ppm placed yesterday .  Subjective   Feels well anxious to go hoome. Dressing removed from pacer site. Mild swelling and blood under steristrips.   Inpatient Medications    . amLODipine  5 mg Oral Daily  . aspirin EC  81 mg Oral Daily  . Chlorhexidine Gluconate Cloth  6 each Topical Daily  . cholecalciferol  2,000 Units Oral Daily  . colchicine  0.6 mg Oral Daily  . dutasteride  0.5 mg Oral q morning - 10a  . febuxostat  40 mg Oral Daily  . furosemide  40 mg Intravenous Daily  . insulin aspart  0-15 Units Subcutaneous TID WC  .  insulin aspart  0-5 Units Subcutaneous QHS  . lisinopril  20 mg Oral Daily  . omega-3 acid ethyl esters  1 g Oral Daily  . pravastatin  10 mg Oral q1800  . sodium chloride flush  3 mL Intravenous Q12H  . sodium chloride flush  3 mL Intravenous Q12H    Vital Signs    Vitals:   12/24/19 1735 12/24/19 1931 12/25/19 0328 12/25/19 0751  BP: (!) 161/78 (!) 158/74 (!) 169/87 (!) 167/82  Pulse: 82 78 76 76  Resp:  18 18 14   Temp: 98.7 F (37.1 C) 98.7 F (37.1 C) 97.9 F (36.6 C) 98.2 F (36.8 C)  TempSrc: Oral Oral Oral Oral  SpO2: 94% 96% 94% 92%  Weight:   101.2 kg   Height:        Intake/Output Summary (Last 24 hours) at 12/25/2019 0912 Last data filed at 12/25/2019 0836 Gross per 24 hour  Intake 243 ml  Output 1250 ml  Net -1007 ml   Filed Weights   12/24/19 0742 12/24/19 1042 12/25/19 0328  Weight: 98.4 kg 101.2 kg 101.2 kg    Physical Exam    GEN: Well nourished, well developed, in no acute distress.  HEENT: normal.  Neck: Supple, no JVD, carotid bruits, or masses. Cardiac: RRR, no murmurs, rubs, or gallops. No clubbing, cyanosis, edema.  Radials/DP/PT 2+ and equal bilaterally.  Respiratory:  Respirations regular and unlabored, clear to auscultation bilaterally. GI: Soft, nontender, nondistended, BS + x 4. MS: no deformity or  atrophy. Skin: warm and dry, no rash. Neuro:  Strength and sensation are intact. Psych: Normal affect.  Labs    CBC Recent Labs    12/24/19 0637 12/25/19 0505  WBC 5.7 5.8  HGB 13.4 12.4*  HCT 36.1* 33.9*  MCV 98.1 98.8  PLT 79* 71*   Basic Metabolic Panel Recent Labs    12/24/19 0637 12/25/19 0505  NA 137 136  K 3.9 3.9  CL 107 107  CO2 21* 23  GLUCOSE 96 95  BUN 21 20  CREATININE 1.35* 1.24  CALCIUM 9.0 8.7*   Liver Function Tests Recent Labs    12/23/19 1242  AST 28  ALT 14  ALKPHOS 53  BILITOT 3.0*  PROT 5.3*  ALBUMIN 2.8*   No results for input(s): LIPASE, AMYLASE in the last 72 hours. Cardiac  Enzymes No results for input(s): CKTOTAL, CKMB, CKMBINDEX, TROPONINI in the last 72 hours. BNP Recent Labs    12/23/19 1851  BNP 220.7*   D-Dimer No results for input(s): DDIMER in the last 72 hours. Hemoglobin A1C Recent Labs    12/23/19 1537  HGBA1C 5.0   Fasting Lipid Panel No results for input(s): CHOL, HDL, LDLCALC, TRIG, CHOLHDL, LDLDIRECT in the last 72 hours. Thyroid Function Tests Recent Labs    12/23/19 1537  TSH 3.053    Telemetry       ECG    Second degree heart block  Radiology    DG Chest 2 View  Result Date: 12/23/2019 CLINICAL DATA:  Chest pain shortness of breath EXAM: CHEST - 2 VIEW COMPARISON:  None. FINDINGS: Moderate left and small right pleural effusions. Overlying left basilar opacities. No visible pneumothorax. Cardiomediastinal silhouette is within normal limits. Aortic atherosclerosis. Polyarticular degenerative change. No acute osseous abnormality. IMPRESSION: Moderate left and small right pleural effusions. Overlying left basilar opacities may represent atelectasis or pneumonia. Electronically Signed   By: Margaretha Sheffield MD   On: 12/23/2019 10:51   EP PPM/ICD IMPLANT  Result Date: 12/24/2019 [place] [id number]  @CSN @ Preop Dx: AV block second degree Postop Dx CHB Procedure: dual chamber pacemaker insertion After routine prep and drape, lidocaine was infiltrated in the prepectoral subclavicular region on the left side an incision was made and carried down to layer of  the prepectoral fascia using electrocautery and sharp dissection;  a pocket was formed similarly. Hemostasis was obtained. After this, we turned our attention to gaining access to the extrathoracic,left subclavian vein. This was accomplished without difficulty and without the aspiration of air or puncture of the artery. 2 separate venipunctures were accomplished; guidewires were placed and retained and sequentially 7 French sheaths  were placed through which were passed a   MDT 5076 ventricular lead,  and a   MDT 5076  atrial lead, . The ventricular lead was manipulated to the right ventricular apex where the bipolar R wave was 7.55mv, the pacing impedance was 1056,  the threshold was  0.9 V  @ 0.4 msec The right atrial lead was manipulated to the right atrial appendage where the bipolar P-wave was 46mv, the pacing impedance was 865, the threshold was 0.8 V @ 0.4 msec.  The leads were affixed to the prepectoral fascia and attached to a  MDT pulse generator. Hemostasis was obtained. The pocket was copiously irrigated with antibiotic containing saline solution. The leads and the pulse generator were placed in the pocket and affixed to the prepectoral fascia. The wound iwas then closed in 3 layers in normal fashion. The  wound was washed dried and steri strips applied.  Needle count, sponge count  and instrument counts were correct at the end of the procedure. The patient tolerated the procedure without apparent complication. EBL  <50cc's   ECHOCARDIOGRAM COMPLETE  Result Date: 12/24/2019    ECHOCARDIOGRAM REPORT   Patient Name:   Dean Lynch Date of Exam: 12/24/2019 Medical Rec #:  [id number]         Height:       70.0 in Accession #:    [id number]        Weight:       223.2 lb Date of Birth:  Dec 19, 1950         BSA:          2.187 m Patient Age:    67 years          BP:           156/76 mmHg Patient Gender: M                 HR:           71 bpm. Exam Location:  [facility] Procedure: 2D Echo, Cardiac Doppler and Color Doppler Indications:     CHF-acute diastolic 440.10  History:         Patient has no prior history of Echocardiogram examinations.                  Risk Factors:Hypertension and Diabetes.  Sonographer:     Sherrie Sport RDCS (AE) Referring Phys:  [id number] Royce Macadamia AGBATA Diagnosing Phys: Bartholome Bill MD IMPRESSIONS  1. Left ventricular ejection fraction, by estimation, is 65 to 70%. The left ventricle has normal function. The left ventricle has no regional wall motion  abnormalities. There is moderate left ventricular hypertrophy. Left ventricular diastolic parameters are consistent with Grade I diastolic dysfunction (impaired relaxation).  2. Right ventricular systolic function is normal. The right ventricular size is normal.  3. The mitral valve is grossly normal. Trivial mitral valve regurgitation.  4. The aortic valve was not well visualized. Aortic valve regurgitation is not visualized. FINDINGS  Left Ventricle: Left ventricular ejection fraction, by estimation, is 65 to 70%. The left ventricle has normal function. The left ventricle has no regional wall motion abnormalities. The left ventricular internal cavity size was normal in size. There is  moderate left ventricular hypertrophy. Left ventricular diastolic parameters are consistent with Grade I diastolic dysfunction (impaired relaxation). Right Ventricle: The right ventricular size is normal. No increase in right ventricular wall thickness. Right ventricular systolic function is normal. Left Atrium: Left atrial size was normal in size. Right Atrium: Right atrial size was normal in size. Pericardium: There is no evidence of pericardial effusion. Mitral Valve: The mitral valve is grossly normal. Trivial mitral valve regurgitation. Tricuspid Valve: The tricuspid valve is not well visualized. Tricuspid valve regurgitation is trivial. Aortic Valve: The aortic valve was not well visualized. Aortic valve regurgitation is not visualized. Aortic valve mean gradient measures 4.0 mmHg. Aortic valve peak gradient measures 8.0 mmHg. Aortic valve area, by VTI measures 2.15 cm. Pulmonic Valve: The pulmonic valve was not well visualized. Pulmonic valve regurgitation is trivial. Aorta: The aortic root is normal in size and structure. IAS/Shunts: The atrial septum is grossly normal. Additional Comments: A pacer wire is visualized.  LEFT VENTRICLE PLAX 2D LVIDd:         4.63 cm  Diastology LVIDs:         2.75 cm  LV e'  medial:    4.57  cm/s LV PW:         1.28 cm  LV E/e' medial:  20.6 LV IVS:        1.05 cm  LV e' lateral:   4.79 cm/s LVOT diam:     2.00 cm  LV E/e' lateral: 19.7 LV SV:         52 LV SV Index:   24 LVOT Area:     3.14 cm  RIGHT VENTRICLE RV S prime:     17.60 cm/s TAPSE (M-mode): 5.7 cm LEFT ATRIUM             Index       RIGHT ATRIUM           Index LA diam:        2.70 cm 1.23 cm/m  RA Area:     19.80 cm LA Vol (A2C):   67.6 ml 30.91 ml/m RA Volume:   51.90 ml  23.73 ml/m LA Vol (A4C):   42.7 ml 19.52 ml/m LA Biplane Vol: 52.1 ml 23.82 ml/m  AORTIC VALVE                   PULMONIC VALVE AV Area (Vmax):    2.20 cm    PV Vmax:        0.86 m/s AV Area (Vmean):   2.03 cm    PV Peak grad:   2.9 mmHg AV Area (VTI):     2.15 cm    RVOT Peak grad: 5 mmHg AV Vmax:           141.00 cm/s AV Vmean:          94.700 cm/s AV VTI:            0.242 m AV Peak Grad:      8.0 mmHg AV Mean Grad:      4.0 mmHg LVOT Vmax:         98.60 cm/s LVOT Vmean:        61.200 cm/s LVOT VTI:          0.166 m LVOT/AV VTI ratio: 0.69  AORTA Ao Root diam: 3.30 cm MITRAL VALVE                TRICUSPID VALVE MV Area (PHT): 4.06 cm     TR Peak grad:   12.4 mmHg MV Decel Time: 187 msec     TR Vmax:        176.00 cm/s MV E velocity: 94.30 cm/s MV A velocity: 132.00 cm/s  SHUNTS MV E/A ratio:  0.71         Systemic VTI:  0.17 m                             Systemic Diam: 2.00 cm Bartholome Bill MD Electronically signed by Bartholome Bill MD Signature Date/Time: 12/24/2019/3:13:41 PM    Final     Assessment & Plan     69 year old male with history of hypertension who presented to the emergency room with complaints of dizziness nausea and occasional chest pain. EKG showed second-degree heart block with 2-1 conduction ventricular rate of approximately 35-38. Hemodynamically stable. Etiology of this is unclear. Patient most likely is on at least 3.125 if not 6.25 of carvedilol twice daily. He is unaware if he is taking this or not but his outpatient medical  lists included. No significant electrolyte abnormalities noted. No  ischemic changes on electrocardiogram and negative high-sensitivity troponin.  1. Bradycardia-secondary to second-degree heart block. Remains bradycardic. Continued high grade heart block. He had a  dual chamber ppm per Dr. Mylinda Latina yesterday. Was pacing well post procedure.  Will  plan discharge this  Am on lisinopril 20 mg, hctz 12.5 mg daily, carvedilol 6.25 mig bid, amlodipine 5 mg daily,  . pravastain 10 mg daily. Asa 81 mg dialy. Will see next week in follow up.   2. Hypertension-WIll add carvedilol and continue as per above.   3. Diabetes-continue with oral agents.  Signed, Javier Docker Wahneta Derocher MD 12/25/2019, 9:12 AM  Pager: [phone]

## 2019-12-25 NOTE — Progress Notes (Signed)
Patient discharged at this time via ambulatory route to home with all belongings accompanied with wife. Patient verbalized understanding of discharge instructions. PIV/Tele removed by writer and pacemaker placement site redressed as well. NAD noted and VSS upon departing.

## 2019-12-27 ENCOUNTER — Encounter: Payer: Self-pay | Admitting: Cardiology

## 2020-01-03 DIAGNOSIS — Z950 Presence of cardiac pacemaker: Secondary | ICD-10-CM | POA: Diagnosis not present

## 2020-01-03 DIAGNOSIS — N183 Chronic kidney disease, stage 3 unspecified: Secondary | ICD-10-CM | POA: Diagnosis not present

## 2020-01-03 DIAGNOSIS — I10 Essential (primary) hypertension: Secondary | ICD-10-CM | POA: Diagnosis not present

## 2020-01-03 DIAGNOSIS — E119 Type 2 diabetes mellitus without complications: Secondary | ICD-10-CM | POA: Diagnosis not present

## 2020-01-03 DIAGNOSIS — E7800 Pure hypercholesterolemia, unspecified: Secondary | ICD-10-CM | POA: Diagnosis not present

## 2020-01-03 DIAGNOSIS — I441 Atrioventricular block, second degree: Secondary | ICD-10-CM | POA: Diagnosis not present

## 2020-01-20 ENCOUNTER — Ambulatory Visit: Payer: Self-pay | Admitting: Urology

## 2020-01-31 DIAGNOSIS — M25512 Pain in left shoulder: Secondary | ICD-10-CM | POA: Diagnosis not present

## 2020-01-31 DIAGNOSIS — E7800 Pure hypercholesterolemia, unspecified: Secondary | ICD-10-CM | POA: Diagnosis not present

## 2020-01-31 DIAGNOSIS — I441 Atrioventricular block, second degree: Secondary | ICD-10-CM | POA: Diagnosis not present

## 2020-01-31 DIAGNOSIS — N183 Chronic kidney disease, stage 3 unspecified: Secondary | ICD-10-CM | POA: Diagnosis not present

## 2020-01-31 DIAGNOSIS — Z950 Presence of cardiac pacemaker: Secondary | ICD-10-CM | POA: Diagnosis not present

## 2020-01-31 DIAGNOSIS — E119 Type 2 diabetes mellitus without complications: Secondary | ICD-10-CM | POA: Diagnosis not present

## 2020-01-31 DIAGNOSIS — I10 Essential (primary) hypertension: Secondary | ICD-10-CM | POA: Diagnosis not present

## 2020-02-02 DIAGNOSIS — M25512 Pain in left shoulder: Secondary | ICD-10-CM | POA: Diagnosis not present

## 2020-02-02 DIAGNOSIS — M7502 Adhesive capsulitis of left shoulder: Secondary | ICD-10-CM | POA: Diagnosis not present

## 2020-02-02 DIAGNOSIS — M19012 Primary osteoarthritis, left shoulder: Secondary | ICD-10-CM | POA: Diagnosis not present

## 2020-02-14 ENCOUNTER — Emergency Department
Admission: EM | Admit: 2020-02-14 | Discharge: 2020-02-16 | Disposition: A | Discharge status: Home / Self Care | Payer: PPO | Attending: Emergency Medicine | Admitting: Emergency Medicine

## 2020-02-14 ENCOUNTER — Emergency Department: Payer: PPO

## 2020-02-14 ENCOUNTER — Encounter: Payer: Self-pay | Admitting: *Deleted

## 2020-02-14 ENCOUNTER — Other Ambulatory Visit: Payer: Self-pay

## 2020-02-14 DIAGNOSIS — R0902 Hypoxemia: Secondary | ICD-10-CM | POA: Diagnosis not present

## 2020-02-14 DIAGNOSIS — Z7982 Long term (current) use of aspirin: Secondary | ICD-10-CM | POA: Diagnosis not present

## 2020-02-14 DIAGNOSIS — I129 Hypertensive chronic kidney disease with stage 1 through stage 4 chronic kidney disease, or unspecified chronic kidney disease: Secondary | ICD-10-CM | POA: Diagnosis not present

## 2020-02-14 DIAGNOSIS — I130 Hypertensive heart and chronic kidney disease with heart failure and stage 1 through stage 4 chronic kidney disease, or unspecified chronic kidney disease: Secondary | ICD-10-CM | POA: Diagnosis not present

## 2020-02-14 DIAGNOSIS — Z20822 Contact with and (suspected) exposure to covid-19: Secondary | ICD-10-CM | POA: Diagnosis not present

## 2020-02-14 DIAGNOSIS — E1122 Type 2 diabetes mellitus with diabetic chronic kidney disease: Secondary | ICD-10-CM | POA: Insufficient documentation

## 2020-02-14 DIAGNOSIS — Z79899 Other long term (current) drug therapy: Secondary | ICD-10-CM | POA: Diagnosis not present

## 2020-02-14 DIAGNOSIS — R404 Transient alteration of awareness: Secondary | ICD-10-CM | POA: Diagnosis not present

## 2020-02-14 DIAGNOSIS — R456 Violent behavior: Secondary | ICD-10-CM | POA: Insufficient documentation

## 2020-02-14 DIAGNOSIS — N189 Chronic kidney disease, unspecified: Secondary | ICD-10-CM | POA: Diagnosis not present

## 2020-02-14 DIAGNOSIS — R451 Restlessness and agitation: Secondary | ICD-10-CM | POA: Insufficient documentation

## 2020-02-14 DIAGNOSIS — E875 Hyperkalemia: Secondary | ICD-10-CM

## 2020-02-14 DIAGNOSIS — I509 Heart failure, unspecified: Secondary | ICD-10-CM | POA: Diagnosis not present

## 2020-02-14 DIAGNOSIS — R4689 Other symptoms and signs involving appearance and behavior: Secondary | ICD-10-CM

## 2020-02-14 DIAGNOSIS — R4182 Altered mental status, unspecified: Secondary | ICD-10-CM | POA: Diagnosis not present

## 2020-02-14 DIAGNOSIS — N179 Acute kidney failure, unspecified: Secondary | ICD-10-CM | POA: Diagnosis not present

## 2020-02-14 DIAGNOSIS — R410 Disorientation, unspecified: Secondary | ICD-10-CM | POA: Diagnosis not present

## 2020-02-14 DIAGNOSIS — I10 Essential (primary) hypertension: Secondary | ICD-10-CM | POA: Diagnosis present

## 2020-02-14 LAB — CBC
HCT: 36.7 % — ABNORMAL LOW (ref 39.0–52.0)
Hemoglobin: 13.4 g/dL (ref 13.0–17.0)
MCH: 36.4 pg — ABNORMAL HIGH (ref 26.0–34.0)
MCHC: 36.5 g/dL — ABNORMAL HIGH (ref 30.0–36.0)
MCV: 99.7 fL (ref 80.0–100.0)
Platelets: 84 10*3/uL — ABNORMAL LOW (ref 150–400)
RBC: 3.68 MIL/uL — ABNORMAL LOW (ref 4.22–5.81)
RDW: 13.1 % (ref 11.5–15.5)
WBC: 9.7 10*3/uL (ref 4.0–10.5)
nRBC: 0 % (ref 0.0–0.2)

## 2020-02-14 LAB — URINALYSIS, COMPLETE (UACMP) WITH MICROSCOPIC
Bilirubin Urine: NEGATIVE
Glucose, UA: NEGATIVE mg/dL
Hgb urine dipstick: NEGATIVE
Ketones, ur: NEGATIVE mg/dL
Leukocytes,Ua: NEGATIVE
Nitrite: NEGATIVE
Protein, ur: NEGATIVE mg/dL
Specific Gravity, Urine: 1.014 (ref 1.005–1.030)
pH: 7 (ref 5.0–8.0)

## 2020-02-14 LAB — COMPREHENSIVE METABOLIC PANEL
ALT: 34 U/L (ref 0–44)
AST: 34 U/L (ref 15–41)
Albumin: 3.6 g/dL (ref 3.5–5.0)
Alkaline Phosphatase: 89 U/L (ref 38–126)
Anion gap: 7 (ref 5–15)
BUN: 44 mg/dL — ABNORMAL HIGH (ref 8–23)
CO2: 25 mmol/L (ref 22–32)
Calcium: 9.8 mg/dL (ref 8.9–10.3)
Chloride: 107 mmol/L (ref 98–111)
Creatinine, Ser: 1.8 mg/dL — ABNORMAL HIGH (ref 0.61–1.24)
GFR, Estimated: 40 mL/min — ABNORMAL LOW (ref >60)
Glucose, Bld: 135 mg/dL — ABNORMAL HIGH (ref 70–99)
Potassium: 5.3 mmol/L — ABNORMAL HIGH (ref 3.5–5.1)
Sodium: 139 mmol/L (ref 135–145)
Total Bilirubin: 2.2 mg/dL — ABNORMAL HIGH (ref 0.3–1.2)
Total Protein: 6.3 g/dL — ABNORMAL LOW (ref 6.5–8.1)

## 2020-02-14 LAB — CBG MONITORING, ED: Glucose-Capillary: 116 mg/dL — ABNORMAL HIGH (ref 70–99)

## 2020-02-14 MED ORDER — SODIUM CHLORIDE 0.9 % IV BOLUS (SEPSIS)
1000.0000 mL | Freq: Once | INTRAVENOUS | Status: AC
  Administered 2020-02-15: 1000 mL via INTRAVENOUS

## 2020-02-14 NOTE — ED Notes (Signed)
Pt to CT at this time.

## 2020-02-14 NOTE — ED Provider Notes (Addendum)
[facility] Emergency Department Provider Note  ____________________________________________   Event Date/Time   First MD Initiated Contact with Patient 02/14/20 2255     (approximate)  I have reviewed the triage vital signs and the nursing notes.   HISTORY  Chief Complaint Altered Mental Status    HPI Dean Lynch is a 70 y.o. 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  Patient unable to provide history and wife is not at bedside.  Patient arrives via EMS.  Per nursing notes, patient was outside tonight naked holding a blanket.  Patient denies having any pain at this time.  When asked why he is here he states "my wife was concerned about my health".  Reports he lives at home with his wife.      Wife - Adonis Huguenin - [phone]  Past Medical History:  Diagnosis Date  . BPH (benign prostatic hyperplasia)   . Diabetes mellitus without complication ([place])   . Gout   . Hypercholesteremia   . Hypertension   . Skin cancer 2017   melanoa- removed at [facility]  . Thrombocytopenia [facility])     Patient Active Problem List   Diagnosis Date Noted  . Bradycardia 12/23/2019  . Acute CHF (congestive heart failure) ([place]) 12/23/2019  . Thrombocytopenia ([place]) 07/18/2016  . Benign prostatic hyperplasia without lower urinary tract symptoms 07/16/2016  . History of melanoma 12/06/2015  . Diabetes mellitus type 2, uncomplicated ([place]) 09/81/1914  . 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 tophus 12/15/2013  . Essential hypertension 12/15/2013  . Hypercholesterolemia 12/15/2013    Past Surgical History:  Procedure Laterality Date  . CATARACT EXTRACTION W/PHACO Right 09/27/2015   Procedure: CATARACT EXTRACTION PHACO AND INTRAOCULAR LENS PLACEMENT (IOC);  Surgeon: Leandrew Koyanagi, MD;   Location: [facility];  Service: Ophthalmology;  Laterality: Right;  DIABETIC  . KNEE ARTHROSCOPY    . PACEMAKER IMPLANT N/A 12/24/2019   Procedure: PACEMAKER IMPLANT;  Surgeon: Marzetta Board, MD;  Location: [facility];  Service: Cardiovascular;  Laterality: N/A;    Prior to Admission medications   Medication Sig Start Date End Date Taking? Authorizing Provider  amLODipine (NORVASC) 5 MG tablet Take 1 tablet (5 mg total) by mouth daily. 12/26/19 01/25/20  Wyvonnia Dusky, MD  aspirin EC 81 MG tablet Take 81 mg by mouth daily.  02/25/18   [provider]  carvedilol (COREG) 6.25 MG tablet Take 1 tablet (6.25 mg total) by mouth 2 (two) times daily with a meal. 12/25/19 01/24/20  Wyvonnia Dusky, MD  Cholecalciferol (VITAMIN D3) 50 MCG (2000 UT) capsule Take 4,000 Units by mouth daily.    [provider]  clobetasol ointment (TEMOVATE) 7.82 % Apply 1 application topically 2 (two) times daily as needed (skin irritations).    [provider]  colchicine 0.6 MG tablet Take 0.5 tablets (0.3 mg total) by mouth every other day. 12/27/19 01/26/20  Wyvonnia Dusky, MD  dutasteride (AVODART) 0.5 MG capsule Take 0.5 mg by mouth every morning.    [provider]  febuxostat (ULORIC) 40 MG tablet Take 40 mg by mouth daily.  02/25/18   [provider]  hydrocortisone 2.5 % ointment Apply 1 application topically daily.  12/06/15   [provider]  ketoconazole (NIZORAL) 2 % cream Apply 1 application topically daily.  12/06/15   [provider]  lisinopril-hydrochlorothiazide (PRINZIDE,ZESTORETIC) 20-12.5 MG tablet Take 1 tablet by mouth every morning.    [provider]  magnesium oxide (MAG-OX) 400 MG tablet Take 400 mg by mouth 2 (two) times daily.    [provider]  Omega-3 Fatty Acids (FISH OIL) 1000 MG CAPS Take 1,000 mg by mouth daily.    [provider]  pravastatin (PRAVACHOL) 10 MG  tablet Take 1 tablet (10 mg total) by mouth daily at 6 PM. 12/25/19 01/24/20  Wyvonnia Dusky, MD  traZODone (DESYREL) 50 MG tablet Take 50 mg by mouth at bedtime. 12/01/19   [provider]    Allergies Fenofibrate micronized  Family History  Problem Relation Age of Onset  . Coronary artery disease Mother   . Polycythemia Mother   . Macular degeneration Mother   . Dementia Mother   . Coronary artery disease Father     Social History Social History   Tobacco Use  . Smoking status: Never Smoker  . Smokeless tobacco: Never Used  Vaping Use  . Vaping Use: Never used  Substance Use Topics  . Alcohol use: No  . Drug use: No    Review of Systems Level 5 caveat secondary to altered mental status  ____________________________________________   PHYSICAL EXAM:  VITAL SIGNS: ED Triage Vitals  Enc Vitals Group     BP 02/14/20 1957 132/73     Pulse Rate 02/14/20 1957 60     Resp 02/14/20 1957 16     Temp 02/14/20 1957 98 F (36.7 C)     Temp Source 02/14/20 1957 Oral     SpO2 02/14/20 1942 99 %     Weight 02/14/20 1958 223 lb 1.7 oz (101.2 kg)     Height 02/14/20 1958 5\' 10"  (1.778 m)     Head Circumference --      Peak Flow --      Pain Score --      Pain Loc --      Pain Edu? --      Excl. in GC? --    CONSTITUTIONAL: Alert and oriented person and place but not time or situation and responds appropriately to questions intermittently. Well-appearing; well-nourished, elderly, afebrile, nontoxic-appearing HEAD: Normocephalic, atraumatic EYES: Conjunctivae clear, pupils appear equal, EOM appear intact ENT: normal nose; moist mucous membranes NECK: Supple, normal ROM CARD: RRR; S1 and S2 appreciated; no murmurs, no clicks, no rubs, no gallops RESP: Normal chest excursion without splinting or tachypnea; breath sounds clear and equal bilaterally; no wheezes, no rhonchi, no rales, no hypoxia or respiratory distress, speaking full sentences ABD/GI: Normal bowel  sounds; non-distended; soft, non-tender, no rebound, no guarding, no peritoneal signs, no hepatosplenomegaly BACK: The back appears normal EXT: Normal ROM in all joints; no deformity noted, no edema; no cyanosis SKIN: Normal color for age and race; warm; no rash on exposed skin NEURO: Moves all extremities equally, no facial asymmetry, normal speech, cranial nerves II through XII intact, appears to have normal sensation diffusely PSYCH: The patient's mood and manner are appropriate.  ____________________________________________   LABS (all labs ordered are listed, but only abnormal results are displayed)  Labs Reviewed  COMPREHENSIVE METABOLIC PANEL - Abnormal; Notable for the following components:      Result Value   Potassium 5.3 (*)    Glucose, Bld 135 (*)    BUN 44 (*)    Creatinine, Ser 1.80 (*)    Total Protein 6.3 (*)    Total Bilirubin 2.2 (*)  GFR, Estimated 40 (*)    All other components within normal limits  CBC - Abnormal; Notable for the following components:   RBC 3.68 (*)    HCT 36.7 (*)    MCH 36.4 (*)    MCHC 36.5 (*)    Platelets 84 (*)    All other components within normal limits  URINALYSIS, COMPLETE (UACMP) WITH MICROSCOPIC - Abnormal; Notable for the following components:   Color, Urine YELLOW (*)    APPearance CLEAR (*)    Bacteria, UA RARE (*)    All other components within normal limits  BASIC METABOLIC PANEL - Abnormal; Notable for the following components:   CO2 20 (*)    Glucose, Bld 105 (*)    BUN 41 (*)    Creatinine, Ser 1.49 (*)    GFR, Estimated 51 (*)    All other components within normal limits  CBG MONITORING, ED - Abnormal; Notable for the following components:   Glucose-Capillary 116 (*)    All other components within normal limits  SARS CORONAVIRUS 2 (TAT 6-24 HRS)   ____________________________________________  EKG   EKG Interpretation  Date/Time:  Monday February 14 2020 23:55:29 EST Ventricular Rate:  65 PR  Interval:  200 QRS Duration: 122 QT Interval:  462 QTC Calculation: 480 R Axis:   -49 Text Interpretation: Atrial-sensed ventricular-paced rhythm Abnormal ECG Confirmed by Rochele RaringWard, Delrose Rohwer [phone]([id number]) on 02/15/2020 12:02:13 AM        EKG Interpretation  Date/Time:  Tuesday February 15 2020 03:33:41 EST Ventricular Rate:  71 PR Interval:  200 QRS Duration: 165 QT Interval:  484 QTC Calculation: 527 R Axis:   -80 Text Interpretation: Normal sinus rhythm Left bundle branch block No significant change since last tracing Artifact Confirmed by Rochele RaringWard, Findley Blankenbaker [phone]([id number]) on 02/15/2020 3:41:46 AM       Date: 02/15/2020 3:48 AM   Rate: 69  Rhythm: AV paced rhythm  QRS Axis: normal  Intervals: normal  ST/T Wave abnormalities: normal  Conduction Disutrbances: none  Narrative Interpretation: AV paced rhythm     ____________________________________________  RADIOLOGY I, Jelitza Manninen, personally viewed and evaluated these images (plain radiographs) as part of my medical decision making, as well as reviewing the written report by the radiologist.  ED MD interpretation: Head CT shows no acute abnormality.  Official radiology report(s): CT Head Wo Contrast  Result Date: 02/14/2020 CLINICAL DATA:  New onset confusion for multiple months EXAM: CT HEAD WITHOUT CONTRAST TECHNIQUE: Contiguous axial images were obtained from the base of the skull through the vertex without intravenous contrast. COMPARISON:  MR imaging 06/27/1999 (report only) FINDINGS: Brain: No evidence of acute infarction, hemorrhage, hydrocephalus, extra-axial collection, visible mass lesion or mass effect. Symmetric prominence of the ventricles, cisterns and sulci compatible with parenchymal volume loss. Patchy areas of white matter hypoattenuation are most compatible with chronic microvascular angiopathy. Vascular: Atherosclerotic calcification of the carotid siphons. No hyperdense vessel. Skull: No calvarial fracture or suspicious osseous  lesion. No scalp swelling or hematoma. Sinuses/Orbits: Paranasal sinuses and mastoid air cells are predominantly clear. Prior right lens extraction. Included orbital contents are otherwise unremarkable. Other: Edentulous with mandibular prognathism. IMPRESSION: 1. No acute intracranial findings. 2. Chronic microvascular angiopathy and parenchymal volume loss. Electronically Signed   By: Kreg ShropshirePrice  DeHay M.D.   On: 02/14/2020 23:41    ____________________________________________   PROCEDURES  Procedure(s) performed (including Critical Care):  None ____________________________________________   INITIAL IMPRESSION / ASSESSMENT AND PLAN / ED COURSE  As part of my medical decision  making, I reviewed the following data within the [place] History obtained from family, Nursing notes reviewed and incorporated, Labs reviewed, EKG interpreted NSR, Old EKG reviewed, CT reviewed, Old chart reviewed and Notes from prior ED visits         Patient here with altered mental status for months per nursing notes.  No focal neurologic deficits on exam at this time.  He is afebrile here and hemodynamically stable.  No signs of traumatic injury on exam.  Labs obtained in triage show mild elevation of creatinine and potassium.  Will obtain EKG.  Will give IV fluids.  Otherwise labs appear to be at his baseline.  Urine does not appear infected.  Will obtain CT of the head.  Differential includes CVA, intracranial hemorrhage, dehydration, infection, metabolic derangement, anemia, dementia.  I have attempted to get in touch with patient's wife by the cell phone number and home number listed in patient's chart.  Have left a message on the home phone number.  Cell phone number goes straight to voicemail.     12:03 AM  Pt's EKG shows a paced rhythm.  CT head shows no acute abnormality.  Still have not heard back from patient's wife regarding her concerns.  12:18 AM  Spoke with wife Adonis Huguenin by phone.  She  reports he has been more irritable, confused, sleeping more than normal since discharge from the hospital in December 2021 when he was admitted to have pacemaker placed.    She states tonight that he has been verbally aggressive towards her.  He threatened to hit her.   He was wandering around outside tonight looking for his blanket that he was holding.  Told her he cut his toe when he didn't and asked for a bandaid that she placed on his toe and then he got upset saying that she had not placed a bandaid.  She states he is gone outside without clothes on.  She found clothes in the living room with urine on them.  She states that he does not know how to dial numbers on the phone.  She reports his mother had a history of dementia.    She states this has gotten progressively worse over two weeks.  She reports he was not confused at all prior to his admission.  She tried calling the PCP and they could not see him until April.  They have a [facility] neurology appointment on 2/28.  She reports symptoms worsen at night.  We will discuss with hospitalist on call to see if patient meets criteria for hospital admission given this seems to be more of an acute, subacute change in mental status.  If he does not meet criteria for hospitalization, will consult TTS and psychiatry for possible [facility] psych placement.  1:35 AM  D/w Dr. Damita Dunnings with hospitalist service.  She has reviewed patient's labs, imaging.  She does not feel patient meets criteria for medical admission and I feel this also is very reasonable.  She recommends rechecking BMP after IV fluids.  She agrees that patient needs psychiatric evaluation and possible [facility] psych placement.  TTS, psychiatry consult placed.  3:14 AM  Pt's repeat BMP shows potassium has improved to 5.0 and creatinine is down to 1.49 which is closer to patient's baseline.  At this time patient is medically cleared.  I have talked to TTS who is evaluated patient.  He will be evaluated by  psychiatry in the morning.  Will place patient under full IVC.  I reviewed all nursing notes and pertinent previous records as available.  I have reviewed and interpreted any EKGs, lab and urine results, imaging (as available).   3:30 AM  Nurse noticed a change in patient's rhythm on the cardiac monitor.  He now appears to be in a left bundle branch block but a sinus rhythm with a rate in the 70s.  Does not look like an AV block.  Does not meet [place] criteria.  On review of his previous EKGs, it appears that this is new for patient to be in a left bundle.  Continues to be hemodynamically stable and has no complaints.  Denies chest pain or shortness of breath.  Heart and lung sounds within normal limits.  Does not look volume overloaded.  Will interrogate his [employer] pacemaker, cycle his troponins and check a magnesium level.  We will continue to keep him on the cardiac monitor at this time.  3:52 AM Patient converted back into a paced rhythm.  No acute events noted on interrogation of his pacemaker.  4:55 AM  Pt's first troponin normal at 6.  Magnesium 1.9.  Second troponin pending.  7:01 AM  Pt's repeat troponin is 9. Patient is medically clear. Awaiting psychiatric evaluation. ____________________________________________   FINAL CLINICAL IMPRESSION(S) / ED DIAGNOSES  Final diagnoses:  Altered mental status, unspecified altered mental status type  Aggressive behavior  Acute kidney injury superimposed on chronic kidney disease ([place])  Hyperkalemia     ED Discharge Orders    None      *Please note:  JEROD MCQUAIN was evaluated in Emergency Department on 02/15/2020 for the symptoms described in the history of present illness. He was evaluated in the context of the global COVID-19 pandemic, which necessitated consideration that the patient might be at risk for infection with the SARS-CoV-2 virus that causes COVID-19. Institutional protocols and algorithms that pertain to the  evaluation of patients at risk for COVID-19 are in a state of rapid change based on information released by regulatory bodies including the CDC and federal and state organizations. These policies and algorithms were followed during the patient's care in the ED.  Some ED evaluations and interventions may be delayed as a result of limited staffing during and the pandemic.*   Note:  This document was prepared using Dragon voice recognition software and may include unintentional dictation errors.   Toniann Dickerson, Delice Bison, DO 02/15/20 0314    Yandiel Bergum, Delice Bison, DO 02/15/20 [id number]

## 2020-02-14 NOTE — ED Triage Notes (Signed)
EMS brings pt in from home; wife reports new onset confusion "multiple months"; went outside tonight and stripped naked on porch, holding blanket

## 2020-02-14 NOTE — ED Triage Notes (Signed)
Pt to ED for AMS for the past few months as documented in previous note. In triage pt is alert and oriented to self and place but disoriented to time and situation. Pt is calm and cooperative. Denies SOB, CP, dizziness, weakness, lightheadedness or pain. Pt reports he feels "shaky" from time to time.

## 2020-02-15 DIAGNOSIS — N179 Acute kidney failure, unspecified: Secondary | ICD-10-CM | POA: Diagnosis not present

## 2020-02-15 DIAGNOSIS — I129 Hypertensive chronic kidney disease with stage 1 through stage 4 chronic kidney disease, or unspecified chronic kidney disease: Secondary | ICD-10-CM | POA: Diagnosis not present

## 2020-02-15 DIAGNOSIS — R451 Restlessness and agitation: Secondary | ICD-10-CM | POA: Diagnosis not present

## 2020-02-15 DIAGNOSIS — N189 Chronic kidney disease, unspecified: Secondary | ICD-10-CM | POA: Diagnosis not present

## 2020-02-15 DIAGNOSIS — E875 Hyperkalemia: Secondary | ICD-10-CM | POA: Diagnosis not present

## 2020-02-15 LAB — BASIC METABOLIC PANEL
Anion gap: 7 (ref 5–15)
BUN: 41 mg/dL — ABNORMAL HIGH (ref 8–23)
CO2: 20 mmol/L — ABNORMAL LOW (ref 22–32)
Calcium: 9 mg/dL (ref 8.9–10.3)
Chloride: 111 mmol/L (ref 98–111)
Creatinine, Ser: 1.49 mg/dL — ABNORMAL HIGH (ref 0.61–1.24)
GFR, Estimated: 51 mL/min — ABNORMAL LOW (ref >60)
Glucose, Bld: 105 mg/dL — ABNORMAL HIGH (ref 70–99)
Potassium: 5 mmol/L (ref 3.5–5.1)
Sodium: 138 mmol/L (ref 135–145)

## 2020-02-15 LAB — TROPONIN I (HIGH SENSITIVITY)
Troponin I (High Sensitivity): 6 ng/L (ref <18)
Troponin I (High Sensitivity): 9 ng/L (ref <18)

## 2020-02-15 LAB — MAGNESIUM: Magnesium: 1.9 mg/dL (ref 1.7–2.4)

## 2020-02-15 LAB — SARS CORONAVIRUS 2 (TAT 6-24 HRS): SARS Coronavirus 2: NEGATIVE

## 2020-02-15 MED ORDER — LISINOPRIL-HYDROCHLOROTHIAZIDE 20-12.5 MG PO TABS: 1.0000 | ORAL_TABLET | Freq: Every morning | ORAL | Status: DC

## 2020-02-15 MED ORDER — LISINOPRIL 10 MG PO TABS
20.0000 mg | ORAL_TABLET | Freq: Every day | ORAL | Status: DC
  Administered 2020-02-16: 20 mg via ORAL
  Filled 2020-02-15 (×2): qty 2

## 2020-02-15 MED ORDER — ASPIRIN EC 81 MG PO TBEC
81.0000 mg | DELAYED_RELEASE_TABLET | Freq: Every day | ORAL | Status: DC
  Administered 2020-02-15 – 2020-02-16 (×2): 81 mg via ORAL
  Filled 2020-02-15 (×2): qty 1

## 2020-02-15 MED ORDER — VITAMIN D3 25 MCG (1000 UNIT) PO TABS
4000.0000 [IU] | ORAL_TABLET | Freq: Every day | ORAL | Status: DC
  Administered 2020-02-15 – 2020-02-16 (×2): 4000 [IU] via ORAL
  Filled 2020-02-15 (×4): qty 4

## 2020-02-15 MED ORDER — AMLODIPINE BESYLATE 5 MG PO TABS
5.0000 mg | ORAL_TABLET | Freq: Every day | ORAL | Status: DC
  Administered 2020-02-15 – 2020-02-16 (×2): 5 mg via ORAL
  Filled 2020-02-15 (×2): qty 1

## 2020-02-15 MED ORDER — HYDROCHLOROTHIAZIDE 12.5 MG PO CAPS
12.5000 mg | ORAL_CAPSULE | Freq: Every day | ORAL | Status: DC
  Administered 2020-02-15: 12.5 mg via ORAL
  Filled 2020-02-15 (×2): qty 1

## 2020-02-15 MED ORDER — COLCHICINE 0.6 MG PO TABS
0.3000 mg | ORAL_TABLET | ORAL | Status: DC
  Administered 2020-02-15: 0.3 mg via ORAL
  Filled 2020-02-15: qty 0.5

## 2020-02-15 MED ORDER — CARVEDILOL 6.25 MG PO TABS
6.2500 mg | ORAL_TABLET | Freq: Two times a day (BID) | ORAL | Status: DC
  Administered 2020-02-15 – 2020-02-16 (×3): 6.25 mg via ORAL
  Filled 2020-02-15 (×3): qty 1

## 2020-02-15 MED ORDER — TRAZODONE HCL 50 MG PO TABS
50.0000 mg | ORAL_TABLET | Freq: Every day | ORAL | Status: DC
  Administered 2020-02-16: 50 mg via ORAL
  Filled 2020-02-15: qty 1

## 2020-02-15 MED ORDER — FEBUXOSTAT 40 MG PO TABS
40.0000 mg | ORAL_TABLET | Freq: Every day | ORAL | Status: DC
  Administered 2020-02-15: 40 mg via ORAL
  Filled 2020-02-15 (×2): qty 1

## 2020-02-15 MED ORDER — PRAVASTATIN SODIUM 10 MG PO TABS
10.0000 mg | ORAL_TABLET | Freq: Every day | ORAL | Status: DC
  Administered 2020-02-15: 10 mg via ORAL
  Filled 2020-02-15 (×2): qty 1

## 2020-02-15 MED ORDER — DUTASTERIDE 0.5 MG PO CAPS
0.5000 mg | ORAL_CAPSULE | Freq: Every morning | ORAL | Status: DC
  Administered 2020-02-15: 0.5 mg via ORAL
  Filled 2020-02-15 (×2): qty 1
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
## 2020-02-15 NOTE — Consult Note (Signed)
[place] Psychiatry Consult   Reason for Consult: Consult for 70 year old man brought into the hospital by family out of concern for recent agitation Referring Physician: Shelbie Ammons Patient Identification: TERRIN MEDDAUGH MRN:  [id number] Principal Diagnosis: Agitation Diagnosis:  Principal Problem:   Agitation Active Problems:   Essential hypertension   Total Time spent with patient: 1 hour  Subjective:   WILBORN MEMBRENO is a 70 y.o. male patient admitted with "I get agitated".  HPI: Patient seen chart reviewed.  70 year old man brought in by his wife with concerns that he has become increasingly agitated.  Reports that he has been aggressive towards her at home and made threatening statements recently.  Also that he was running around in a state of partial undress outdoors confused at times.  She apparently has spoken to the primary care doctor about this.  Indication in the chart is that this is a relatively new phenomenon.  The patient states that he is here in the hospital because "I get agitated".  He says he will get agitated and irritable at times and this can go on for a week or more.  He says it had been going on recently but that he does not feel agitated at the moment.  He has a hard time exactly describing what he is feeling during that time.  He denies auditory hallucinations but admits that sometimes he will see things.  However he cannot describe exactly what he sees either, saying sometimes it will be something like a car.  He denies having any suicidal thoughts.  Denies any homicidal thoughts or wish to harm anyone particularly his wife.  Patient denies any alcohol or drug use.  Denies any recent changes that he is aware of to his medication.  He does have some awareness that he has had some forgetfulness.  He says his sleep is not very good but his appetite is fine.  I administered a [place] cognitive assessment to the patient.  He was cooperative and appeared to be making  an effort but scored only 8 out of 30 which is consistent with pretty severe dementia.  I note however that recent notes about the patient do not necessarily describe him as being severely demented suggesting that this may be a recent worsening of his condition.  I would also mention that the patient told me that he had a tremor in his hand that would interfere with his drawing.  He did have a tremor although that was not by any means the reason that he could not complete the drawings.  However, I noted that when holding the clipboard his hands at times became very shaky.  On a couple of occasions he almost look like he was going to hit himself in the head accidentally with the clipboard.  Past Psychiatric History: No previous psychiatric evaluation that I can identify.  No known history of mood disorder or psychosis or substance abuse problems  Risk to Self:   Risk to Others:   Prior Inpatient Therapy:   Prior Outpatient Therapy:    Past Medical History:  Past Medical History:  Diagnosis Date  . BPH (benign prostatic hyperplasia)   . Diabetes mellitus without complication ([place])   . Gout   . Hypercholesteremia   . Hypertension   . Skin cancer 2017   melanoa- [facility] at [facility]  . Thrombocytopenia ([place])     Past Surgical History:  Procedure Laterality Date  . CATARACT EXTRACTION W/PHACO Right 09/27/2015   Procedure: CATARACT  EXTRACTION PHACO AND INTRAOCULAR LENS PLACEMENT (IOC);  Surgeon: Leandrew Koyanagi, MD;  Location: [facility];  Service: Ophthalmology;  Laterality: Right;  DIABETIC  . KNEE ARTHROSCOPY    . PACEMAKER IMPLANT N/A 12/24/2019   Procedure: PACEMAKER IMPLANT;  Surgeon: Marzetta Board, MD;  Location: [facility];  Service: Cardiovascular;  Laterality: N/A;   Family History:  Family History  Problem Relation Age of Onset  . Coronary artery disease Mother   . Polycythemia Mother   . Macular degeneration Mother   . Dementia Mother   . Coronary artery  disease Father    Family Psychiatric  History: He says he does not know [place] Social History:  Social History   Substance and Sexual Activity  Alcohol Use No     Social History   Substance and Sexual Activity  Drug Use No    Social History   Socioeconomic History  . Marital status: Married    Spouse name: Not on file  . Number of children: Not on file  . Years of education: Not on file  . Highest education level: Not on file  Occupational History  . Not on file  Tobacco Use  . Smoking status: Never Smoker  . Smokeless tobacco: Never Used  Vaping Use  . Vaping Use: Never used  Substance and Sexual Activity  . Alcohol use: No  . Drug use: No  . Sexual activity: Yes  Other Topics Concern  . Not on file  Social History Narrative  . Not on file   Social Determinants of Health   Financial Resource Strain: Not on file  Food Insecurity: Not on file  Transportation Needs: Not on file  Physical Activity: Not on file  Stress: Not on file  Social Connections: Not on file   Additional Social History:    Allergies:   Allergies  Allergen Reactions  . Fenofibrate Micronized Other (See Comments)    Elevated Liver Enzymes    Labs:  Results for orders placed or performed during the hospital encounter of 02/14/20 (from the past 48 hour(s))  CBG monitoring, ED     Status: Abnormal   Collection Time: 02/14/20  8:05 PM  Result Value Ref Range   Glucose-Capillary 116 (H) 70 - 99 mg/dL    Comment: Glucose reference range applies only to samples taken after fasting for at least 8 hours.  Comprehensive metabolic panel     Status: Abnormal   Collection Time: 02/14/20  8:06 PM  Result Value Ref Range   Sodium 139 135 - 145 mmol/L   Potassium 5.3 (H) 3.5 - 5.1 mmol/L   Chloride 107 98 - 111 mmol/L   CO2 25 22 - 32 mmol/L   Glucose, Bld 135 (H) 70 - 99 mg/dL    Comment: Glucose reference range applies only to samples taken after fasting for at least 8 hours.   BUN 44 (H) 8  - 23 mg/dL   Creatinine, Ser 1.80 (H) 0.61 - 1.24 mg/dL   Calcium 9.8 8.9 - 10.3 mg/dL   Total Protein 6.3 (L) 6.5 - 8.1 g/dL   Albumin 3.6 3.5 - 5.0 g/dL   AST 34 15 - 41 U/L   ALT 34 0 - 44 U/L   Alkaline Phosphatase 89 38 - 126 U/L   Total Bilirubin 2.2 (H) 0.3 - 1.2 mg/dL   GFR, Estimated 40 (L) >60 mL/min    Comment: (NOTE) Calculated using the CKD-EPI Creatinine Equation (2021)    Anion gap 7  5 - 15    Comment: Performed at [facility], [address] [place], [place] [address]  CBC     Status: Abnormal   Collection Time: 02/14/20  8:06 PM  Result Value Ref Range   WBC 9.7 4.0 - 10.5 K/uL   RBC 3.68 (L) 4.22 - 5.81 MIL/uL   Hemoglobin 13.4 13.0 - 17.0 g/dL   HCT 36.7 (L) 39.0 - 52.0 %   MCV 99.7 80.0 - 100.0 fL   MCH 36.4 (H) 26.0 - 34.0 pg   MCHC 36.5 (H) 30.0 - 36.0 g/dL   RDW 13.1 11.5 - 15.5 %   Platelets 84 (L) 150 - 400 K/uL    Comment: Immature Platelet Fraction may be clinically indicated, consider ordering this additional test [id number]    nRBC 0.0 0.0 - 0.2 %    Comment: Performed at [facility], [address] [place], [place] [address]  Urinalysis, Complete w Microscopic Urine, Random     Status: Abnormal   Collection Time: 02/14/20  8:06 PM  Result Value Ref Range   Color, Urine YELLOW (A) YELLOW   APPearance CLEAR (A) CLEAR   Specific Gravity, Urine 1.014 1.005 - 1.030   pH 7.0 5.0 - 8.0   Glucose, UA NEGATIVE NEGATIVE mg/dL   Hgb urine dipstick NEGATIVE NEGATIVE   Bilirubin Urine NEGATIVE NEGATIVE   Ketones, ur NEGATIVE NEGATIVE mg/dL   Protein, ur NEGATIVE NEGATIVE mg/dL   Nitrite NEGATIVE NEGATIVE   Leukocytes,Ua NEGATIVE NEGATIVE   WBC, UA 0-5 0 - 5 WBC/hpf   Bacteria, UA RARE (A) NONE SEEN   Squamous Epithelial / LPF 0-5 0 - 5    Comment: Performed at [facility], [address] [place], [place] [address]  SARS CORONAVIRUS 2 (TAT 6-24 HRS) Nasopharyngeal Nasopharyngeal Swab     Status: None   Collection  Time: 02/15/20  1:01 AM   Specimen: Nasopharyngeal Swab  Result Value Ref Range   SARS Coronavirus 2 NEGATIVE NEGATIVE    Comment: (NOTE) SARS-CoV-2 target nucleic acids are NOT DETECTED.  The SARS-CoV-2 RNA is generally detectable in upper and lower respiratory specimens during the acute phase of infection. Negative results do not preclude SARS-CoV-2 infection, do not rule out co-infections with other pathogens, and should not be used as the sole basis for treatment or other patient management decisions. Negative results must be combined with clinical observations, patient history, and epidemiological information. The expected result is Negative.  Fact Sheet for Patients: SugarRoll.be  Fact Sheet for Healthcare Providers: https://www.woods-mathews.com/  This test is not yet approved or cleared by the [place] FDA and  has been authorized for detection and/or diagnosis of SARS-CoV-2 by FDA under an Emergency Use Authorization (EUA). This EUA will remain  in effect (meaning this test can be used) for the duration of the COVID-19 declaration under Se ction 564(b)(1) of the Act, 21 U.S.C. section 360bbb-3(b)(1), unless the authorization is terminated or revoked sooner.  Performed at [facility], [place] [address], [place], [place] [address]   Basic metabolic panel     Status: Abnormal   Collection Time: 02/15/20  2:45 AM  Result Value Ref Range   Sodium 138 135 - 145 mmol/L   Potassium 5.0 3.5 - 5.1 mmol/L   Chloride 111 98 - 111 mmol/L   CO2 20 (L) 22 - 32 mmol/L   Glucose, Bld 105 (H) 70 - 99 mg/dL    Comment: Glucose reference range applies only to samples  taken after fasting for at least 8 hours.   BUN 41 (H) 8 - 23 mg/dL   Creatinine, Ser 1.49 (H) 0.61 - 1.24 mg/dL   Calcium 9.0 8.9 - 10.3 mg/dL   GFR, Estimated 51 (L) >60 mL/min    Comment: (NOTE) Calculated using the CKD-EPI Creatinine Equation (2021)    Anion gap 7  5 - 15    Comment: Performed at [facility], [place], [place] [address]  Troponin I (High Sensitivity)     Status: None   Collection Time: 02/15/20  4:01 AM  Result Value Ref Range   Troponin I (High Sensitivity) 6 <18 ng/L    Comment: (NOTE) Elevated high sensitivity troponin I (hsTnI) values and significant  changes across serial measurements may suggest ACS but many other  chronic and acute conditions are known to elevate hsTnI results.  Refer to the "Links" section for chest pain algorithms and additional  guidance. Performed at [facility], [address] [place], [place] [address]   Magnesium     Status: None   Collection Time: 02/15/20  4:01 AM  Result Value Ref Range   Magnesium 1.9 1.7 - 2.4 mg/dL    Comment: Performed at [facility], [place], [place] [address]  Troponin I (High Sensitivity)     Status: None   Collection Time: 02/15/20  5:57 AM  Result Value Ref Range   Troponin I (High Sensitivity) 9 <18 ng/L    Comment: (NOTE) Elevated high sensitivity troponin I (hsTnI) values and significant  changes across serial measurements may suggest ACS but many other  chronic and acute conditions are known to elevate hsTnI results.  Refer to the "Links" section for chest pain algorithms and additional  guidance. Performed at [facility], [address], [place], [place] [address]     Current Facility-Administered Medications  Medication Dose Route Frequency Provider Last Rate Last Admin  . amLODipine (NORVASC) tablet 5 mg  5 mg Oral Daily Ward, Kristen N, DO   5 mg at 02/15/20 1203  . aspirin EC tablet 81 mg  81 mg Oral Daily Ward, Kristen N, DO   81 mg at 02/15/20 1205  . carvedilol (COREG) tablet 6.25 mg  6.25 mg Oral BID WC Ward, Kristen N, DO   6.25 mg at 02/15/20 1203  . cholecalciferol (VITAMIN D) tablet 4,000 Units  4,000 Units Oral Daily Ward, Kristen N, DO   4,000 Units at 02/15/20 1205   . colchicine tablet 0.3 mg  0.3 mg Oral QODAY Ward, Kristen N, DO   0.3 mg at 02/15/20 1204  . dutasteride (AVODART) capsule 0.5 mg  0.5 mg Oral q morning Ward, Kristen N, DO   0.5 mg at 02/15/20 1202  . febuxostat (ULORIC) tablet 40 mg  40 mg Oral Daily Ward, Kristen N, DO   40 mg at 02/15/20 1204  . lisinopril (ZESTRIL) tablet 20 mg  20 mg Oral Daily Renda Rolls, RPH       Or  . hydrochlorothiazide (MICROZIDE) capsule 12.5 mg  12.5 mg Oral Daily Renda Rolls, RPH   12.5 mg at 02/15/20 1203  . pravastatin (PRAVACHOL) tablet 10 mg  10 mg Oral q1800 Ward, Kristen N, DO      . traZODone (DESYREL) tablet 50 mg  50 mg Oral QHS Ward, Delice Bison, DO       Current Outpatient Medications  Medication Sig Dispense Refill  . amLODipine (NORVASC) 5 MG  tablet Take 1 tablet (5 mg total) by mouth daily. 30 tablet 0  . aspirin EC 81 MG tablet Take 81 mg by mouth daily.     . carvedilol (COREG) 6.25 MG tablet Take 1 tablet (6.25 mg total) by mouth 2 (two) times daily with a meal. 60 tablet 0  . Cholecalciferol (VITAMIN D3) 50 MCG (2000 UT) capsule Take 4,000 Units by mouth daily.    . clobetasol ointment (TEMOVATE) 6.96 % Apply 1 application topically 2 (two) times daily as needed (skin irritations).    . colchicine 0.6 MG tablet Take 0.5 tablets (0.3 mg total) by mouth every other day. 7.5 tablet 0  . dutasteride (AVODART) 0.5 MG capsule Take 0.5 mg by mouth every morning.    . febuxostat (ULORIC) 40 MG tablet Take 40 mg by mouth daily.     . hydrocortisone 2.5 % ointment Apply 1 application topically daily.     Marland Kitchen ketoconazole (NIZORAL) 2 % cream Apply 1 application topically daily.     Marland Kitchen lisinopril-hydrochlorothiazide (PRINZIDE,ZESTORETIC) 20-12.5 MG tablet Take 1 tablet by mouth every morning.    . magnesium oxide (MAG-OX) 400 MG tablet Take 400 mg by mouth 2 (two) times daily.    . Omega-3 Fatty Acids (FISH OIL) 1000 MG CAPS Take 1,000 mg by mouth daily.    . pravastatin (PRAVACHOL) 10 MG tablet  Take 1 tablet (10 mg total) by mouth daily at 6 PM. 30 tablet 0  . traZODone (DESYREL) 50 MG tablet Take 50 mg by mouth at bedtime.      Musculoskeletal: Strength & Muscle Tone: within normal limits Gait & Station:  Not tested Patient leans: N/A  Psychiatric Specialty Exam: Physical Exam Vitals and nursing note reviewed.  Constitutional:      Appearance: He is well-developed and well-nourished.  HENT:     Head: Normocephalic and atraumatic.  Eyes:     Conjunctiva/sclera: Conjunctivae normal.     Pupils: Pupils are equal, round, and reactive to light.  Cardiovascular:     Heart sounds: Normal heart sounds.  Pulmonary:     Effort: Pulmonary effort is normal.  Abdominal:     Palpations: Abdomen is soft.  Musculoskeletal:        General: Normal range of motion.     Cervical back: Normal range of motion.  Skin:    General: Skin is warm and dry.  Neurological:     General: No focal deficit present.     Mental Status: He is alert.  Psychiatric:        Attention and Perception: He is inattentive.        Mood and Affect: Mood is anxious. Affect is blunt.        Speech: Speech is delayed.        Behavior: Behavior is slowed.        Thought Content: Thought content is not paranoid. Thought content does not include homicidal or suicidal ideation.        Cognition and Memory: Cognition is impaired. Memory is impaired.     Review of Systems  Constitutional: Negative.   HENT: Negative.   Eyes: Negative.   Respiratory: Negative.   Cardiovascular: Negative.   Gastrointestinal: Negative.   Musculoskeletal: Negative.   Skin: Negative.   Neurological: Negative.   Psychiatric/Behavioral: Positive for behavioral problems and confusion. The patient is nervous/anxious.     Blood pressure 108/60, pulse 69, temperature 98.1 F (36.7 C), temperature source Oral, resp. rate 16, height 5\' 10"  (1.778  m), weight 101.2 kg, SpO2 98 %.Body mass index is 32.01 kg/m.  General Appearance: Casual   Eye Contact:  Fair  Speech:  Slow  Volume:  Decreased  Mood:  Euthymic  Affect:  Constricted  Thought Process:  Coherent  Orientation:  Other:  He knew he was in the hospital and remembered coming here in the basic reason for it but he could not remember any details of the date time or year.  Thought Content:  Logical  Suicidal Thoughts:  No  Homicidal Thoughts:  No  Memory:  Immediate;   Fair Recent;   Poor Remote;   Poor  Judgement:  Fair  Insight:  Shallow  Psychomotor Activity:  Decreased and Tremor  Concentration:  Concentration: Poor  Recall:  Poor  Fund of Knowledge:  Fair  Language:  Fair  Akathisia:  No  Handed:  Right  AIMS (if indicated):     Assets:  Desire for Improvement Financial Resources/Insurance Housing Resilience Social Support  ADL's:  Impaired  Cognition:  Impaired,  Moderate  Sleep:        Treatment Plan Summary: Plan 70 year old man who is currently showing signs of very significant cognitive impairment across multiple domains that could be consistent with moderate to severe dementia.  This condition however seems to be a fairly recent worsening from his baseline and has been accompanied by some agitation at home.  Unlike many patients brought in with agitation he has not been aggressive or difficult here in the emergency room.  Work-up thus far includes a head CT that shows enlarged ventricles and decreased volume as well as diffuse white matter disease.  Labs otherwise not very revealing.  I recommend that the patient be referred for geriatric psychiatry evaluation and admission.  It is possible that this could be a reversible or treatable condition.  Case reviewed with ER physician and TTS.  Disposition: Recommend psychiatric Inpatient admission when medically cleared. Supportive therapy provided about ongoing stressors.  Alethia Berthold, MD 02/15/2020 3:55 PM

## 2020-02-15 NOTE — ED Notes (Signed)
Full linen changed and gown changed. New primofit placed on pt. Pt able to roll side to side.

## 2020-02-15 NOTE — BH Assessment (Signed)
Referral information for Psychiatric Hospitalization faxed to:  [facility] ([phone])  [facility] [phone] -or- [phone], [phone])  [facility] [phone] or [phone]),   .[facility] [phone] -or- [phone]),

## 2020-02-15 NOTE — ED Notes (Signed)
Report received from [facility], pt will be moved to 25 pending placement and psych eval. Pt will continue to be on cardiac monitoring per EDP order.

## 2020-02-15 NOTE — ED Notes (Signed)
Dr.Clapacs at bedside  

## 2020-02-15 NOTE — ED Notes (Signed)
Pt moved from hall bed to ED room 5. Placed on all monitors with bed alarm set. Fall band on wrist with yellow socks placed.

## 2020-02-15 NOTE — ED Provider Notes (Signed)
-----------------------------------------   8:22 AM on 02/15/2020 -----------------------------------------  Patient currently pending psychiatric evaluation as well as social work involvement for potential SNF placement.  The patient has been placed in psychiatric observation due to the need to provide a safe environment for the patient while obtaining psychiatric consultation and evaluation, as well as ongoing medical and medication management to treat the patient's condition.  The patient has been placed under full IVC at this time.    Blake Divine, MD 02/15/20 [phone]

## 2020-02-15 NOTE — ED Notes (Signed)
IVC pending consult   

## 2020-02-15 NOTE — BH Assessment (Signed)
Referral Checks:  Dean Lynch ([phone]) followed up at [phone] but no answer, voicemail was left  [facility] (-[phone] -or- [phone], [phone]) Dean Lynch reports denied due to not meeting acute criteria, patient did not having any SI/HI or psychosis and mainly has aggitation   [place] [phone] or [phone]), Staff reports patient has not yet been reviewed  .[place] [phone] -or- [phone]), Pam reports no Ger[facility] beds available at this time

## 2020-02-15 NOTE — ED Notes (Signed)
Pt resting with eyes closed, will continue to monitor.

## 2020-02-15 NOTE — ED Notes (Signed)
Pt moved to hospital bed for comfort, bed alarm activated.

## 2020-02-15 NOTE — ED Notes (Signed)
Learned at this time patient was IVC at this time.

## 2020-02-15 NOTE — ED Notes (Signed)
Pt had pulled primofit off and linen was saturated in urine. Full linen change. Pt cleaned of small brown BM. Chucks placed under pt.

## 2020-02-15 NOTE — ED Notes (Signed)
This nurse updated pt wife at this time

## 2020-02-15 NOTE — ED Notes (Signed)
Pt returned to paced rhythm. Pacemaker interagation in progress

## 2020-02-15 NOTE — ED Notes (Signed)
EKG changes noted on monitor, repeat EKG preformed with results brought to Dr. Beather Arbour to review in ED. Dr. Beather Arbour speaking with Dr. Leonides Schanz regarding changes.

## 2020-02-15 NOTE — TOC Progression Note (Signed)
Transition of Care [facility]) - Progression Note    Patient Details  Name: Dean Lynch MRN: [id number] Date of Birth: 1950-08-29  Transition of Care [facility]) CM/SW [place], [place] Phone Number: [phone] 02/15/2020, 10:58 AM  Clinical Narrative:     Patient from home due to altered mental status.  Patient pending IVC observation and assessment.  TOC will follow once patient's psychiatric status is confirmed.       Expected Discharge Plan and Services                                                 Social Determinants of Health (SDOH) Interventions    Readmission Risk Interventions No flowsheet data found.

## 2020-02-15 NOTE — ED Notes (Signed)
Wife at bedside. Visit supervised by Raynelle Highland, pt relations.

## 2020-02-15 NOTE — ED Notes (Signed)
Spoke with wife Adonis Huguenin at [phone] who states pt has been increasing confused and irritable over last few months. Reports he has made comments like "If you weren't a woman I would hit you." reports she has attempted to get him into primary but are not able to get into an appointment until April. She states he has gotten to the point where he sleeps most of the day, decreased fluid intake, and has been losing weight. Denies any falls.

## 2020-02-15 NOTE — ED Notes (Signed)
IVC  PENDING  PLACEMENT  PT  SEEN  BY  DR  Weber Cooks  MD

## 2020-02-16 MED ORDER — ACETAMINOPHEN 325 MG PO TABS
650.0000 mg | ORAL_TABLET | ORAL | Status: DC | PRN
  Filled 2020-02-16: qty 2

## 2020-02-16 MED ORDER — QUETIAPINE FUMARATE 25 MG PO TABS: 25.0000 mg | ORAL_TABLET | Freq: Two times a day (BID) | ORAL | 0 refills | Status: DC

## 2020-02-16 NOTE — ED Notes (Signed)
Wife here with dry clothes for pt.

## 2020-02-16 NOTE — ED Notes (Signed)
Pt unable to sign for discharge. Spoke with wife who is coming to pick him up.

## 2020-02-16 NOTE — Discharge Instructions (Addendum)
Take the Seroquel as prescribed.  Follow-up with your neurologist and primary care doctor as planned.  Return to the ER for new, worsening, or persistent severe changes in mental status, confusion, agitation or combativeness, weakness, or any other new or worsening symptoms that are concerning.

## 2020-02-16 NOTE — ED Notes (Signed)
Wife called. Wife expressed concern about colchicine and pravastatin on pt's med list (she looked in [place]). She says that cardiologist had discontinued these medications. Had lengthy conversation with her and told her that we can hold these meds.  Wife crying, wife stating that she needs doctor to call her and discuss plan with her. She states that pt had pacemaker placed in December, "then he slept all the time" after that, then about 2 weeks ago the altered mental status began. Informed her this nurse will attempt to find a doctor who can call her and discuss plan with her.

## 2020-02-16 NOTE — ED Notes (Signed)
Pt called nurse into room to request tylenol for HA. Communicated to EDP for PRN order.

## 2020-02-16 NOTE — ED Notes (Signed)
Provided breakfast tray. Repositioned pt in bed. Provided phone for pt to speak with wife. Pt states that wife said that "they told a big lie" about him "falling". Explained to pt that someone will be calling wife.

## 2020-02-16 NOTE — ED Notes (Signed)
IVC/Pending Placement 

## 2020-02-16 NOTE — ED Notes (Signed)
Message sent to Dr Weber Cooks and CSW re: night nurse spoke with wife last night who wanted update on plan of care and stated she did not want pt placed.

## 2020-02-16 NOTE — Consult Note (Signed)
[facility] Consult   Reason for Consult: Follow-up consult for 70 year old man brought into the emergency room for recent worsening of dementia Referring Physician: Siadecki Patient Identification: Dean Lynch MRN:  [id number] Principal Diagnosis: Agitation Diagnosis:  Principal Problem:   Agitation Active Problems:   Essential hypertension   Total Time spent with patient: 30 minutes  Subjective:   Dean Lynch is a 70 y.o. male patient admitted with "I am doing okay".  HPI: Patient seen chart reviewed.  Followed up with the patient's wife by telephone today.  The patient himself has no new complaints.  Slept adequately.  Denies depression.  Denies current hallucinations.  Has not shown agitation or aggression.  Speaking with his wife I got some clarification of the recent history but it all sounds consistent with what we had already thought.  Starting with his being given a pacemaker recently after an episode of bradycardia the patient has had a mental status change with more confusion, more fatigue during the day, episodes of agitation more frequently.  I advised his wife that testing in the emergency room shows some chronic abnormalities on the CT scan but no other acute medical explanation or sign of infection.  I gave the wife options about following up with admission or having the patient return home  Past Psychiatric History: No past psychiatric treatment.  Wife acknowledges there is been memory problems for some time but they had not been impairing  Risk to Self:   Risk to Others:   Prior Inpatient Therapy:   Prior Outpatient Therapy:    Past Medical History:  Past Medical History:  Diagnosis Date  . BPH (benign prostatic hyperplasia)   . Diabetes mellitus without complication ([place])   . Gout   . Hypercholesteremia   . Hypertension   . Skin cancer 2017   melanoa- removed at [facility]  . Thrombocytopenia ([place])     Past Surgical History:  Procedure  Laterality Date  . CATARACT EXTRACTION W/PHACO Right 09/27/2015   Procedure: CATARACT EXTRACTION PHACO AND INTRAOCULAR LENS PLACEMENT (IOC);  Surgeon: Leandrew Koyanagi, MD;  Location: [facility];  Service: Ophthalmology;  Laterality: Right;  DIABETIC  . KNEE ARTHROSCOPY    . PACEMAKER IMPLANT N/A 12/24/2019   Procedure: PACEMAKER IMPLANT;  Surgeon: Marzetta Board, MD;  Location: [facility];  Service: Cardiovascular;  Laterality: N/A;   Family History:  Family History  Problem Relation Age of Onset  . Coronary artery disease Mother   . Polycythemia Mother   . Macular degeneration Mother   . Dementia Mother   . Coronary artery disease Father    Family Psychiatric  History: Mother had dementia Social History:  Social History   Substance and Sexual Activity  Alcohol Use No     Social History   Substance and Sexual Activity  Drug Use No    Social History   Socioeconomic History  . Marital status: Married    Spouse name: Not on file  . Number of children: Not on file  . Years of education: Not on file  . Highest education level: Not on file  Occupational History  . Not on file  Tobacco Use  . Smoking status: Never Smoker  . Smokeless tobacco: Never Used  Vaping Use  . Vaping Use: Never used  Substance and Sexual Activity  . Alcohol use: No  . Drug use: No  . Sexual activity: Yes  Other Topics Concern  . Not on file  Social History  Narrative  . Not on file   Social Determinants of Health   Financial Resource Strain: Not on file  Food Insecurity: Not on file  Transportation Needs: Not on file  Physical Activity: Not on file  Stress: Not on file  Social Connections: Not on file   Additional Social History:    Allergies:   Allergies  Allergen Reactions  . Fenofibrate Micronized Other (See Comments)    Elevated Liver Enzymes    Labs:  Results for orders placed or performed during the hospital encounter of 02/14/20 (from the past 48  hour(s))  CBG monitoring, ED     Status: Abnormal   Collection Time: 02/14/20  8:05 PM  Result Value Ref Range   Glucose-Capillary 116 (H) 70 - 99 mg/dL    Comment: Glucose reference range applies only to samples taken after fasting for at least 8 hours.  Comprehensive metabolic panel     Status: Abnormal   Collection Time: 02/14/20  8:06 PM  Result Value Ref Range   Sodium 139 135 - 145 mmol/L   Potassium 5.3 (H) 3.5 - 5.1 mmol/L   Chloride 107 98 - 111 mmol/L   CO2 25 22 - 32 mmol/L   Glucose, Bld 135 (H) 70 - 99 mg/dL    Comment: Glucose reference range applies only to samples taken after fasting for at least 8 hours.   BUN 44 (H) 8 - 23 mg/dL   Creatinine, Ser 1.80 (H) 0.61 - 1.24 mg/dL   Calcium 9.8 8.9 - 10.3 mg/dL   Total Protein 6.3 (L) 6.5 - 8.1 g/dL   Albumin 3.6 3.5 - 5.0 g/dL   AST 34 15 - 41 U/L   ALT 34 0 - 44 U/L   Alkaline Phosphatase 89 38 - 126 U/L   Total Bilirubin 2.2 (H) 0.3 - 1.2 mg/dL   GFR, Estimated 40 (L) >60 mL/min    Comment: (NOTE) Calculated using the CKD-EPI Creatinine Equation (2021)    Anion gap 7 5 - 15    Comment: Performed at [facility], [address] [place], [place] [address]  CBC     Status: Abnormal   Collection Time: 02/14/20  8:06 PM  Result Value Ref Range   WBC 9.7 4.0 - 10.5 K/uL   RBC 3.68 (L) 4.22 - 5.81 MIL/uL   Hemoglobin 13.4 13.0 - 17.0 g/dL   HCT 36.7 (L) 39.0 - 52.0 %   MCV 99.7 80.0 - 100.0 fL   MCH 36.4 (H) 26.0 - 34.0 pg   MCHC 36.5 (H) 30.0 - 36.0 g/dL   RDW 13.1 11.5 - 15.5 %   Platelets 84 (L) 150 - 400 K/uL    Comment: Immature Platelet Fraction may be clinically indicated, consider ordering this additional test [id number]    nRBC 0.0 0.0 - 0.2 %    Comment: Performed at [facility], [address] [place], [place] [address]  Urinalysis, Complete w Microscopic Urine, Random     Status: Abnormal   Collection Time: 02/14/20  8:06 PM  Result Value Ref Range   Color, Urine YELLOW (A)  YELLOW   APPearance CLEAR (A) CLEAR   Specific Gravity, Urine 1.014 1.005 - 1.030   pH 7.0 5.0 - 8.0   Glucose, UA NEGATIVE NEGATIVE mg/dL   Hgb urine dipstick NEGATIVE NEGATIVE   Bilirubin Urine NEGATIVE NEGATIVE   Ketones, ur NEGATIVE NEGATIVE mg/dL   Protein, ur NEGATIVE NEGATIVE mg/dL   Nitrite NEGATIVE NEGATIVE   Leukocytes,Ua NEGATIVE  NEGATIVE   WBC, UA 0-5 0 - 5 WBC/hpf   Bacteria, UA RARE (A) NONE SEEN   Squamous Epithelial / LPF 0-5 0 - 5    Comment: Performed at [facility], [place], [place] [address]  SARS CORONAVIRUS 2 (TAT 6-24 HRS) Nasopharyngeal Nasopharyngeal Swab     Status: None   Collection Time: 02/15/20  1:01 AM   Specimen: Nasopharyngeal Swab  Result Value Ref Range   SARS Coronavirus 2 NEGATIVE NEGATIVE    Comment: (NOTE) SARS-CoV-2 target nucleic acids are NOT DETECTED.  The SARS-CoV-2 RNA is generally detectable in upper and lower respiratory specimens during the acute phase of infection. Negative results do not preclude SARS-CoV-2 infection, do not rule out co-infections with other pathogens, and should not be used as the sole basis for treatment or other patient management decisions. Negative results must be combined with clinical observations, patient history, and epidemiological information. The expected result is Negative.  Fact Sheet for Patients: SugarRoll.be  Fact Sheet for Healthcare Providers: https://www.woods-mathews.com/  This test is not yet approved or cleared by the [place] FDA and  has been authorized for detection and/or diagnosis of SARS-CoV-2 by FDA under an Emergency Use Authorization (EUA). This EUA will remain  in effect (meaning this test can be used) for the duration of the COVID-19 declaration under Se ction 564(b)(1) of the Act, 21 U.S.C. section 360bbb-3(b)(1), unless the authorization is terminated or revoked sooner.  Performed at [facility], [place] [address] [place], [place] [address]   Basic metabolic panel     Status: Abnormal   Collection Time: 02/15/20  2:45 AM  Result Value Ref Range   Sodium 138 135 - 145 mmol/L   Potassium 5.0 3.5 - 5.1 mmol/L   Chloride 111 98 - 111 mmol/L   CO2 20 (L) 22 - 32 mmol/L   Glucose, Bld 105 (H) 70 - 99 mg/dL    Comment: Glucose reference range applies only to samples taken after fasting for at least 8 hours.   BUN 41 (H) 8 - 23 mg/dL   Creatinine, Ser 1.49 (H) 0.61 - 1.24 mg/dL   Calcium 9.0 8.9 - 10.3 mg/dL   GFR, Estimated 51 (L) >60 mL/min    Comment: (NOTE) Calculated using the CKD-EPI Creatinine Equation (2021)    Anion gap 7 5 - 15    Comment: Performed at [facility], [place], [place] [address]  Troponin I (High Sensitivity)     Status: None   Collection Time: 02/15/20  4:01 AM  Result Value Ref Range   Troponin I (High Sensitivity) 6 <18 ng/L    Comment: (NOTE) Elevated high sensitivity troponin I (hsTnI) values and significant  changes across serial measurements may suggest ACS but many other  chronic and acute conditions are known to elevate hsTnI results.  Refer to the "Links" section for chest pain algorithms and additional  guidance. Performed at [facility], [address] [place], [place] [address]   Magnesium     Status: None   Collection Time: 02/15/20  4:01 AM  Result Value Ref Range   Magnesium 1.9 1.7 - 2.4 mg/dL    Comment: Performed at [facility], [address] [place], [place] [address]  Troponin I (High Sensitivity)     Status: None   Collection Time: 02/15/20  5:57 AM  Result Value Ref Range   Troponin I (High Sensitivity) 9 <18 ng/L  Comment: (NOTE) Elevated high sensitivity troponin I (hsTnI) values and significant  changes across serial measurements may suggest ACS but many other  chronic and acute conditions are known to elevate hsTnI results.  Refer to the "Links" section for  chest pain algorithms and additional  guidance. Performed at [facility], [address], [place],  [address]     Current Facility-Administered Medications  Medication Dose Route Frequency Provider Last Rate Last Admin  . acetaminophen (TYLENOL) tablet 650 mg  650 mg Oral Q4H PRN Arta Silence, MD      . amLODipine (NORVASC) tablet 5 mg  5 mg Oral Daily Ward, Kristen N, DO   5 mg at 02/16/20 0929  . aspirin EC tablet 81 mg  81 mg Oral Daily Ward, Kristen N, DO   81 mg at 02/16/20 0931  . carvedilol (COREG) tablet 6.25 mg  6.25 mg Oral BID WC Ward, Kristen N, DO   6.25 mg at 02/16/20 0932  . cholecalciferol (VITAMIN D) tablet 4,000 Units  4,000 Units Oral Daily Ward, Kristen N, DO   4,000 Units at 02/16/20 0930  . dutasteride (AVODART) capsule 0.5 mg  0.5 mg Oral q morning Ward, Kristen N, DO   0.5 mg at 02/15/20 1202  . febuxostat (ULORIC) tablet 40 mg  40 mg Oral Daily Ward, Kristen N, DO   40 mg at 02/15/20 1204  . lisinopril (ZESTRIL) tablet 20 mg  20 mg Oral Daily Renda Rolls, RPH   20 mg at 02/16/20 9323   Or  . hydrochlorothiazide (MICROZIDE) capsule 12.5 mg  12.5 mg Oral Daily Renda Rolls, RPH   12.5 mg at 02/15/20 1203  . traZODone (DESYREL) tablet 50 mg  50 mg Oral QHS Ward, Kristen N, DO   50 mg at 02/16/20 5573   Current Outpatient Medications  Medication Sig Dispense Refill  . QUEtiapine (SEROQUEL) 25 MG tablet Take 1 tablet (25 mg total) by mouth 2 (two) times daily. Take one at bedtime plus one daily as needed only for agitation 60 tablet 0  . amLODipine (NORVASC) 5 MG tablet Take 1 tablet (5 mg total) by mouth daily. 30 tablet 0  . aspirin EC 81 MG tablet Take 81 mg by mouth daily.     . carvedilol (COREG) 6.25 MG tablet Take 1 tablet (6.25 mg total) by mouth 2 (two) times daily with a meal. 60 tablet 0  . Cholecalciferol (VITAMIN D3) 50 MCG (2000 UT) capsule Take 4,000 Units by mouth daily.    . clobetasol ointment (TEMOVATE) 2.20 % Apply 1  application topically 2 (two) times daily as needed (skin irritations).    . colchicine 0.6 MG tablet Take 0.5 tablets (0.3 mg total) by mouth every other day. 7.5 tablet 0  . dutasteride (AVODART) 0.5 MG capsule Take 0.5 mg by mouth every morning.    . febuxostat (ULORIC) 40 MG tablet Take 40 mg by mouth daily.     . hydrocortisone 2.5 % ointment Apply 1 application topically daily.     Marland Kitchen ketoconazole (NIZORAL) 2 % cream Apply 1 application topically daily.     Marland Kitchen lisinopril-hydrochlorothiazide (PRINZIDE,ZESTORETIC) 20-12.5 MG tablet Take 1 tablet by mouth every morning.    . magnesium oxide (MAG-OX) 400 MG tablet Take 400 mg by mouth 2 (two) times daily.    . Omega-3 Fatty Acids (FISH OIL) 1000 MG CAPS Take 1,000 mg by mouth daily.    . pravastatin (PRAVACHOL) 10 MG tablet Take 1 tablet (10 mg  total) by mouth daily at 6 PM. 30 tablet 0  . traZODone (DESYREL) 50 MG tablet Take 50 mg by mouth at bedtime.      Musculoskeletal: Strength & Muscle Tone: within normal limits Gait & Station: Not evaluated Patient leans: N/A  Psychiatric Specialty Exam: Physical Exam Vitals and nursing note reviewed.  Constitutional:      Appearance: He is well-developed and well-nourished.  HENT:     Head: Normocephalic and atraumatic.  Eyes:     Conjunctiva/sclera: Conjunctivae normal.     Pupils: Pupils are equal, round, and reactive to light.  Cardiovascular:     Heart sounds: Normal heart sounds.  Pulmonary:     Effort: Pulmonary effort is normal.  Abdominal:     Palpations: Abdomen is soft.  Musculoskeletal:        General: Normal range of motion.     Cervical back: Normal range of motion.  Skin:    General: Skin is warm and dry.  Neurological:     General: No focal deficit present.     Mental Status: He is alert.     Motor: Tremor present.  Psychiatric:        Attention and Perception: Attention normal.        Mood and Affect: Mood normal.        Speech: Speech is delayed.         Behavior: Behavior is slowed.        Thought Content: Thought content is not paranoid. Thought content does not include homicidal or suicidal ideation.        Cognition and Memory: Cognition is impaired. Memory is impaired.     Review of Systems  Constitutional: Negative.   HENT: Negative.   Eyes: Negative.   Respiratory: Negative.   Cardiovascular: Negative.   Gastrointestinal: Negative.   Musculoskeletal: Negative.   Skin: Negative.   Neurological: Negative.   Psychiatric/Behavioral: Negative.     Blood pressure 130/64, pulse 72, temperature 98 F (36.7 C), temperature source Oral, resp. rate 20, height 5\' 10"  (1.778 m), weight 101.2 kg, SpO2 97 %.Body mass index is 32.01 kg/m.  General Appearance: Fairly Groomed  Eye Contact:  Fair  Speech:  Clear and Coherent  Volume:  Decreased  Mood:  Euthymic  Affect:  Constricted  Thought Process:  Coherent  Orientation:  Full (Time, Place, and Person)  Thought Content:  Logical  Suicidal Thoughts:  No  Homicidal Thoughts:  No  Memory:  Immediate;   Fair Recent;   Poor Remote;   Fair  Judgement:  Fair  Insight:  Fair  Psychomotor Activity:  Decreased  Concentration:  Concentration: Poor  Recall:  Poor  Fund of Knowledge:  Fair  Language:  Fair  Akathisia:  No  Handed:  Right  AIMS (if indicated):     Assets:  Desire for Improvement Housing Social Support  ADL's:  Impaired  Cognition:  Impaired,  Mild  Sleep:        Treatment Plan Summary: Medication management and Plan As mentioned above I spoke with his wife and answered her questions.  I advised her that at this point the differential diagnosis included dementia due to vascular changes, rapid worsening of dementia from Alzheimer's disease and the possibility of other as yet undetermined reasons.  Also pression would still be in the differential diagnosis although at this point patient is not showing enough signs and complaints of depression to start medication.  Wife  told me that they do have an  appointment to see Dr. Manuella Ghazi the neurologist in a couple weeks.  I strongly support this.  Wife prefers to have the patient come home and feels safest and most comfortable with this.  I am agreeable to this plan as she understands the nature of the situation and understands that they could come back to the ER if needed.  I am going to add a prescription for low-dose of Seroquel 25 mg at night and another 25 as needed during the day for agitation.  Side effects and use discussed.  Case reviewed with ER doctor.  No longer meets commitment criteria.  Discontinue IVC and patient can be discharged home.  Disposition: No evidence of imminent risk to self or others at present.   Patient does not meet criteria for psychiatric inpatient admission. Supportive therapy provided about ongoing stressors.  Alethia Berthold, MD 02/16/2020 11:32 AM

## 2020-02-16 NOTE — ED Notes (Signed)
Spoke with wife regarding discharge. She is 15 min away and will come pick him up. Dr Weber Cooks spoke with her and IVC status was removed.

## 2020-02-16 NOTE — ED Notes (Signed)
Wife wants to bring dry clothes for pt. She is on way.

## 2020-02-16 NOTE — ED Provider Notes (Signed)
-----------------------------------------   11:12 AM on 02/16/2020 -----------------------------------------  The patient has been reevaluated by Dr. Weber Cooks who had a discussion with his wife.  The wife would prefer to take the patient home and pursue outpatient follow-up.  Dr. Weber Cooks has rescinded the IVC and prescribed Seroquel.  At this time, the patient remains calm and cooperative with no emergent medical issues and is stable for discharge home.  Return precautions provided.   Arta Silence, MD 02/16/20 1113

## 2020-02-17 DIAGNOSIS — R4189 Other symptoms and signs involving cognitive functions and awareness: Secondary | ICD-10-CM | POA: Diagnosis not present

## 2020-02-17 DIAGNOSIS — F0391 Unspecified dementia with behavioral disturbance: Secondary | ICD-10-CM | POA: Diagnosis not present

## 2020-02-17 DIAGNOSIS — F0280 Dementia in other diseases classified elsewhere without behavioral disturbance: Secondary | ICD-10-CM | POA: Diagnosis not present

## 2020-02-17 DIAGNOSIS — G939 Disorder of brain, unspecified: Secondary | ICD-10-CM | POA: Diagnosis not present

## 2020-02-17 DIAGNOSIS — F0150 Vascular dementia without behavioral disturbance: Secondary | ICD-10-CM | POA: Diagnosis not present

## 2020-02-17 DIAGNOSIS — E519 Thiamine deficiency, unspecified: Secondary | ICD-10-CM | POA: Diagnosis not present

## 2020-02-17 DIAGNOSIS — E538 Deficiency of other specified B group vitamins: Secondary | ICD-10-CM | POA: Diagnosis not present

## 2020-02-17 DIAGNOSIS — G309 Alzheimer's disease, unspecified: Secondary | ICD-10-CM | POA: Diagnosis not present

## 2020-02-19 ENCOUNTER — Observation Stay: Payer: PPO

## 2020-02-19 ENCOUNTER — Emergency Department: Payer: PPO

## 2020-02-19 ENCOUNTER — Other Ambulatory Visit: Payer: Self-pay

## 2020-02-19 ENCOUNTER — Inpatient Hospital Stay
Admission: EM | Admit: 2020-02-19 | Discharge: 2020-02-21 | DRG: 640 | Disposition: A | Discharge status: Home Health | Payer: PPO | Attending: Internal Medicine | Admitting: Internal Medicine

## 2020-02-19 DIAGNOSIS — E1122 Type 2 diabetes mellitus with diabetic chronic kidney disease: Secondary | ICD-10-CM | POA: Diagnosis present

## 2020-02-19 DIAGNOSIS — Z85820 Personal history of malignant melanoma of skin: Secondary | ICD-10-CM

## 2020-02-19 DIAGNOSIS — Z961 Presence of intraocular lens: Secondary | ICD-10-CM | POA: Diagnosis not present

## 2020-02-19 DIAGNOSIS — E785 Hyperlipidemia, unspecified: Secondary | ICD-10-CM | POA: Diagnosis present

## 2020-02-19 DIAGNOSIS — N1831 Chronic kidney disease, stage 3a: Secondary | ICD-10-CM | POA: Diagnosis present

## 2020-02-19 DIAGNOSIS — R112 Nausea with vomiting, unspecified: Secondary | ICD-10-CM | POA: Diagnosis not present

## 2020-02-19 DIAGNOSIS — G9341 Metabolic encephalopathy: Secondary | ICD-10-CM | POA: Diagnosis present

## 2020-02-19 DIAGNOSIS — R400 Somnolence: Secondary | ICD-10-CM

## 2020-02-19 DIAGNOSIS — Z66 Do not resuscitate: Secondary | ICD-10-CM | POA: Diagnosis present

## 2020-02-19 DIAGNOSIS — D696 Thrombocytopenia, unspecified: Secondary | ICD-10-CM | POA: Diagnosis present

## 2020-02-19 DIAGNOSIS — K7460 Unspecified cirrhosis of liver: Secondary | ICD-10-CM | POA: Diagnosis not present

## 2020-02-19 DIAGNOSIS — N200 Calculus of kidney: Secondary | ICD-10-CM | POA: Diagnosis not present

## 2020-02-19 DIAGNOSIS — N179 Acute kidney failure, unspecified: Secondary | ICD-10-CM | POA: Diagnosis not present

## 2020-02-19 DIAGNOSIS — Z79899 Other long term (current) drug therapy: Secondary | ICD-10-CM

## 2020-02-19 DIAGNOSIS — Z9841 Cataract extraction status, right eye: Secondary | ICD-10-CM | POA: Diagnosis not present

## 2020-02-19 DIAGNOSIS — Z8249 Family history of ischemic heart disease and other diseases of the circulatory system: Secondary | ICD-10-CM

## 2020-02-19 DIAGNOSIS — R531 Weakness: Secondary | ICD-10-CM | POA: Diagnosis not present

## 2020-02-19 DIAGNOSIS — L89301 Pressure ulcer of unspecified buttock, stage 1: Secondary | ICD-10-CM | POA: Diagnosis not present

## 2020-02-19 DIAGNOSIS — I5032 Chronic diastolic (congestive) heart failure: Secondary | ICD-10-CM | POA: Diagnosis not present

## 2020-02-19 DIAGNOSIS — R4182 Altered mental status, unspecified: Secondary | ICD-10-CM | POA: Diagnosis not present

## 2020-02-19 DIAGNOSIS — L8990 Pressure ulcer of unspecified site, unspecified stage: Secondary | ICD-10-CM | POA: Insufficient documentation

## 2020-02-19 DIAGNOSIS — N400 Enlarged prostate without lower urinary tract symptoms: Secondary | ICD-10-CM | POA: Diagnosis present

## 2020-02-19 DIAGNOSIS — F0390 Unspecified dementia without behavioral disturbance: Secondary | ICD-10-CM | POA: Diagnosis present

## 2020-02-19 DIAGNOSIS — E7800 Pure hypercholesterolemia, unspecified: Secondary | ICD-10-CM | POA: Diagnosis not present

## 2020-02-19 DIAGNOSIS — M1A9XX0 Chronic gout, unspecified, without tophus (tophi): Secondary | ICD-10-CM | POA: Diagnosis not present

## 2020-02-19 DIAGNOSIS — Z7982 Long term (current) use of aspirin: Secondary | ICD-10-CM

## 2020-02-19 DIAGNOSIS — Z20822 Contact with and (suspected) exposure to covid-19: Secondary | ICD-10-CM | POA: Diagnosis present

## 2020-02-19 DIAGNOSIS — R1111 Vomiting without nausea: Secondary | ICD-10-CM | POA: Diagnosis not present

## 2020-02-19 DIAGNOSIS — E860 Dehydration: Principal | ICD-10-CM | POA: Diagnosis present

## 2020-02-19 DIAGNOSIS — E1129 Type 2 diabetes mellitus with other diabetic kidney complication: Secondary | ICD-10-CM | POA: Diagnosis present

## 2020-02-19 DIAGNOSIS — W19XXXA Unspecified fall, initial encounter: Secondary | ICD-10-CM | POA: Diagnosis not present

## 2020-02-19 DIAGNOSIS — I10 Essential (primary) hypertension: Secondary | ICD-10-CM | POA: Diagnosis not present

## 2020-02-19 DIAGNOSIS — R001 Bradycardia, unspecified: Secondary | ICD-10-CM | POA: Diagnosis not present

## 2020-02-19 DIAGNOSIS — R4020 Unspecified coma: Secondary | ICD-10-CM | POA: Diagnosis not present

## 2020-02-19 DIAGNOSIS — T887XXA Unspecified adverse effect of drug or medicament, initial encounter: Secondary | ICD-10-CM | POA: Diagnosis not present

## 2020-02-19 DIAGNOSIS — K8020 Calculus of gallbladder without cholecystitis without obstruction: Secondary | ICD-10-CM | POA: Diagnosis not present

## 2020-02-19 DIAGNOSIS — I130 Hypertensive heart and chronic kidney disease with heart failure and stage 1 through stage 4 chronic kidney disease, or unspecified chronic kidney disease: Secondary | ICD-10-CM | POA: Diagnosis not present

## 2020-02-19 DIAGNOSIS — Z950 Presence of cardiac pacemaker: Secondary | ICD-10-CM | POA: Diagnosis not present

## 2020-02-19 LAB — COMPREHENSIVE METABOLIC PANEL
ALT: 25 U/L (ref 0–44)
ALT: 24 U/L (ref 0–44)
AST: 33 U/L (ref 15–41)
AST: 31 U/L (ref 15–41)
Albumin: 3.1 g/dL — ABNORMAL LOW (ref 3.5–5.0)
Albumin: 3 g/dL — ABNORMAL LOW (ref 3.5–5.0)
Alkaline Phosphatase: 67 U/L (ref 38–126)
Alkaline Phosphatase: 65 U/L (ref 38–126)
Anion gap: 10 (ref 5–15)
Anion gap: 12 (ref 5–15)
BUN: 71 mg/dL — ABNORMAL HIGH (ref 8–23)
BUN: 69 mg/dL — ABNORMAL HIGH (ref 8–23)
CO2: 24 mmol/L (ref 22–32)
CO2: 21 mmol/L — ABNORMAL LOW (ref 22–32)
Calcium: 9.1 mg/dL (ref 8.9–10.3)
Calcium: 9.1 mg/dL (ref 8.9–10.3)
Chloride: 104 mmol/L (ref 98–111)
Chloride: 105 mmol/L (ref 98–111)
Creatinine, Ser: 2.54 mg/dL — ABNORMAL HIGH (ref 0.61–1.24)
Creatinine, Ser: 2.42 mg/dL — ABNORMAL HIGH (ref 0.61–1.24)
GFR, Estimated: 27 mL/min — ABNORMAL LOW (ref >60)
GFR, Estimated: 28 mL/min — ABNORMAL LOW (ref >60)
Glucose, Bld: 177 mg/dL — ABNORMAL HIGH (ref 70–99)
Glucose, Bld: 161 mg/dL — ABNORMAL HIGH (ref 70–99)
Potassium: 5 mmol/L (ref 3.5–5.1)
Potassium: 4.9 mmol/L (ref 3.5–5.1)
Sodium: 138 mmol/L (ref 135–145)
Sodium: 138 mmol/L (ref 135–145)
Total Bilirubin: 1.8 mg/dL — ABNORMAL HIGH (ref 0.3–1.2)
Total Bilirubin: 2 mg/dL — ABNORMAL HIGH (ref 0.3–1.2)
Total Protein: 5.4 g/dL — ABNORMAL LOW (ref 6.5–8.1)
Total Protein: 5.3 g/dL — ABNORMAL LOW (ref 6.5–8.1)

## 2020-02-19 LAB — URINALYSIS, COMPLETE (UACMP) WITH MICROSCOPIC
Bacteria, UA: NONE SEEN
Bilirubin Urine: NEGATIVE
Glucose, UA: NEGATIVE mg/dL
Hgb urine dipstick: NEGATIVE
Ketones, ur: NEGATIVE mg/dL
Leukocytes,Ua: NEGATIVE
Nitrite: NEGATIVE
Protein, ur: NEGATIVE mg/dL
Specific Gravity, Urine: 1.008 (ref 1.005–1.030)
pH: 7 (ref 5.0–8.0)

## 2020-02-19 LAB — MAGNESIUM: Magnesium: 2.3 mg/dL (ref 1.7–2.4)

## 2020-02-19 LAB — CBC WITH DIFFERENTIAL/PLATELET
Abs Immature Granulocytes: 0.01 10*3/uL (ref 0.00–0.07)
Basophils Absolute: 0 10*3/uL (ref 0.0–0.1)
Basophils Relative: 0 %
Eosinophils Absolute: 0.1 10*3/uL (ref 0.0–0.5)
Eosinophils Relative: 2 %
HCT: 32.4 % — ABNORMAL LOW (ref 39.0–52.0)
Hemoglobin: 12 g/dL — ABNORMAL LOW (ref 13.0–17.0)
Immature Granulocytes: 0 %
Lymphocytes Relative: 22 %
Lymphs Abs: 1.5 10*3/uL (ref 0.7–4.0)
MCH: 35.8 pg — ABNORMAL HIGH (ref 26.0–34.0)
MCHC: 37 g/dL — ABNORMAL HIGH (ref 30.0–36.0)
MCV: 96.7 fL (ref 80.0–100.0)
Monocytes Absolute: 0.4 10*3/uL (ref 0.1–1.0)
Monocytes Relative: 7 %
Neutro Abs: 4.5 10*3/uL (ref 1.7–7.7)
Neutrophils Relative %: 69 %
Platelets: 59 10*3/uL — ABNORMAL LOW (ref 150–400)
RBC: 3.35 MIL/uL — ABNORMAL LOW (ref 4.22–5.81)
RDW: 12.4 % (ref 11.5–15.5)
WBC: 6.6 10*3/uL (ref 4.0–10.5)
nRBC: 0 % (ref 0.0–0.2)

## 2020-02-19 LAB — URINE DRUG SCREEN, QUALITATIVE (ARMC ONLY)
Amphetamines, Ur Screen: NOT DETECTED
Barbiturates, Ur Screen: NOT DETECTED
Benzodiazepine, Ur Scrn: NOT DETECTED
Cannabinoid 50 Ng, Ur ~~LOC~~: NOT DETECTED
Cocaine Metabolite,Ur ~~LOC~~: NOT DETECTED
MDMA (Ecstasy)Ur Screen: NOT DETECTED
Methadone Scn, Ur: NOT DETECTED
Opiate, Ur Screen: NOT DETECTED
Phencyclidine (PCP) Ur S: NOT DETECTED
Tricyclic, Ur Screen: NOT DETECTED

## 2020-02-19 LAB — AMMONIA: Ammonia: 68 umol/L — ABNORMAL HIGH (ref 9–35)

## 2020-02-19 LAB — TROPONIN I (HIGH SENSITIVITY)
Troponin I (High Sensitivity): 8 ng/L (ref <18)
Troponin I (High Sensitivity): 8 ng/L (ref <18)

## 2020-02-19 LAB — LIPASE, BLOOD: Lipase: 87 U/L — ABNORMAL HIGH (ref 11–51)

## 2020-02-19 LAB — BRAIN NATRIURETIC PEPTIDE: B Natriuretic Peptide: 23.3 pg/mL (ref 0.0–100.0)

## 2020-02-19 MED ORDER — LACTATED RINGERS IV SOLN: INTRAVENOUS | Status: DC

## 2020-02-19 MED ORDER — ACETAMINOPHEN 325 MG PO TABS
650.0000 mg | ORAL_TABLET | Freq: Four times a day (QID) | ORAL | Status: DC | PRN
  Administered 2020-02-21: 650 mg via ORAL
  Filled 2020-02-19: qty 2

## 2020-02-19 MED ORDER — ENOXAPARIN SODIUM 40 MG/0.4ML ~~LOC~~ SOLN: 40.0000 mg | SUBCUTANEOUS | Status: DC

## 2020-02-19 MED ORDER — ONDANSETRON HCL 4 MG/2ML IJ SOLN
4.0000 mg | Freq: Once | INTRAMUSCULAR | Status: AC
  Administered 2020-02-19: 4 mg via INTRAVENOUS
  Filled 2020-02-19: qty 2

## 2020-02-19 MED ORDER — HYDRALAZINE HCL 20 MG/ML IJ SOLN: 5.0000 mg | INTRAMUSCULAR | Status: DC | PRN

## 2020-02-19 MED ORDER — ACETAMINOPHEN 650 MG RE SUPP: 650.0000 mg | Freq: Four times a day (QID) | RECTAL | Status: DC | PRN

## 2020-02-19 MED ORDER — LACTATED RINGERS IV BOLUS
1000.0000 mL | Freq: Once | INTRAVENOUS | Status: AC
  Administered 2020-02-19: 1000 mL via INTRAVENOUS

## 2020-02-19 MED ORDER — ONDANSETRON HCL 4 MG/2ML IJ SOLN: 4.0000 mg | Freq: Three times a day (TID) | INTRAMUSCULAR | Status: DC | PRN

## 2020-02-19 NOTE — TOC Initial Note (Signed)
Transition of Care [facility]) - Initial/Assessment Note    Patient Details  Name: Dean Lynch MRN: [id number] Date of Birth: 05-14-1950  [facility] CM/SW Contact:    Anselm Pancoast, RN Phone Number: 02/19/2020, 11:19 AM  Clinical Narrative:                 Spoke to Dr. Amalia Hailey @ [facility] who was reviewing case due to frequent ED visits and has agreed to cover 14 days of SNF if family desires to pursue placement. Hopes are this will allow the family time to prepare for long term plans.   [facility] staff notified.   Expected Discharge Plan: Skilled Nursing Facility Barriers to Discharge: Continued Medical Work up   Patient Goals and CMS Choice Patient states their goals for this hospitalization and ongoing recovery are:: Wants to go home.      Expected Discharge Plan and Services Expected Discharge Plan: [place]                                              Prior Living Arrangements/Services     Patient language and need for interpreter reviewed:: No Do you feel safe going back to the place where you live?: Yes      Need for Family Participation in Patient Care: Yes (Comment) Care giver support system in place?: Yes (comment)   Criminal Activity/Legal Involvement Pertinent to Current Situation/Hospitalization: No - Comment as needed  Activities of Daily Living      Permission Sought/Granted      Share Information with NAME: Ean Gettel, spouse [phone] or  [phone]           Emotional Assessment Appearance:: Appears older than stated age Attitude/Demeanor/Rapport: Unable to Assess Affect (typically observed): Unable to Assess Orientation: : Fluctuating Orientation (Suspected and/or reported Sundowners) Alcohol / Substance Use: Not Applicable Psych Involvement: Yes (comment)  Admission diagnosis:  Vomitting,ams ems  Patient Active Problem List   Diagnosis Date Noted  . Agitation 02/15/2020  . Bradycardia  12/23/2019  . Acute CHF (congestive heart failure) ([place]) 12/23/2019  . Thrombocytopenia ([place]) 07/18/2016  . Benign prostatic hyperplasia without lower urinary tract symptoms 07/16/2016  . History of melanoma 12/06/2015  . Diabetes mellitus type 2, uncomplicated ([place]) 51/70/0174  . 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 tophus 12/15/2013  . Essential hypertension 12/15/2013  . Hypercholesterolemia 12/15/2013   PCP:  Toni Arthurs, NP Pharmacy:   [facility] [address], [place] - [place] AT [facility] [address] [place] [address] Phone: [phone] Fax: [phone]     Social Determinants of Health (SDOH) Interventions    Readmission Risk Interventions No flowsheet data found.

## 2020-02-19 NOTE — ED Notes (Signed)
Pt cleansed of urine. Sheets changed. Primafit placed on pt. Pt remains responsive to sternal rub only. No more emesis events since arrival. Wife updated via phone.

## 2020-02-19 NOTE — ED Notes (Signed)
Took over care of pt. No change in pt condition. Pt in NAD at this time.  VSS.  Awaiting further orders. Will continue to monitor.

## 2020-02-19 NOTE — ED Notes (Signed)
Pt deep suctioned by respiratory. Pt desatted to 85% then came back up with room air.

## 2020-02-19 NOTE — H&P (Addendum)
History and Physical    Dean Lynch Dean Lynch DOB: 25-Oct-1950 DOA: 02/19/2020  Referring MD/NP/PA:   PCP: Toni Arthurs, NP   Patient coming from:  The patient is coming from home.  At baseline, pt is independent for most of ADL.        Chief Complaint: AMS and nausea vomiting  HPI: Dean Lynch is a 70 y.o. 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  Pt was recently observed in the ED from 2/7-2/9 due to possible dementia with behavior change.  Patient was evaluated by psychiatrist Dean Lynch and started patient with Seroquel. Pt was followed up by Dean Lynch of neurology 02/17/2020.  Per Dr. Trena Platt clinic note, patient's dementia is likely due to multifactorial etiology, "cannot rule delirium vs out components of Frontotemporal Dementia versus Lewy Body Dementia (hallucinations, agitation)".  Patient was started on Depakote by Dean Lynch. Dean Lynch also checked TSH, Vit B12, Vit B1, folate, Treponema Pallidum (syphilis)", results are pending.   Per his wife, pt was recently started on Seroquel, but has not taken it in the past 36 hours.  pt is taking Depakote for the past 2 nights.  He was "okay" until waking up this morning when pt became more confused. He developed nausea and vomiting.  Patient has vomited 3 times with nonbilious and nonbloody vomiting.  No diarrhea today.  Patient did not complain of abdominal pain to his wife.  Does not seem to have chest pain, cough, shortness breath.  No fever or chills.  ED Course: pt was found to have WBC 6.6, lipase 87, troponin level 8, 8, pending COVID-19 PCR, worsening renal function, liver function is okay except for total bilirubin 1.8 -->2.0, temperature normal, blood pressure 109/46, heart rate 79, RR 17, oxygen saturation 98%  on room air.  CT of the head is negative for acute intracranial abnormalities.  Patient is placed on MedSurg bed for observation  CT-head showed: 1. Stable mild age advanced cerebral atrophy, ventriculomegaly and periventricular white matter disease. 2. No acute intracranial findings or mass lesions.  Review of Systems: Could not be reviewed due to altered mental status  Allergy:  No Known Allergies  Past Medical History:  Diagnosis Date  . BPH (benign prostatic hyperplasia)   . Diabetes mellitus without complication ([place])   . Gout   . Hypercholesteremia   . Hypertension   . Skin cancer 2017   melanoa- removed at [facility]  . Thrombocytopenia ([place])     Past Surgical History:  Procedure Laterality Date  . CATARACT EXTRACTION W/PHACO Right 09/27/2015   Procedure: CATARACT EXTRACTION PHACO AND INTRAOCULAR LENS PLACEMENT (IOC);  Surgeon: Leandrew Koyanagi, MD;  Location: [facility];  Service: Ophthalmology;  Laterality: Right;  DIABETIC  . KNEE ARTHROSCOPY    . PACEMAKER IMPLANT N/A 12/24/2019   Procedure: PACEMAKER IMPLANT;  Surgeon: Marzetta Board, MD;  Location: [facility];  Service: Cardiovascular;  Laterality: N/A;    Social History:  reports that he has never smoked. He has never used smokeless tobacco. He reports that he does not drink alcohol and does not use drugs.  Family History:  Family History  Problem Relation Age of Onset  . Coronary artery disease Mother   . Polycythemia Mother   . Macular degeneration Mother   . Dementia Mother   . Coronary artery  disease Father      Prior to Admission medications   Medication Sig Start Date End Date Taking? Authorizing Provider  amLODipine (NORVASC) 5 MG tablet Take 1 tablet (5 mg total) by mouth daily. 12/26/19 01/25/20  Wyvonnia Dusky, MD  aspirin EC 81 MG tablet Take 81 mg by mouth daily.  02/25/18   [provider]  carvedilol (COREG) 6.25 MG tablet Take 1 tablet (6.25 mg total) by mouth 2  (two) times daily with a meal. 12/25/19 01/24/20  Wyvonnia Dusky, MD  Cholecalciferol (VITAMIN D3) 50 MCG (2000 UT) capsule Take 4,000 Units by mouth daily.    [provider]  clobetasol ointment (TEMOVATE) 3.64 % Apply 1 application topically 2 (two) times daily as needed (skin irritations).    [provider]  colchicine 0.6 MG tablet Take 0.5 tablets (0.3 mg total) by mouth every other day. 12/27/19 01/26/20  Wyvonnia Dusky, MD  dutasteride (AVODART) 0.5 MG capsule Take 0.5 mg by mouth every morning.    [provider]  febuxostat (ULORIC) 40 MG tablet Take 40 mg by mouth daily.  02/25/18   [provider]  hydrocortisone 2.5 % ointment Apply 1 application topically daily.  12/06/15   [provider]  ketoconazole (NIZORAL) 2 % cream Apply 1 application topically daily.  12/06/15   [provider]  lisinopril-hydrochlorothiazide (PRINZIDE,ZESTORETIC) 20-12.5 MG tablet Take 1 tablet by mouth every morning.    [provider]  magnesium oxide (MAG-OX) 400 MG tablet Take 400 mg by mouth 2 (two) times daily.    [provider]  Omega-3 Fatty Acids (FISH OIL) 1000 MG CAPS Take 1,000 mg by mouth daily.    [provider]  pravastatin (PRAVACHOL) 10 MG tablet Take 1 tablet (10 mg total) by mouth daily at 6 PM. 12/25/19 01/24/20  Wyvonnia Dusky, MD  QUEtiapine (SEROQUEL) 25 MG tablet Take 1 tablet (25 mg total) by mouth 2 (two) times daily. Take one at bedtime plus one daily as needed only for agitation 02/16/20   Clapacs, Madie Reno, MD  traZODone (DESYREL) 50 MG tablet Take 50 mg by mouth at bedtime. 12/01/19   [provider]    Physical Exam: Vitals:   02/19/20 1300 02/19/20 1330 02/19/20 1345 02/19/20 1600  BP: (!) 115/57   120/67  Pulse: 74 79 76 75  Resp: 12 (!) 21 20 (!) 21  Temp:      TempSrc:      SpO2: 98% 98% 97% 97%  Weight:      Height:       General: Not in acute distress HEENT:        Eyes: PERRL, EOMI, no scleral icterus.       ENT: No discharge from the ears and nose       Neck: No JVD, no bruit, no mass felt. Heme: No neck lymph node enlargement. Cardiac: S1/S2, RRR, No murmurs, No gallops or rubs. Respiratory: No rales, wheezing, rhonchi or rubs. GI: Soft, nondistended, nontender, no organomegaly, BS present. GU: No hematuria Ext: No pitting leg edema bilaterally. 1+DP/PT pulse bilaterally. Musculoskeletal: No joint deformities, No joint redness or warmth, no limitation of ROM in spin. Skin: No rashes.  Neuro: Confused, not following command, barely arousable, not oriented X3, cranial nerves II-XII grossly intact, moves all extremities. Psych: Patient is not psychotic, no suicidal or hemocidal ideation.  Labs on Admission: I have personally reviewed following labs and imaging studies  CBC: Recent Labs  Lab  02/14/20 2006 02/19/20 0817  WBC 9.7 6.6  NEUTROABS  --  4.5  HGB 13.4 12.0*  HCT 36.7* 32.4*  MCV 99.7 96.7  PLT 84* 59*   Basic Metabolic Panel: Recent Labs  Lab 02/14/20 2006 02/15/20 0245 02/15/20 0401 02/19/20 0817 02/19/20 1030  NA 139 138  --  138 138  K 5.3* 5.0  --  5.0 4.9  CL 107 111  --  104 105  CO2 25 20*  --  24 21*  GLUCOSE 135* 105*  --  177* 161*  BUN 44* 41*  --  71* 69*  CREATININE 1.80* 1.49*  --  2.54* 2.42*  CALCIUM 9.8 9.0  --  9.1 9.1  MG  --   --  1.9 2.3  --    GFR: Estimated Creatinine Clearance: 34.8 mL/min (A) (by C-G formula based on SCr of 2.42 mg/dL (H)). Liver Function Tests: Recent Labs  Lab 02/14/20 2006 02/19/20 0817 02/19/20 1030  AST 34 33 31  ALT 34 25 24  ALKPHOS 89 67 65  BILITOT 2.2* 1.8* 2.0*  PROT 6.3* 5.4* 5.3*  ALBUMIN 3.6 3.1* 3.0*   Recent Labs  Lab 02/19/20 0817  LIPASE 87*   No results for input(s): AMMONIA in the last 168 hours. Coagulation Profile: No results for input(s): INR, PROTIME in the last 168 hours. Cardiac Enzymes: No results for input(s): CKTOTAL, CKMB,  CKMBINDEX, TROPONINI in the last 168 hours. BNP (last 3 results) No results for input(s): PROBNP in the last 8760 hours. HbA1C: No results for input(s): HGBA1C in the last 72 hours. CBG: Recent Labs  Lab 02/14/20 2005  GLUCAP 116*   Lipid Profile: No results for input(s): CHOL, HDL, LDLCALC, TRIG, CHOLHDL, LDLDIRECT in the last 72 hours. Thyroid Function Tests: No results for input(s): TSH, T4TOTAL, FREET4, T3FREE, THYROIDAB in the last 72 hours. Anemia Panel: No results for input(s): VITAMINB12, FOLATE, FERRITIN, TIBC, IRON, RETICCTPCT in the last 72 hours. Urine analysis:    Component Value Date/Time   COLORURINE STRAW (A) 02/19/2020 1206   APPEARANCEUR CLEAR (A) 02/19/2020 1206   APPEARANCEUR Clear 01/20/2019 1106   LABSPEC 1.008 02/19/2020 1206   PHURINE 7.0 02/19/2020 1206   GLUCOSEU NEGATIVE 02/19/2020 1206   HGBUR NEGATIVE 02/19/2020 1206   [place] 02/19/2020 1206   BILIRUBINUR Negative 01/20/2019 1106   KETONESUR NEGATIVE 02/19/2020 1206   PROTEINUR NEGATIVE 02/19/2020 1206   NITRITE NEGATIVE 02/19/2020 1206   LEUKOCYTESUR NEGATIVE 02/19/2020 1206   Sepsis Labs: @LABRCNTIP (procalcitonin:4,lacticidven:4) ) Recent Results (from the past 240 hour(s))  SARS CORONAVIRUS 2 (TAT 6-24 HRS) Nasopharyngeal Nasopharyngeal Swab     Status: None   Collection Time: 02/15/20  1:01 AM   Specimen: Nasopharyngeal Swab  Result Value Ref Range Status   SARS Coronavirus 2 NEGATIVE NEGATIVE Final    Comment: (NOTE) SARS-CoV-2 target nucleic acids are NOT DETECTED.  The SARS-CoV-2 RNA is generally detectable in upper and lower respiratory specimens during the acute phase of infection. Negative results do not preclude SARS-CoV-2 infection, do not rule out co-infections with other pathogens, and should not be used as the sole basis for treatment or other patient management decisions. Negative results must be combined with clinical observations, patient history, and  epidemiological information. The expected result is Negative.  Fact Sheet for Patients: SugarRoll.be  Fact Sheet for Healthcare Providers: https://www.woods-mathews.com/  This test is not yet approved or cleared by the [place] FDA and  has been authorized for detection and/or diagnosis of SARS-CoV-2 by  FDA under an Emergency Use Authorization (EUA). This EUA will remain  in effect (meaning this test can be used) for the duration of the COVID-19 declaration under Se ction 564(b)(1) of the Act, 21 U.S.C. section 360bbb-3(b)(1), unless the authorization is terminated or revoked sooner.  Performed at [facility], [place] [address] [place], [place] [address]      Radiological Exams on Admission: CT ABDOMEN PELVIS WO CONTRAST  Result Date: 02/19/2020 CLINICAL DATA:  Altered mental status.  Vomiting. EXAM: CT ABDOMEN AND PELVIS WITHOUT CONTRAST TECHNIQUE: Multidetector CT imaging of the abdomen and pelvis was performed following the standard protocol without IV contrast. COMPARISON:  Renal ultrasound dated November 19, 2018. FINDINGS: Lower chest: No acute abnormality. Minimal dependent subsegmental atelectasis in both lower lobes. Bilateral gynecomastia. Hepatobiliary: Slightly nodular liver contour. No focal liver abnormality. Multiple small layering gallstones. No gallbladder wall thickening. Dilated proximal common bile duct measuring up to 17 mm, with normal tapering at the ampulla. Pancreas: Unremarkable. No pancreatic ductal dilatation or surrounding inflammatory changes. Spleen: Mildly enlarged.  No focal abnormality. Adrenals/Urinary Tract: The adrenal glands and left kidney are unremarkable. Punctate calculus in the lower pole of the right kidney. No hydronephrosis. The bladder is unremarkable. Stomach/Bowel: Stomach is within normal limits. Appendix appears normal. No evidence of bowel wall thickening, distention, or inflammatory  changes. Vascular/Lymphatic: Aortic atherosclerosis. No enlarged abdominal or pelvic lymph nodes. Reproductive: Prostatomegaly with median lobe hypertrophy indenting the bladder base. Other: No abdominal wall hernia or abnormality. No abdominopelvic ascites. No pneumoperitoneum. Musculoskeletal: No acute or significant osseous findings. IMPRESSION: 1. Dilated proximal common bile duct measuring up to 17 mm, with normal tapering at the ampulla. No obstructing lesion or gallstone identified. Correlate with LFTs and consider further evaluation with ERCP or MRCP as clinically indicated. 2. Cholelithiasis. 3. 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 4. Punctate right nephrolithiasis. 5. Aortic Atherosclerosis (ICD10-I70.0). Electronically Signed   By: Titus Dubin M.D.   On: 02/19/2020 13:43   CT Head Wo Contrast  Result Date: 02/19/2020 CLINICAL DATA:  Vomiting altered mental status. EXAM: CT HEAD WITHOUT CONTRAST TECHNIQUE: Contiguous axial images were obtained from the base of the skull through the vertex without intravenous contrast. COMPARISON:  02/14/2020 FINDINGS: Brain: Stable mild age advanced cerebral atrophy, ventriculomegaly and periventricular white matter disease. No extra-axial fluid collections are identified. No CT findings for acute hemispheric infarction or intracranial hemorrhage. No mass lesions. The brainstem and cerebellum are normal. Vascular: Stable vascular calcifications. No aneurysm or hyperdense vessels. Skull: Fell no skull fracture or bone lesions. Sinuses/Orbits: Size the paranasal sinuses and mastoid air cells are clear. The globes are intact. Other: Other no scalp lesions or scalp hematoma. IMPRESSION: 1. Stable mild age advanced cerebral atrophy, ventriculomegaly and periventricular white matter disease. 2. No acute intracranial findings or mass lesions. Electronically Signed   By: Marijo Sanes M.D.   On: 02/19/2020 08:56     EKG: I have  personally reviewed.  Paced rhythm  Assessment/Plan Principal Problem:   Acute metabolic encephalopathy Active Problems:   Benign prostatic hyperplasia without lower urinary tract symptoms   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 tophus   Essential hypertension   Hypercholesterolemia   Thrombocytopenia (HCC)   Type II diabetes mellitus with renal manifestations (HCC)   Nausea & vomiting   Acute renal failure superimposed on stage 3a chronic kidney disease (HCC)   Chronic diastolic CHF (congestive heart failure) (HCC)   Pressure injury of skin   Acute metabolic encephalopathy: Etiology  is not clear.  CT head did not show acute intracranial abnormalities today.  Patient has possible dementia. Patient is followed up by neurologist who is working up for etiology.  Acute mental status change may be due to the side effects of new medications, including Seroquel and Depakote  -place in med-surg bed for obs -Frequent neuro check -Hold Depakote and Seroquel -Check urinalysis -Cannot do MRI of brain due to presence of pacemaker -hold all oral meds until mental status improves -Keep pt NPO until mental status improves -check ammonia level  Benign prostatic hyperplasia without lower urinary tract symptoms -Hold dutasteride  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 tophus -Hold Uloric  Essential hypertension -Hold lisinopril-HCTZ and Coreg -IV hydralazine as needed  Hypercholesterolemia: Patient used to take pravastatin, currently not taking these medications -Follow-up with PCP  Thrombocytopenia [facility]): Patient has history of thrombocytopenia.  Unclear etiology.  Platelet 59 today, no bleeding tendency -Follow-up with CBC  Diet controlled Type II diabetes mellitus with renal manifestations ([place]): Blood sugar 161.  Recent A1c 5.0, well controlled.  Patient's not taking medications --No treatment needed  Nausea & vomiting: Etiology is not clear.  Lipase 87.  CT abdomen/pelvis showed Dilated proximal common bile duct  measuring up to 17 mm, with normal tapering at the ampulla. No obstructing lesion or gallstone identified.  Liver function with ALP 67, ALT 33, ALT 25, total bilirubin 1.8 -->2.0. -As needed Zofran for nausea -IV fluid: 1 L LR, and 75 cc/h -will repeat LFT in AM -->if bilirubin is trending up, may consider GI consult for possible ERCP. Pt has pacemaker, cannot do MRCP.  Acute renal failure superimposed on stage 3a chronic kidney disease ([place]): Baseline creatinine 1.2-1.4.  His creatinine is 2.54, BUN 71.  Likely due to dehydration and continuation of Prinzide.  CT scan of abdomen did not show hydronephrosis. -IV fluid as above -Hold Prinzide  Chronic diastolic CHF (congestive heart failure) ([place]): 2D echo on 12/24/2019 showed EF 65 to 70% with grade 1 diastolic dysfunction.  Patient does not have leg edema.  No respiratory distress.  CHF seem to be compensated. -Check BNP          DVT ppx: SCD Code Status: DNR per his wife Family Communication:  Yes, patient's wife by phone Disposition Plan:  Anticipate discharge back to previous environment Consults called:  none Admission status and Level of care: Med-Surg:    Med-surg bed for obs  Status is: Observation  The patient remains OBS appropriate and will d/c before 2 midnights.  Dispo: The patient is from: Home              Anticipated d/c is to: Home              Anticipated d/c date is: 1 day              Patient currently is not medically stable to d/c.   Difficult to place patient No            Date of Service 02/19/2020    Ivor Costa [facility]   If 7PM-7AM, please contact night-coverage [email or web address] 02/19/2020, 6:59 PM

## 2020-02-19 NOTE — ED Notes (Signed)
Wife at bedside.

## 2020-02-19 NOTE — TOC Progression Note (Signed)
Transition of Care [facility]) - Progression Note    Patient Details  Name: Dean Lynch MRN: [id number] Date of Birth: [birth year]  [facility] of [facility]) CM/SW [place], LCSW Phone Number: 02/19/2020, 11:20 AM  Clinical Narrative:    CSW met with the patient's family over the telephone. Social worker explained to the family reason for the consult.  CSW explain HIPPA.     Patient is a 70 year old Caucasian male who [facility] to [facility] ED via EMS from home with complaints of vomiting and AMS. Patient presents with history of newly diagnosed dementia, and history of hypertension, diabetes, hyperlipidemia, CKD, and second-degree heart block status post pacemaker placement. Patient has had several emergency room visit. Most recently discharged 02/16/2020.   CSW discussed with the patient's wife that the patient qualifies for 14 days stay at a (HTA) skills nursing facility upon discharge.   CSW educated patient on the process of searching for skills nursing facility. Provided patient with list of Medicare certified nursing home.   Plan: Wife would like to wait before making any decision.     Expected Discharge Plan: Skilled Nursing Facility Barriers to Discharge: Continued Medical Work up  Expected Discharge Plan and Services Expected Discharge Plan: [place]        Social Determinants of Health (SDOH) Interventions    Readmission Risk Interventions No flowsheet data found.

## 2020-02-19 NOTE — ED Notes (Signed)
Social work Nature conservation officer and coming to speak to pt's wife

## 2020-02-19 NOTE — ED Notes (Signed)
Per secure chat message by Raiford Noble, RN pt can come to floor now.

## 2020-02-19 NOTE — ED Notes (Signed)
Pt taken to CT.

## 2020-02-19 NOTE — ED Notes (Signed)
Respiratory at bedside to suction pt 

## 2020-02-19 NOTE — Progress Notes (Signed)
Pt NT sxn for copious amounts of thin/thick clear/white secretions. Pt tol well with a desat w/a quick recovery

## 2020-02-19 NOTE — ED Notes (Signed)
Patient suctioned at this time per Blaine Hamper, MD request.

## 2020-02-19 NOTE — ED Provider Notes (Signed)
[facility] Emergency Department Provider Note ____________________________________________   Event Date/Time   First MD Initiated Contact with Patient 02/19/20 [phone]     (approximate)  I have reviewed the triage vital signs and the nursing notes.  HISTORY  Chief Complaint Emesis and Altered Mental Status   HPI Dean Lynch is a 70 y.o. malewho presents to the ED for evaluation of emesis and altered mentation.  Chart review indicates history of HTN, HLD, DM and gout. Recent ED in psychiatric observation this past week due to concerns for acute agitation and dementia. Ultimately discharged home to the care of his wife with the addition of Seroquel to his regimen.  Patient presents to the ED via EMS for confusion and emesis.  Patient is unable to provide any relevant history due to his confusion and somnolence.  History somewhat limited due to this.  Majority of history is provided by EMS and patient's wife, who I speak with over the phone.  She reports that he was recently started on Seroquel this past week, but has not taken it in the past 36 hours.  Further reports being started on Depakote by neurology, taking it for the past 2 nights.  He was "okay" until waking up this morning acutely altered and recurrently vomiting.  She could not get him to wake up or answer questions appropriately, so she called 911 for assistance.  Past Medical History:  Diagnosis Date  . BPH (benign prostatic hyperplasia)   . Diabetes mellitus without complication ([place])   . Gout   . Hypercholesteremia   . Hypertension   . Skin cancer 2017   melanoa- [facility] at [facility]  . Thrombocytopenia [facility])     Patient Active Problem List   Diagnosis Date Noted  . Agitation 02/15/2020  . Bradycardia 12/23/2019  . Acute CHF (congestive heart failure) ([place]) 12/23/2019  . Thrombocytopenia ([place]) 07/18/2016  . Benign prostatic hyperplasia without lower urinary tract symptoms 07/16/2016  .  History of melanoma 12/06/2015  . Diabetes mellitus type 2, uncomplicated ([place]) 43/15/4008  . 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 tophus 12/15/2013  . Essential hypertension 12/15/2013  . Hypercholesterolemia 12/15/2013    Past Surgical History:  Procedure Laterality Date  . CATARACT EXTRACTION W/PHACO Right 09/27/2015   Procedure: CATARACT EXTRACTION PHACO AND INTRAOCULAR LENS PLACEMENT (IOC);  Surgeon: Leandrew Koyanagi, MD;  Location: [place];  Service: Ophthalmology;  Laterality: Right;  DIABETIC  . KNEE ARTHROSCOPY    . PACEMAKER IMPLANT N/A 12/24/2019   Procedure: PACEMAKER IMPLANT;  Surgeon: Marzetta Board, MD;  Location: [place] CV LAB;  Service: Cardiovascular;  Laterality: N/A;    Prior to Admission medications   Medication Sig Start Date End Date Taking? Authorizing Provider  amLODipine (NORVASC) 5 MG tablet Take 1 tablet (5 mg total) by mouth daily. 12/26/19 01/25/20  Wyvonnia Dusky, MD  aspirin EC 81 MG tablet Take 81 mg by mouth daily.  02/25/18   [provider]  carvedilol (COREG) 6.25 MG tablet Take 1 tablet (6.25 mg total) by mouth 2 (two) times daily with a meal. 12/25/19 01/24/20  Wyvonnia Dusky, MD  Cholecalciferol (VITAMIN D3) 50 MCG (2000 UT) capsule Take 4,000 Units by mouth daily.    [provider]  clobetasol ointment (TEMOVATE) 6.76 % Apply 1 application topically 2 (two) times daily as needed (skin irritations).    [provider]  colchicine 0.6 MG tablet Take 0.5 tablets (0.3 mg total) by mouth every other day. 12/27/19 01/26/20  Wyvonnia Dusky, MD  dutasteride (AVODART) 0.5 MG capsule Take 0.5 mg by mouth every morning.    [provider]  febuxostat (ULORIC) 40 MG tablet Take 40 mg by mouth daily.  02/25/18   [provider]  hydrocortisone 2.5 % ointment Apply 1 application topically daily.  12/06/15   [provider]  ketoconazole (NIZORAL) 2 % cream Apply 1 application topically daily.   12/06/15   [provider]  lisinopril-hydrochlorothiazide (PRINZIDE,ZESTORETIC) 20-12.5 MG tablet Take 1 tablet by mouth every morning.    [provider]  magnesium oxide (MAG-OX) 400 MG tablet Take 400 mg by mouth 2 (two) times daily.    [provider]  Omega-3 Fatty Acids (FISH OIL) 1000 MG CAPS Take 1,000 mg by mouth daily.    [provider]  pravastatin (PRAVACHOL) 10 MG tablet Take 1 tablet (10 mg total) by mouth daily at 6 PM. 12/25/19 01/24/20  Wyvonnia Dusky, MD  QUEtiapine (SEROQUEL) 25 MG tablet Take 1 tablet (25 mg total) by mouth 2 (two) times daily. Take one at bedtime plus one daily as needed only for agitation 02/16/20   Clapacs, Madie Reno, MD  traZODone (DESYREL) 50 MG tablet Take 50 mg by mouth at bedtime. 12/01/19   [provider]    Allergies Fenofibrate micronized  Family History  Problem Relation Age of Onset  . Coronary artery disease Mother   . Polycythemia Mother   . Macular degeneration Mother   . Dementia Mother   . Coronary artery disease Father     Social History Social History   Tobacco Use  . Smoking status: Never Smoker  . Smokeless tobacco: Never Used  Vaping Use  . Vaping Use: Never used  Substance Use Topics  . Alcohol use: No  . Drug use: No    Review of Systems  Unable to be assessed due to patient's altered mentation. ____________________________________________   PHYSICAL EXAM:  VITAL SIGNS: Vitals:   02/19/20 1045 02/19/20 1100  BP:  (!) 109/46  Pulse: 77 79  Resp: 14 17  Temp:    SpO2: 98% 98%     Constitutional: Somnolent.  Awakens transiently to loud verbal or noxious stimulation.  Follows commands in all 4 extremities without apparent deficit.  Does not open eyes for more than a few seconds.  Eyes: Conjunctivae are normal. PERRL. EOMI. Head: Atraumatic. Nose: No congestion/rhinnorhea. Mouth/Throat: Mucous membranes are dry.  Oropharynx non-erythematous. Neck: No  stridor. No cervical spine tenderness to palpation. Cardiovascular: Normal rate, regular rhythm. Grossly normal heart sounds.  Good peripheral circulation. Respiratory: Normal respiratory effort.  No retractions. Lungs CTAB. Gastrointestinal: Soft , nondistended, nontender to palpation. No CVA tenderness.  Benign and soft abdomen throughout. Musculoskeletal: No lower extremity tenderness nor edema.  No joint effusions. No signs of acute trauma. Neurologic:   No gross focal neurologic deficits are appreciated.  Follows commands in all 4. Skin:  Skin is warm, dry and intact. No rash noted. Psychiatric: Mood and affect are difficult to assess due to mental status  ____________________________________________   LABS (all labs ordered are listed, but only abnormal results are displayed)  Labs Reviewed  COMPREHENSIVE METABOLIC PANEL - Abnormal; Notable for the following components:      Result Value   Glucose, Bld 177 (*)    BUN 71 (*)    Creatinine, Ser 2.54 (*)    Total Protein 5.4 (*)    Albumin 3.1 (*)    Total Bilirubin 1.8 (*)  GFR, Estimated 27 (*)    All other components within normal limits  CBC WITH DIFFERENTIAL/PLATELET - Abnormal; Notable for the following components:   RBC 3.35 (*)    Hemoglobin 12.0 (*)    HCT 32.4 (*)    MCH 35.8 (*)    MCHC 37.0 (*)    Platelets 59 (*)    All other components within normal limits  LIPASE, BLOOD - Abnormal; Notable for the following components:   Lipase 87 (*)    All other components within normal limits  COMPREHENSIVE METABOLIC PANEL - Abnormal; Notable for the following components:   CO2 21 (*)    Glucose, Bld 161 (*)    BUN 69 (*)    Creatinine, Ser 2.42 (*)    Total Protein 5.3 (*)    Albumin 3.0 (*)    Total Bilirubin 2.0 (*)    GFR, Estimated 28 (*)    All other components within normal limits  SARS CORONAVIRUS 2 (TAT 6-24 HRS)  MAGNESIUM  TROPONIN I (HIGH SENSITIVITY)  TROPONIN I (HIGH SENSITIVITY)    ____________________________________________  12 Lead EKG  Paced rhythm, rate of 87 bpm.  Normal axis and intervals.  No evidence of acute ischemia per Sgarbossa criteria ____________________________________________  RADIOLOGY  ED MD interpretation:   CT head reviewed by me without evidence of acute intracranial pathology.  Official radiology report(s): CT Head Wo Contrast  Result Date: 02/19/2020 CLINICAL DATA:  Vomiting altered mental status. EXAM: CT HEAD WITHOUT CONTRAST TECHNIQUE: Contiguous axial images were obtained from the base of the skull through the vertex without intravenous contrast. COMPARISON:  02/14/2020 FINDINGS: Brain: Stable mild age advanced cerebral atrophy, ventriculomegaly and periventricular white matter disease. No extra-axial fluid collections are identified. No CT findings for acute hemispheric infarction or intracranial hemorrhage. No mass lesions. The brainstem and cerebellum are normal. Vascular: Stable vascular calcifications. No aneurysm or hyperdense vessels. Skull: Fell no skull fracture or bone lesions. Sinuses/Orbits: Size the paranasal sinuses and mastoid air cells are clear. The globes are intact. Other: Other no scalp lesions or scalp hematoma. IMPRESSION: 1. Stable mild age advanced cerebral atrophy, ventriculomegaly and periventricular white matter disease. 2. No acute intracranial findings or mass lesions. Electronically Signed   By: Marijo Sanes M.D.   On: 02/19/2020 08:56    ____________________________________________   PROCEDURES and INTERVENTIONS  Procedure(s) performed (including Critical Care):  .1-3 Lead EKG Interpretation Performed by: Vladimir Crofts, MD Authorized by: Vladimir Crofts, MD     Interpretation: normal     ECG rate:  80   ECG rate assessment: normal     Rhythm: paced     Ectopy: none     Conduction: normal      Medications  lactated ringers bolus 1,000 mL (1,000 mLs Intravenous New Bag/Given 02/19/20 0832)   ondansetron (ZOFRAN) injection 4 mg (4 mg Intravenous Given 02/19/20 0931)    ____________________________________________   MDM / ED COURSE   70 year old male recently started on multiple medications this week presents acutely altered with recurrent emesis, with signs of dehydration and AKI, requiring medical observation admission.  Normal vital signs on room air.  Exam with stigmata of dehydration and a nonfocal neurologic exam.  He is somnolent, awakens briefly to noxious and loud vocal stimulations, follows commands in all 4 extremities, but immediately falls back asleep.  No evidence of trauma or neurovascular deficits.  His abdomen is benign without apparent discomfort with deep palpation and so abdominal imaging was not pursued in the ED.  Blood work with stigmata of dehydration and AKI, for which she received a liter of LR.  Repeat metabolic panel with continued signs of AKI and dehydration.  His mental status is not improving over 3.5-hour observation period in the ED.  I am most concerned about polypharmacy with a recent medication additions to his regimen, his nonfocal exam and work-up most consistent with dehydration and AKI.  We will discussed the case with hospitalist medicine for further work-up and management with observation medical admission.  Clinical Course as of 02/19/20 1140  Sat Feb 19, 2020  0818 Called wife, Dean Lynch,  She woke up, he was still, sleeping, just started vomiting recurrently. He wouldn't wake up. He'd briefly open his eyes, act confused.  Prescribed Depakote recently by Dr. Manuella Ghazi, taking nighttime only, Taken 2 doses total, last night and night prior.   Seroquel, one dose Wednesday when left the ED last, none since then.  [DS]  1062 IRSWNIOEVO.  Continues to be obtunded.  Repeat metabolic panel hardly improved.  We will discussed the case with hospitalist for observation admission. [DS]    Clinical Course User Index [DS] Vladimir Crofts, MD     ____________________________________________   FINAL CLINICAL IMPRESSION(S) / ED DIAGNOSES  Final diagnoses:  Dehydration  Somnolence  AKI (acute kidney injury) [facility])  Polypharmacy     ED Discharge Orders    None       Naava Janeway   Note:  This document was prepared using Dragon voice recognition software and may include unintentional dictation errors.   Vladimir Crofts, MD 02/19/20 [phone]

## 2020-02-19 NOTE — ED Notes (Signed)
Wife not at bedside, pt remains dry and resting at this time. IV wrapped with armboard and mitten placed on opposite hand.

## 2020-02-19 NOTE — ED Notes (Signed)
Pt has reddened area on bottom. Mepilex applied.

## 2020-02-19 NOTE — Progress Notes (Signed)
Pt NT sxn by student RN with RT present for copious amounts of thin/thick clear/white secretions. Pt tol well with a desat w/a quick recovery

## 2020-02-19 NOTE — ED Triage Notes (Addendum)
Pt comes EMS from home after new vomiting and AMS at home. Pt was recently here and dx with new dementia. Wife did not want SNF upon last admission. CBG normal with EMS. Pt arousable to sternal rub. Hard to follow commands,

## 2020-02-19 NOTE — Social Work (Addendum)
TOC CM/SW is following this patient.   10:30 AM CSW spoke to the patient's spouse, Adonis Huguenin who reported that she would like to wait before deciding what to do.  Does not want CSW to start paper work.    10:00 AM CSW left message for Roxanne Orner, spouse, [phone] and [phone].   09:00 AM Text message received. HTA will approve patient for 14 days  SNF should the family (spouse) desire to SNF.

## 2020-02-20 DIAGNOSIS — L89301 Pressure ulcer of unspecified buttock, stage 1: Secondary | ICD-10-CM | POA: Diagnosis present

## 2020-02-20 DIAGNOSIS — E7800 Pure hypercholesterolemia, unspecified: Secondary | ICD-10-CM | POA: Diagnosis present

## 2020-02-20 DIAGNOSIS — N1831 Chronic kidney disease, stage 3a: Secondary | ICD-10-CM | POA: Diagnosis present

## 2020-02-20 DIAGNOSIS — F0390 Unspecified dementia without behavioral disturbance: Secondary | ICD-10-CM | POA: Diagnosis present

## 2020-02-20 DIAGNOSIS — E860 Dehydration: Principal | ICD-10-CM

## 2020-02-20 DIAGNOSIS — D696 Thrombocytopenia, unspecified: Secondary | ICD-10-CM | POA: Diagnosis present

## 2020-02-20 DIAGNOSIS — Z66 Do not resuscitate: Secondary | ICD-10-CM | POA: Diagnosis present

## 2020-02-20 DIAGNOSIS — Z961 Presence of intraocular lens: Secondary | ICD-10-CM | POA: Diagnosis present

## 2020-02-20 DIAGNOSIS — I130 Hypertensive heart and chronic kidney disease with heart failure and stage 1 through stage 4 chronic kidney disease, or unspecified chronic kidney disease: Secondary | ICD-10-CM | POA: Diagnosis present

## 2020-02-20 DIAGNOSIS — E785 Hyperlipidemia, unspecified: Secondary | ICD-10-CM | POA: Diagnosis present

## 2020-02-20 DIAGNOSIS — M1A9XX0 Chronic gout, unspecified, without tophus (tophi): Secondary | ICD-10-CM | POA: Diagnosis present

## 2020-02-20 DIAGNOSIS — Z950 Presence of cardiac pacemaker: Secondary | ICD-10-CM | POA: Diagnosis not present

## 2020-02-20 DIAGNOSIS — E1122 Type 2 diabetes mellitus with diabetic chronic kidney disease: Secondary | ICD-10-CM | POA: Diagnosis present

## 2020-02-20 DIAGNOSIS — R112 Nausea with vomiting, unspecified: Secondary | ICD-10-CM | POA: Diagnosis present

## 2020-02-20 DIAGNOSIS — Z79899 Other long term (current) drug therapy: Secondary | ICD-10-CM | POA: Diagnosis not present

## 2020-02-20 DIAGNOSIS — I5032 Chronic diastolic (congestive) heart failure: Secondary | ICD-10-CM | POA: Diagnosis present

## 2020-02-20 DIAGNOSIS — Z85820 Personal history of malignant melanoma of skin: Secondary | ICD-10-CM | POA: Diagnosis not present

## 2020-02-20 DIAGNOSIS — G9341 Metabolic encephalopathy: Secondary | ICD-10-CM | POA: Diagnosis present

## 2020-02-20 DIAGNOSIS — R531 Weakness: Secondary | ICD-10-CM | POA: Diagnosis not present

## 2020-02-20 DIAGNOSIS — Z8249 Family history of ischemic heart disease and other diseases of the circulatory system: Secondary | ICD-10-CM | POA: Diagnosis not present

## 2020-02-20 DIAGNOSIS — Z9841 Cataract extraction status, right eye: Secondary | ICD-10-CM | POA: Diagnosis not present

## 2020-02-20 DIAGNOSIS — N179 Acute kidney failure, unspecified: Secondary | ICD-10-CM | POA: Diagnosis present

## 2020-02-20 DIAGNOSIS — N400 Enlarged prostate without lower urinary tract symptoms: Secondary | ICD-10-CM | POA: Diagnosis present

## 2020-02-20 DIAGNOSIS — Z20822 Contact with and (suspected) exposure to covid-19: Secondary | ICD-10-CM | POA: Diagnosis present

## 2020-02-20 DIAGNOSIS — Z7982 Long term (current) use of aspirin: Secondary | ICD-10-CM | POA: Diagnosis not present

## 2020-02-20 LAB — BASIC METABOLIC PANEL
Anion gap: 8 (ref 5–15)
BUN: 56 mg/dL — ABNORMAL HIGH (ref 8–23)
CO2: 25 mmol/L (ref 22–32)
Calcium: 9.3 mg/dL (ref 8.9–10.3)
Chloride: 109 mmol/L (ref 98–111)
Creatinine, Ser: 1.84 mg/dL — ABNORMAL HIGH (ref 0.61–1.24)
GFR, Estimated: 39 mL/min — ABNORMAL LOW (ref >60)
Glucose, Bld: 116 mg/dL — ABNORMAL HIGH (ref 70–99)
Potassium: 4.1 mmol/L (ref 3.5–5.1)
Sodium: 142 mmol/L (ref 135–145)

## 2020-02-20 LAB — CBC
HCT: 32.4 % — ABNORMAL LOW (ref 39.0–52.0)
Hemoglobin: 11.8 g/dL — ABNORMAL LOW (ref 13.0–17.0)
MCH: 35.8 pg — ABNORMAL HIGH (ref 26.0–34.0)
MCHC: 36.4 g/dL — ABNORMAL HIGH (ref 30.0–36.0)
MCV: 98.2 fL (ref 80.0–100.0)
Platelets: 69 10*3/uL — ABNORMAL LOW (ref 150–400)
RBC: 3.3 MIL/uL — ABNORMAL LOW (ref 4.22–5.81)
RDW: 12.5 % (ref 11.5–15.5)
WBC: 7.8 10*3/uL (ref 4.0–10.5)
nRBC: 0 % (ref 0.0–0.2)

## 2020-02-20 LAB — HEPATIC FUNCTION PANEL
ALT: 26 U/L (ref 0–44)
AST: 30 U/L (ref 15–41)
Albumin: 3.1 g/dL — ABNORMAL LOW (ref 3.5–5.0)
Alkaline Phosphatase: 55 U/L (ref 38–126)
Bilirubin, Direct: 0.6 mg/dL — ABNORMAL HIGH (ref 0.0–0.2)
Indirect Bilirubin: 1.9 mg/dL — ABNORMAL HIGH (ref 0.3–0.9)
Total Bilirubin: 2.5 mg/dL — ABNORMAL HIGH (ref 0.3–1.2)
Total Protein: 5.3 g/dL — ABNORMAL LOW (ref 6.5–8.1)

## 2020-02-20 LAB — SARS CORONAVIRUS 2 (TAT 6-24 HRS): SARS Coronavirus 2: NEGATIVE

## 2020-02-20 MED ORDER — TRAZODONE HCL 50 MG PO TABS: 50.0000 mg | ORAL_TABLET | Freq: Every evening | ORAL | Status: DC | PRN

## 2020-02-20 MED ORDER — CARVEDILOL 3.125 MG PO TABS
6.2500 mg | ORAL_TABLET | Freq: Two times a day (BID) | ORAL | Status: DC
  Administered 2020-02-20 – 2020-02-21 (×2): 6.25 mg via ORAL
  Filled 2020-02-20 (×2): qty 2

## 2020-02-20 MED ORDER — ASPIRIN EC 81 MG PO TBEC
81.0000 mg | DELAYED_RELEASE_TABLET | Freq: Every day | ORAL | Status: DC
  Administered 2020-02-20 – 2020-02-21 (×2): 81 mg via ORAL
  Filled 2020-02-20 (×2): qty 1

## 2020-02-20 MED ORDER — OMEGA-3-ACID ETHYL ESTERS 1 G PO CAPS
1.0000 g | ORAL_CAPSULE | Freq: Every day | ORAL | Status: DC
  Administered 2020-02-21: 1 g via ORAL
  Filled 2020-02-20: qty 1

## 2020-02-20 MED ORDER — MAGNESIUM OXIDE 400 (241.3 MG) MG PO TABS
400.0000 mg | ORAL_TABLET | Freq: Two times a day (BID) | ORAL | Status: DC
  Administered 2020-02-20 – 2020-02-21 (×2): 400 mg via ORAL
  Filled 2020-02-20 (×3): qty 1

## 2020-02-20 MED ORDER — AMLODIPINE BESYLATE 5 MG PO TABS
5.0000 mg | ORAL_TABLET | Freq: Every day | ORAL | Status: DC
  Administered 2020-02-20 – 2020-02-21 (×2): 5 mg via ORAL
  Filled 2020-02-20 (×2): qty 1

## 2020-02-20 MED ORDER — FISH OIL 1000 MG PO CAPS
1000.0000 mg | ORAL_CAPSULE | Freq: Every day | ORAL | Status: DC
Start: 2020-02-20 — End: 2020-02-20

## 2020-02-20 MED ORDER — DUTASTERIDE 0.5 MG PO CAPS
0.5000 mg | ORAL_CAPSULE | Freq: Every morning | ORAL | Status: DC
  Administered 2020-02-20: 0.5 mg via ORAL
  Filled 2020-02-20 (×2): qty 1

## 2020-02-20 MED ORDER — FEBUXOSTAT 40 MG PO TABS
40.0000 mg | ORAL_TABLET | Freq: Every day | ORAL | Status: DC
  Administered 2020-02-21: 40 mg via ORAL
  Filled 2020-02-20: qty 1

## 2020-02-20 MED ORDER — DIVALPROEX SODIUM 125 MG PO DR TAB
125.0000 mg | DELAYED_RELEASE_TABLET | Freq: Every day | ORAL | Status: DC
  Administered 2020-02-20: 125 mg via ORAL
  Filled 2020-02-20 (×2): qty 1

## 2020-02-20 MED ORDER — VITAMIN D3 25 MCG (1000 UNIT) PO TABS
2000.0000 [IU] | ORAL_TABLET | Freq: Two times a day (BID) | ORAL | Status: DC
  Administered 2020-02-20 – 2020-02-21 (×3): 2000 [IU] via ORAL
  Filled 2020-02-20 (×5): qty 2

## 2020-02-20 NOTE — Progress Notes (Signed)
Met with wife sitting outside of room. She is very emotionally fragile. She and patient will have their anniversary next month. She shared he may not be here to celebrate it. She also shared he is all she has in this world. Wife shared very tearfully throughout our time together. Spoke with doctor to consult with social worker to see if there are any type of supportive groups or agencies that can be available to wife going forward.

## 2020-02-20 NOTE — Progress Notes (Signed)
[place] [facility] [place] NAME: Dean Lynch    MR#:  [id number]  DATE OF BIRTH:  1950/02/17  SUBJECTIVE:   Patient awake and alert answered some of the questions appropriately disoriented to time place. Wife in the room.  No vomiting. Tolerating fluids. REVIEW OF SYSTEMS:   Review of Systems  Constitutional: Negative for chills, fever and weight loss.  HENT: Negative for ear discharge, ear pain and nosebleeds.   Eyes: Negative for blurred vision, pain and discharge.  Respiratory: Negative for sputum production, shortness of breath, wheezing and stridor.   Cardiovascular: Negative for chest pain, palpitations, orthopnea and PND.  Gastrointestinal: Negative for abdominal pain, diarrhea, nausea and vomiting.  Genitourinary: Negative for frequency and urgency.  Musculoskeletal: Negative for back pain and joint pain.  Neurological: Positive for weakness. Negative for sensory change, speech change and focal weakness.  Psychiatric/Behavioral: Negative for depression and hallucinations. The patient is not nervous/anxious.    Tolerating Diet:yes Tolerating PT:   DRUG ALLERGIES:  No Known Allergies  VITALS:  Blood pressure (!) 148/71, pulse 84, temperature 98.6 F (37 C), resp. rate 17, height 5\' 10"  (1.778 m), weight 101 kg, SpO2 100 %.  PHYSICAL EXAMINATION:   Physical Exam  GENERAL:  70 y.o.-year-old patient lying in the bed with no acute distress.  LUNGS: Normal breath sounds bilaterally, no wheezing, rales, rhonchi. No use of accessory muscles of respiration.  CARDIOVASCULAR: S1, S2 normal. No murmurs, rubs, or gallops.  ABDOMEN: Soft, nontender, nondistended. Bowel sounds present. No organomegaly or mass.  EXTREMITIES: No cyanosis, clubbing or edema b/l.    NEUROLOGIC: Cranial nerves II through XII are intact. No focal Motor or sensory deficits b/l.  Some baseline confusion PSYCHIATRIC:  patient is alert and oriented x 2.  SKIN: No  obvious rash, lesion, or ulcer.   LABORATORY PANEL:  CBC Recent Labs  Lab 02/20/20 0441  WBC 7.8  HGB 11.8*  HCT 32.4*  PLT 69*    Chemistries  Recent Labs  Lab 02/19/20 0817 02/19/20 1030 02/20/20 0441  NA 138   < > 142  K 5.0   < > 4.1  CL 104   < > 109  CO2 24   < > 25  GLUCOSE 177*   < > 116*  BUN 71*   < > 56*  CREATININE 2.54*   < > 1.84*  CALCIUM 9.1   < > 9.3  MG 2.3  --   --   AST 33   < > 30  ALT 25   < > 26  ALKPHOS 67   < > 55  BILITOT 1.8*   < > 2.5*   < > = values in this interval not displayed.   Cardiac Enzymes No results for input(s): TROPONINI in the last 168 hours. RADIOLOGY:  CT ABDOMEN PELVIS WO CONTRAST  Result Date: 02/19/2020 CLINICAL DATA:  Altered mental status.  Vomiting. EXAM: CT ABDOMEN AND PELVIS WITHOUT CONTRAST TECHNIQUE: Multidetector CT imaging of the abdomen and pelvis was performed following the standard protocol without IV contrast. COMPARISON:  Renal ultrasound dated November 19, 2018. FINDINGS: Lower chest: No acute abnormality. Minimal dependent subsegmental atelectasis in both lower lobes. Bilateral gynecomastia. Hepatobiliary: Slightly nodular liver contour. No focal liver abnormality. Multiple small layering gallstones. No gallbladder wall thickening. Dilated proximal common bile duct measuring up to 17 mm, with normal tapering at the ampulla. Pancreas: Unremarkable. No pancreatic ductal dilatation or surrounding inflammatory changes. Spleen: Mildly enlarged.  No focal abnormality. Adrenals/Urinary Tract: The adrenal glands and left kidney are unremarkable. Punctate calculus in the lower pole of the right kidney. No hydronephrosis. The bladder is unremarkable. Stomach/Bowel: Stomach is within normal limits. Appendix appears normal. No evidence of bowel wall thickening, distention, or inflammatory changes. Vascular/Lymphatic: Aortic atherosclerosis. No enlarged abdominal or pelvic lymph nodes. Reproductive: Prostatomegaly with median  lobe hypertrophy indenting the bladder base. Other: No abdominal wall hernia or abnormality. No abdominopelvic ascites. No pneumoperitoneum. Musculoskeletal: No acute or significant osseous findings. IMPRESSION: 1. Dilated proximal common bile duct measuring up to 17 mm, with normal tapering at the ampulla. No obstructing lesion or gallstone identified. Correlate with LFTs and consider further evaluation with ERCP or MRCP as clinically indicated. 2. Cholelithiasis. 3. 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 4. Punctate right nephrolithiasis. 5. Aortic Atherosclerosis (ICD10-I70.0). Electronically Signed   By: Titus Dubin M.D.   On: 02/19/2020 13:43   CT Head Wo Contrast  Result Date: 02/19/2020 CLINICAL DATA:  Vomiting altered mental status. EXAM: CT HEAD WITHOUT CONTRAST TECHNIQUE: Contiguous axial images were obtained from the base of the skull through the vertex without intravenous contrast. COMPARISON:  02/14/2020 FINDINGS: Brain: Stable mild age advanced cerebral atrophy, ventriculomegaly and periventricular white matter disease. No extra-axial fluid collections are identified. No CT findings for acute hemispheric infarction or intracranial hemorrhage. No mass lesions. The brainstem and cerebellum are normal. Vascular: Stable vascular calcifications. No aneurysm or hyperdense vessels. Skull: Fell no skull fracture or bone lesions. Sinuses/Orbits: Size the paranasal sinuses and mastoid air cells are clear. The globes are intact. Other: Other no scalp lesions or scalp hematoma. IMPRESSION: 1. Stable mild age advanced cerebral atrophy, ventriculomegaly and periventricular white matter disease. 2. No acute intracranial findings or mass lesions. Electronically Signed   By: Marijo Sanes M.D.   On: 02/19/2020 08:56   ASSESSMENT AND PLAN:  Dean Lynch is a 70 y.o. 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  Acute metabolic encephalopathy: Etiology is not clear.  CT head did not show acute intracranial abnormalities today.  Patient has possible dementia. Patient is followed up by neurologist Dr Manuella Ghazi who is working up for etiology.   -Cannot do MRI of brain due to presence of pacemaker -- patient more awake alert and oriented. Wife at bedside today. -- Discussed with wife regarding recent ER visits and neurology follow-up. She was very emotional and appeared quite overwhelmed with changes/behavior with patient. -- I discussed with her if she wants to continue Depakote as prescribed by Dr. Manuella Ghazi she is agreeable will resume at -- holding Seroquel. -- Discussed nature of dementia what to expect and long-term outlook. Wife tells me she has no other support. She has no other family member other than patient. I told her will have social worker discuss options with her  Benign prostatic hyperplasia without lower urinary tract symptoms -resme dutasteride  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 tophus -resumeUloric  Essential hypertension -Hold lisinopril-HCTZ due to elevated creat -cont Coreg, add amlodipine -IV hydralazine as needed  Hypercholesterolemia: Patient used to take pravastatin, currently not taking these medications -Follow-up with PCP  Thrombocytopenia [facility]): Patient has history of thrombocytopenia.  Unclear etiology.  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  Recent A1c 5.0,  well controlled.  Patient's not taking medications --No treatment needed  Nausea & vomiting: Etiology is not clear.  Lipase 87.   --CT abdomen/pelvis showed Dilated proximal common bile duct measuring up to 17 mm, with normal tapering at the ampulla. No  obstructing lesion or gallstone identified.  Liver function with ALP 67, ALT 33, ALT 25, total bilirubin 1.8 -->2.0. -As needed Zofran for nausea -- no vomiting. Tolerating diet.  Acute renal failure superimposed on stage 3a chronic kidney disease due to G.I. losses. ([place]): Baseline creatinine 1.2-1.4. --  His creatinine is 2.54, BUN 71.--1.84 --  Likely due to dehydration and continuation of Prinzide.  -- CT scan of abdomen did not show hydronephrosis. -IV fluid as above -Hold Prinzide  Chronic diastolic CHF (congestive heart failure) ([place]): 2D echo on 12/24/2019 showed EF 65 to 70% with grade 1 diastolic dysfunction.  Patient does not have leg edema.  No respiratory distress.  CHF seem to be compensated.   DVT ppx: SCD Code Status: DNR per his wife Family Communication:  Yes, patient's wife in the room Disposition Plan:  Anticipate discharge back to previous environment Consults called:  none Admission status and Level of care: Med-Surg:    Med-surg bed for obs  Status [id number]  Dispo: The patient is from: Home  Anticipated d/c is to: Home  Anticipated d/c date is: 1 day  Patient currently is not medically stable to d/c.              Difficult to place patient No patient's mentation is improved. He is eating a little bit better. Will await physical therapy to see patient. TOC for discharge planning.  Level of care: Med-Surg Status is: Inpatient         TOTAL TIME TAKING CARE OF THIS PATIENT: *25* minutes.  >50% time spent on counselling and coordination of care  Note: This dictation was prepared with Dragon dictation along with smaller phrase technology. Any transcriptional errors that result from this process are unintentional.  Fritzi Mandes M.D    [facility]   CC: Primary care physician; Toni Arthurs, NPPatient ID: Dean Lynch, male   DOB: [birth year], 70 y.o.   MRN: [id number]

## 2020-02-20 NOTE — Plan of Care (Signed)
  Problem: Clinical Measurements: Goal: Ability to maintain clinical measurements within normal limits will improve Outcome: Progressing Goal: Will remain free from infection Outcome: Progressing   Problem: Nutrition: Goal: Adequate nutrition will be maintained Outcome: Progressing   Problem: Coping: Goal: Level of anxiety will decrease Outcome: Progressing   Problem: Elimination: Goal: Will not experience complications related to bowel motility Outcome: Progressing

## 2020-02-20 NOTE — Progress Notes (Deleted)
Pt arrive unit from the ED. Per ED report

## 2020-02-20 NOTE — Progress Notes (Addendum)
Pt arrive unit from the ED. Per ED secure chat from Emi Belfast RN 'This pt is here for AMS. He is arousable to a light sternal rub which is how he has been since he arrived. Per previous RN he has not spoke since arrival and is not really able to follow commands well. I did just get him to lift his tongue a little and close his mouth so I could take his temp. They have had to have RT come down and deep suction him a couple of times throughout the day. Vital signs are stable' vitals remained stable on the floor.   0600: pt alert and oriented to self. Pt reoriented to place and pt said he's happy to be in the hospital.   02/20/20 0026  Assess: MEWS Score  Level of Consciousness Responds to Pain  Assess: MEWS Score  MEWS Temp 0  MEWS Systolic 0  MEWS Pulse 0  MEWS RR 0  MEWS LOC 2  MEWS Score 2  MEWS Score Color Yellow  Assess: if the MEWS score is Yellow or Red  Were vital signs taken at a resting state? Yes  Focused Assessment No change from prior assessment  Early Detection of Sepsis Score *See Row Information* Low  MEWS guidelines implemented *See Row Information* No, previously yellow, continue vital signs every 4 hours  Treat  Pain Scale PAINAD  Breathing 0  Negative Vocalization 0  Facial Expression 0  Body Language 0  Consolability 0  PAINAD Score 0  Escalate  MEWS: Escalate Yellow: discuss with charge nurse/RN and consider discussing with provider and RRT  Notify: Charge Nurse/RN  Name of Charge Nurse/RN Notified Sonya RN  Date Charge Nurse/RN Notified 02/20/20  Time Charge Nurse/RN Notified 0045  Document  Patient Outcome Other (Comment)  Progress note created (see row info) Yes

## 2020-02-21 NOTE — Discharge Summary (Signed)
[facility] [place] NAME: Dean Lynch    MR#:  [id number]  DATE OF BIRTH:  January 23, 1950  DATE OF ADMISSION:  02/19/2020 ADMITTING PHYSICIAN: Ivor Costa, MD  DATE OF DISCHARGE: 02/21/2020  PRIMARY CARE PHYSICIAN: Toni Arthurs, NP    ADMISSION DIAGNOSIS:  Dehydration [E86.0] Somnolence [R40.0] Polypharmacy [Z79.899] AKI (acute kidney injury) ([place]) [W09.8] Acute metabolic encephalopathy [J19.14]  DISCHARGE DIAGNOSIS:  Acute metabolic encephalopathy suspected due to dehydration in the setting of dementia acute on chronic renal failure 3a SECONDARY DIAGNOSIS:   Past Medical History:  Diagnosis Date  . BPH (benign prostatic hyperplasia)   . Diabetes mellitus without complication ([place])   . Gout   . Hypercholesteremia   . Hypertension   . Skin cancer 2017   melanoa- removed at [facility]  . 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  Acute metabolic encephalopathy:Etiology is not clear.  -CT head did not show acute intracranial abnormalities today. Patient has been diagnosed with dementia.Patient is followed up by neurologist Dr Manuella Ghazi who is working up for etiology. -Cannot do MRIof braindue to presence of pacemaker -- patient more awake alert and oriented. Wife at bedside today. -- Discussed with wife regarding recent ER visits and neurology follow-up.  -- I discussed with her will  continue Depakote as prescribed by Dr. Manuella Ghazi she is agreeable -- wife agreebale with HHPT  Benign prostatic hyperplasia without lower urinary tract symptoms -resume dutasteride  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 tophus -resume Uloric  Essential hypertension -resumed  lisinopril-HCTZ due to elevated creat -cont Coreg -IV hydralazine as needed  Hypercholesterolemia:Patient used to take pravastatin, currently not taking these medications -Follow-up with PCP  Thrombocytopenia (HCC):Patient has history of thrombocytopenia.Unclear etiology. 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 Recent A1c 5.0, well controlled. Patient's not taking medications --No treatment needed  Nausea & vomiting:Etiology is not clear. Lipase 87.  --CT abdomen/pelvis showedDilated proximal common bile duct measuring up to 17 mm, with normal tapering at the ampulla. No obstructing lesion or gallstone identified.Liver function with ALP 67, ALT 33, ALT 25, total bilirubin 1.8 -->2.0. -As needed Zofran for nausea -- no vomiting. Tolerating diet.  Acute renal failure superimposed on stage 3a chronic kidney disease due to G.I. losses. (HCC):Baseline creatinine 1.2-1.4. -- His creatinine is 2.54, BUN 71.--1.84 after IVF. Appears Euvolemic -- Likely due to dehydration with GI losses --CT scan of abdomen did not show hydronephrosis.  Chronic diastolic CHF (congestive heart failure) ([place]): 2D echo on 12/24/2019 showed EF 65 to 70% with grade 1 diastolic dysfunction. Patient does not have leg edema. No respiratory distress. CHF seem to be compensated.   DVT ppx:SCD Code Status:DNR per his wife Family Communication: Yes, patient's wife in the room Disposition Plan: Anticipate discharge back to previous environment Consults called:none Admission status and Level of care:Med-Surg: Med-surg bed for obs  Status NW:[id number]  Dispo: The patient is from:Home Anticipated d/c is MV:HQIO Anticipated d/c date is: today Patient currently is  medically stable to d/c. Difficult to place patient  No patient's mentation is improved. He is eating much better. Ambulated around the nurses station with physical therapy. PT recommends home health PT. Discussed be discharge plan with TOC  and patient's wife was in the room. She wants to take patient home.  Level of care: Med-Surg Status is: Inpatient    CONSULTS OBTAINED:    DRUG ALLERGIES:  No Known Allergies  DISCHARGE MEDICATIONS:   Allergies as of 02/21/2020   No Known Allergies     Medication List    TAKE these medications   aspirin EC 81 MG tablet Take 81 mg by mouth daily.   carvedilol 6.25 MG tablet Commonly known as: COREG Take 1 tablet (6.25 mg total) by mouth 2 (two) times daily with a meal.   divalproex 125 MG DR tablet Commonly known as: DEPAKOTE Take 125 mg by mouth at bedtime.   divalproex 250 MG DR tablet Commonly known as: DEPAKOTE Take 250 mg by mouth at bedtime.   dutasteride 0.5 MG capsule Commonly known as: AVODART Take 0.5 mg by mouth every morning.   febuxostat 40 MG tablet Commonly known as: ULORIC Take 40 mg by mouth daily.   Fish Oil 1000 MG Caps Take 1,000 mg by mouth daily.   lisinopril-hydrochlorothiazide 20-12.5 MG tablet Commonly known as: ZESTORETIC Take 1 tablet by mouth every morning.   magnesium oxide 400 MG tablet Commonly known as: MAG-OX Take 400 mg by mouth 2 (two) times daily.   QUEtiapine 25 MG tablet Commonly known as: SEROquel Take 1 tablet (25 mg total) by mouth 2 (two) times daily. Take one at bedtime plus one daily as needed only for agitation   traZODone 50 MG tablet Commonly known as: DESYREL Take 50 mg by mouth at bedtime.   Vitamin D3 50 MCG (2000 UT) capsule Take 2,000 Units by mouth 2 (two) times daily.            Durable Medical Equipment  (From admission, onward)         Start     Ordered   02/21/20 1027  DME Walker  Once       Question Answer Comment  Walker: With 5 Inch Wheels   Patient needs a walker to treat with the  following condition Weakness      02/21/20 1028           Discharge Care Instructions  (From admission, onward)         Start     Ordered   02/21/20 0000  Discharge wound care:       Comments: Foam padding   02/21/20 1028          If you experience worsening of your admission symptoms, develop shortness of breath, life threatening emergency, suicidal or homicidal thoughts you must seek medical attention immediately by calling 911 or calling your MD immediately  if symptoms less severe.  You Must read complete instructions/literature along with all the possible adverse reactions/side effects for all the Medicines you take and that have been prescribed to you. Take any new Medicines after you have completely understood and accept all the possible adverse reactions/side effects.   Please note  You were cared for by a hospitalist during your hospital stay. If you have any questions about your discharge medications or the care you received while you were in the hospital after you are discharged, you can call the unit and asked to speak with the hospitalist on call if the hospitalist that took care of you is not available. Once you are discharged, your primary care physician will handle any further medical issues. Please note that NO REFILLS for any discharge medications will be authorized once you  are discharged, as it is imperative that you return to your primary care physician (or establish a relationship with a primary care physician if you do not have one) for your aftercare needs so that they can reassess your need for medications and monitor your lab values. Today   SUBJECTIVE  Doing well. Ate good BF today. Mild headache Wife in the room   VITAL SIGNS:  Blood pressure (!) 144/73, pulse 79, temperature 98.8 F (37.1 C), resp. rate 17, height 5\' 10"  (1.778 m), weight 101 kg, SpO2 99 %.  I/O:    Intake/Output Summary (Last 24 hours) at 02/21/2020 1035 Last data filed at  02/21/2020 0522 Gross per 24 hour  Intake 240 ml  Output 1900 ml  Net -1660 ml    PHYSICAL EXAMINATION:  GENERAL:  70 y.o.-year-old patient lying in the bed with no acute distress.  LUNGS: Normal breath sounds bilaterally, no wheezing, rales,rhonchi or crepitation. No use of accessory muscles of respiration.  CARDIOVASCULAR: S1, S2 normal. No murmurs, rubs, or gallops.  ABDOMEN: Soft, non-tender, non-distended. Bowel sounds present. No organomegaly or mass.  EXTREMITIES: No pedal edema, cyanosis, or clubbing.  NEUROLOGIC: Cranial nerves II through XII are intact. Muscle strength 5/5 in all extremities. Sensation intact. Gait not checked.  PSYCHIATRIC: The patient is alert and oriented x2  SKIN: No obvious rash, lesion, or ulcer.   DATA REVIEW:   CBC  Recent Labs  Lab 02/20/20 0441  WBC 7.8  HGB 11.8*  HCT 32.4*  PLT 69*    Chemistries  Recent Labs  Lab 02/19/20 0817 02/19/20 1030 02/20/20 0441  NA 138   < > 142  K 5.0   < > 4.1  CL 104   < > 109  CO2 24   < > 25  GLUCOSE 177*   < > 116*  BUN 71*   < > 56*  CREATININE 2.54*   < > 1.84*  CALCIUM 9.1   < > 9.3  MG 2.3  --   --   AST 33   < > 30  ALT 25   < > 26  ALKPHOS 67   < > 55  BILITOT 1.8*   < > 2.5*   < > = values in this interval not displayed.    Microbiology Results   Recent Results (from the past 240 hour(s))  SARS CORONAVIRUS 2 (TAT 6-24 HRS) Nasopharyngeal Nasopharyngeal Swab     Status: None   Collection Time: 02/15/20  1:01 AM   Specimen: Nasopharyngeal Swab  Result Value Ref Range Status   SARS Coronavirus 2 NEGATIVE NEGATIVE Final    Comment: (NOTE) SARS-CoV-2 target nucleic acids are NOT DETECTED.  The SARS-CoV-2 RNA is generally detectable in upper and lower respiratory specimens during the acute phase of infection. Negative results do not preclude SARS-CoV-2 infection, do not rule out co-infections with other pathogens, and should not be used as the sole basis for treatment or other  patient management decisions. Negative results must be combined with clinical observations, patient history, and epidemiological information. The expected result is Negative.  Fact Sheet for Patients: SugarRoll.be  Fact Sheet for Healthcare Providers: https://www.woods-mathews.com/  This test is not yet approved or cleared by the [place] FDA and  has been authorized for detection and/or diagnosis of SARS-CoV-2 by FDA under an Emergency Use Authorization (EUA). This EUA will remain  in effect (meaning this test can be used) for the duration of the COVID-19 declaration under Se ction 564(b)(1) of  the Act, 21 U.S.C. section 360bbb-3(b)(1), unless the authorization is terminated or revoked sooner.  Performed at [facility], [place] [address], [place], [place] [address]   SARS CORONAVIRUS 2 (TAT 6-24 HRS) Nasopharyngeal Nasopharyngeal Swab     Status: None   Collection Time: 02/19/20 12:06 PM   Specimen: Nasopharyngeal Swab  Result Value Ref Range Status   SARS Coronavirus 2 NEGATIVE NEGATIVE Final    Comment: (NOTE) SARS-CoV-2 target nucleic acids are NOT DETECTED.  The SARS-CoV-2 RNA is generally detectable in upper and lower respiratory specimens during the acute phase of infection. Negative results do not preclude SARS-CoV-2 infection, do not rule out co-infections with other pathogens, and should not be used as the sole basis for treatment or other patient management decisions. Negative results must be combined with clinical observations, patient history, and epidemiological information. The expected result is Negative.  Fact Sheet for Patients: SugarRoll.be  Fact Sheet for Healthcare Providers: https://www.woods-mathews.com/  This test is not yet approved or cleared by the [place] FDA and  has been authorized for detection and/or diagnosis of SARS-CoV-2 by FDA under an  Emergency Use Authorization (EUA). This EUA will remain  in effect (meaning this test can be used) for the duration of the COVID-19 declaration under Se ction 564(b)(1) of the Act, 21 U.S.C. section 360bbb-3(b)(1), unless the authorization is terminated or revoked sooner.  Performed at [facility], [place] [address], [place], [place] [address]     RADIOLOGY:  CT ABDOMEN PELVIS WO CONTRAST  Result Date: 02/19/2020 CLINICAL DATA:  Altered mental status.  Vomiting. EXAM: CT ABDOMEN AND PELVIS WITHOUT CONTRAST TECHNIQUE: Multidetector CT imaging of the abdomen and pelvis was performed following the standard protocol without IV contrast. COMPARISON:  Renal ultrasound dated November 19, 2018. FINDINGS: Lower chest: No acute abnormality. Minimal dependent subsegmental atelectasis in both lower lobes. Bilateral gynecomastia. Hepatobiliary: Slightly nodular liver contour. No focal liver abnormality. Multiple small layering gallstones. No gallbladder wall thickening. Dilated proximal common bile duct measuring up to 17 mm, with normal tapering at the ampulla. Pancreas: Unremarkable. No pancreatic ductal dilatation or surrounding inflammatory changes. Spleen: Mildly enlarged.  No focal abnormality. Adrenals/Urinary Tract: The adrenal glands and left kidney are unremarkable. Punctate calculus in the lower pole of the right kidney. No hydronephrosis. The bladder is unremarkable. Stomach/Bowel: Stomach is within normal limits. Appendix appears normal. No evidence of bowel wall thickening, distention, or inflammatory changes. Vascular/Lymphatic: Aortic atherosclerosis. No enlarged abdominal or pelvic lymph nodes. Reproductive: Prostatomegaly with median lobe hypertrophy indenting the bladder base. Other: No abdominal wall hernia or abnormality. No abdominopelvic ascites. No pneumoperitoneum. Musculoskeletal: No acute or significant osseous findings. IMPRESSION: 1. Dilated proximal common bile duct measuring up to  17 mm, with normal tapering at the ampulla. No obstructing lesion or gallstone identified. Correlate with LFTs and consider further evaluation with ERCP or MRCP as clinically indicated. 2. Cholelithiasis. 3. 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 4. Punctate right nephrolithiasis. 5. Aortic Atherosclerosis (ICD10-I70.0). Electronically Signed   By: Titus Dubin M.D.   On: 02/19/2020 13:43     CODE STATUS:     Code Status Orders  (From admission, onward)         Start     Ordered   02/19/20 1242  Do not attempt resuscitation (DNR)  Continuous        02/19/20 1241        Code Status History    Date Active Date Inactive Code  Status Order ID Comments User Context   02/19/2020 1208 02/19/2020 1241 Full Code [id number]  Ivor Costa, MD ED   02/15/2020 0821 02/16/2020 1722 Full Code [id number]  Blake Divine, MD ED   12/23/2019 1347 12/25/2019 1836 Full Code [id number]  Collier Bullock, MD ED   Advance Care Planning Activity       TOTAL TIME TAKING CARE OF THIS PATIENT: *35* minutes.    Fritzi Mandes M.D  [facility]    CC: Primary care physician; Toni Arthurs, NP

## 2020-02-21 NOTE — Progress Notes (Signed)
   02/21/20 1148  Clinical Encounter Type  Visited With Patient  Visit Type Follow-up;Spiritual support  Referral From Nurse  Consult/Referral To [place] did a follow up visit with Pt. Pt was in the room by himself. I asked for his wife and he stated, "she is gone to get my clothes because I am getting out today." I offered encouraging words and best wishes. I told him I will try to see his wife before they leave today.  Pt stated, I will let her know the Chaplain came by.

## 2020-02-21 NOTE — Plan of Care (Signed)
  Problem: Safety: Goal: Ability to remain free from injury will improve Outcome: Progressing   

## 2020-02-21 NOTE — Evaluation (Signed)
Physical Therapy Evaluation Patient Details Name: ROLLYN SCIALDONE MRN: [id number] DOB: 1950-02-22 Today's Date: 02/21/2020   History of Present Illness  presented to ER secondary to AMS, nausea/vomiting; admitted for management of acute metabolic encephalopathy of unknown etiology (? dementia?)  Clinical Impression  Patient resting in bed upon arrival to room; awake and interactive with therapist upon entry.  Alert and oriented to self, location; follows commands and participates well with session.  Eager for [place] activities and progression towards discharge home.  Bilat UE/LE strength and ROM grossly symmetrical and WFL; no focal weakness appreciated.  Able to complete bed mobility indep; sit/stand, basic transfers and gait (80') without assist device, cga/min assist.  Demonstrates reciprocal stepping with decreased step height/length, limited trunk rotation and arm swing; Able to complete head turns, start/stop and change of direction without overt buckling or LOB (but mild delay in balance reactions).  Additional gait trial completed with RW, 300' with RW, cga/close sup-improved step height/length and overall gait fluidity; increased cadence and overall gait speed (10' walk time, 9-10 seconds).  Do recommend continued use of RW for optimal safety with gait efforts; patient voiced understanding and agreement. Would benefit from skilled PT to address above deficits and promote optimal return to PLOF.; Recommend transition to HHPT upon discharge from acute hospitalization.     Follow Up Recommendations Home health PT    Equipment Recommendations  Rolling walker with 5" wheels    Recommendations for Other Services       Precautions / Restrictions Precautions Precautions: Fall Restrictions Weight Bearing Restrictions: No      Mobility  Bed Mobility Overal bed mobility: Modified Independent                  Transfers Overall transfer level: Needs assistance Equipment used:  Rolling walker (2 wheeled) Transfers: Sit to/from Stand Sit to Stand: Min guard         General transfer comment: cuing for hand placement to prevent pulling on RW  Ambulation/Gait Ambulation/Gait assistance: Min guard Gait Distance (Feet): 80 Feet Assistive device: None       General Gait Details: reciprocal stepping with decreased step height/length, limited trunk rotation and arm swing; Able to complete head turns, start/stop and change of direction without overt buckling or LOB (but mild delay in balance reactions).  Stairs            Wheelchair Mobility    Modified Rankin (Stroke Patients Only)       Balance Overall balance assessment: Needs assistance Sitting-balance support: No upper extremity supported;Feet supported Sitting balance-Leahy Scale: Good     Standing balance support: No upper extremity supported Standing balance-Leahy Scale: Fair                               Pertinent Vitals/Pain Pain Assessment: No/denies pain    Home Living Family/patient expects to be discharged to:: Private residence Living Arrangements: Spouse/significant other Available Help at Discharge: Family;Available PRN/intermittently Type of Home: House                Prior Function Level of Independence: Independent         Comments: Indep with ADLs, household and community mobilization without assist device     Hand Dominance        Extremity/Trunk Assessment   Upper Extremity Assessment Upper Extremity Assessment: Overall WFL for tasks assessed    Lower Extremity Assessment Lower Extremity Assessment: Overall [facility]  for tasks assessed (grossly 4+/5 throughout bilat LEs)       Communication   Communication: No difficulties  Cognition Arousal/Alertness: Awake/alert Behavior During Therapy: WFL for tasks assessed/performed Overall Cognitive Status: No family/caregiver present to determine baseline cognitive functioning                                  General Comments: Alert and oriented to self, location and general situation; follows commands, pleasant and cooperative throughout session.  Fair insight into deficits and safety needs noted      General Comments      Exercises Other Exercises Other Exercises: 300' with RW, cga/close sup-improved step height/length and overall gait fluidity; increased cadence and overall gait speed (10' walk time, 9-10 seconds).  Do recommend continued use of RW for optimal safety with gait efforts; patient voiced understanding and agreement.   Assessment/Plan    PT Assessment Patient needs continued PT services  PT Problem List Decreased activity tolerance;Decreased balance;Decreased mobility;Decreased cognition;Decreased knowledge of use of DME;Decreased safety awareness;Decreased knowledge of precautions       PT Treatment Interventions DME instruction;Gait training;Stair training;Functional mobility training;Therapeutic activities;Therapeutic exercise;Balance training;Patient/family education    PT Goals (Current goals can be found in the Care Plan section)  Acute Rehab PT Goals Patient Stated Goal: to go home! PT Goal Formulation: With patient Time For Goal Achievement: 03/06/20 Potential to Achieve Goals: Good    Frequency Min 2X/week   Barriers to discharge        Co-evaluation               AM-PAC PT "6 Clicks" Mobility  Outcome Measure Help needed turning from your back to your side while in a flat bed without using bedrails?: None Help needed moving from lying on your back to sitting on the side of a flat bed without using bedrails?: None Help needed moving to and from a bed to a chair (including a wheelchair)?: A Little Help needed standing up from a chair using your arms (e.g., wheelchair or bedside chair)?: A Little Help needed to walk in hospital room?: A Little Help needed climbing 3-5 steps with a railing? : A Little 6 Click Score: 20     End of Session Equipment Utilized During Treatment: Gait belt Activity Tolerance: Patient tolerated treatment well Patient left: in chair;with call bell/phone within reach;with chair alarm set Nurse Communication: Mobility status PT Visit Diagnosis: Muscle weakness (generalized) (M62.81);Difficulty in walking, not elsewhere classified (R26.2)    Time: 0076-2263 PT Time Calculation (min) (ACUTE ONLY): 22 min   Charges:   PT Evaluation $PT Eval Moderate Complexity: 1 Mod PT Treatments $Gait Training: 8-22 mins        Kristen H. Owens Shark, PT, DPT, NCS 02/21/20, 10:20 AM [phone]

## 2020-02-21 NOTE — TOC Progression Note (Signed)
Transition of Care [facility]) - Progression Note    Patient Details  Name: Dean Lynch MRN: [id number] Date of Birth: 1950/08/29  Transition of Care [facility]) CM/SW [place], RN Phone Number: 02/21/2020, 11:01 AM  Clinical Narrative:   RNCM met with patient in room to discuss recommendations for home health and rolling walker at discharge. Patient's wife not in room, patient reports that he does have a walker at home but that he has never had therapy at home before. RNCM reached out and left VM for patient's wife to follow up on recommendations.     Expected Discharge Plan: Skilled Nursing Facility Barriers to Discharge: Continued Medical Work up  Expected Discharge Plan and Services Expected Discharge Plan: [facility]         Expected Discharge Date: 02/21/20                                     Social Determinants of Health (SDOH) Interventions    Readmission Risk Interventions No flowsheet data found.

## 2020-02-23 DIAGNOSIS — G309 Alzheimer's disease, unspecified: Secondary | ICD-10-CM | POA: Diagnosis not present

## 2020-02-23 DIAGNOSIS — Z950 Presence of cardiac pacemaker: Secondary | ICD-10-CM | POA: Diagnosis not present

## 2020-02-23 DIAGNOSIS — I5032 Chronic diastolic (congestive) heart failure: Secondary | ICD-10-CM | POA: Diagnosis not present

## 2020-02-23 DIAGNOSIS — E538 Deficiency of other specified B group vitamins: Secondary | ICD-10-CM | POA: Diagnosis not present

## 2020-02-23 DIAGNOSIS — H9193 Unspecified hearing loss, bilateral: Secondary | ICD-10-CM | POA: Diagnosis not present

## 2020-02-23 DIAGNOSIS — N400 Enlarged prostate without lower urinary tract symptoms: Secondary | ICD-10-CM | POA: Diagnosis not present

## 2020-02-23 DIAGNOSIS — Z9181 History of falling: Secondary | ICD-10-CM | POA: Diagnosis not present

## 2020-02-23 DIAGNOSIS — E1140 Type 2 diabetes mellitus with diabetic neuropathy, unspecified: Secondary | ICD-10-CM | POA: Diagnosis not present

## 2020-02-23 DIAGNOSIS — I130 Hypertensive heart and chronic kidney disease with heart failure and stage 1 through stage 4 chronic kidney disease, or unspecified chronic kidney disease: Secondary | ICD-10-CM | POA: Diagnosis not present

## 2020-02-23 DIAGNOSIS — N1831 Chronic kidney disease, stage 3a: Secondary | ICD-10-CM | POA: Diagnosis not present

## 2020-02-23 DIAGNOSIS — D696 Thrombocytopenia, unspecified: Secondary | ICD-10-CM | POA: Diagnosis not present

## 2020-02-23 DIAGNOSIS — F0280 Dementia in other diseases classified elsewhere without behavioral disturbance: Secondary | ICD-10-CM | POA: Diagnosis not present

## 2020-02-23 DIAGNOSIS — I441 Atrioventricular block, second degree: Secondary | ICD-10-CM | POA: Diagnosis not present

## 2020-02-23 DIAGNOSIS — R400 Somnolence: Secondary | ICD-10-CM | POA: Diagnosis not present

## 2020-02-23 DIAGNOSIS — Z7982 Long term (current) use of aspirin: Secondary | ICD-10-CM | POA: Diagnosis not present

## 2020-02-23 DIAGNOSIS — E7800 Pure hypercholesterolemia, unspecified: Secondary | ICD-10-CM | POA: Diagnosis not present

## 2020-02-23 DIAGNOSIS — M1A9XX0 Chronic gout, unspecified, without tophus (tophi): Secondary | ICD-10-CM | POA: Diagnosis not present

## 2020-02-23 DIAGNOSIS — Z85820 Personal history of malignant melanoma of skin: Secondary | ICD-10-CM | POA: Diagnosis not present

## 2020-02-23 DIAGNOSIS — E1122 Type 2 diabetes mellitus with diabetic chronic kidney disease: Secondary | ICD-10-CM | POA: Diagnosis not present

## 2020-02-28 DIAGNOSIS — D631 Anemia in chronic kidney disease: Secondary | ICD-10-CM | POA: Diagnosis not present

## 2020-02-28 DIAGNOSIS — N1832 Chronic kidney disease, stage 3b: Secondary | ICD-10-CM | POA: Diagnosis not present

## 2020-02-28 DIAGNOSIS — E1122 Type 2 diabetes mellitus with diabetic chronic kidney disease: Secondary | ICD-10-CM | POA: Diagnosis not present

## 2020-02-28 DIAGNOSIS — I10 Essential (primary) hypertension: Secondary | ICD-10-CM | POA: Diagnosis not present

## 2020-03-01 DIAGNOSIS — F0390 Unspecified dementia without behavioral disturbance: Secondary | ICD-10-CM | POA: Diagnosis not present

## 2020-03-01 DIAGNOSIS — Z09 Encounter for follow-up examination after completed treatment for conditions other than malignant neoplasm: Secondary | ICD-10-CM | POA: Diagnosis not present

## 2020-03-01 DIAGNOSIS — N1832 Chronic kidney disease, stage 3b: Secondary | ICD-10-CM | POA: Diagnosis not present

## 2020-03-07 ENCOUNTER — Encounter: Payer: Self-pay | Admitting: Oncology

## 2020-03-07 ENCOUNTER — Inpatient Hospital Stay (HOSPITAL_BASED_OUTPATIENT_CLINIC_OR_DEPARTMENT_OTHER): Payer: PPO | Admitting: Oncology

## 2020-03-07 ENCOUNTER — Inpatient Hospital Stay: Payer: PPO | Attending: Oncology

## 2020-03-07 VITALS — BP 105/54 | HR 66 | Temp 98.3°F | Resp 16 | Wt 199.0 lb

## 2020-03-07 DIAGNOSIS — D539 Nutritional anemia, unspecified: Secondary | ICD-10-CM | POA: Diagnosis not present

## 2020-03-07 DIAGNOSIS — F0390 Unspecified dementia without behavioral disturbance: Secondary | ICD-10-CM | POA: Insufficient documentation

## 2020-03-07 DIAGNOSIS — D61818 Other pancytopenia: Secondary | ICD-10-CM

## 2020-03-07 DIAGNOSIS — I10 Essential (primary) hypertension: Secondary | ICD-10-CM | POA: Insufficient documentation

## 2020-03-07 DIAGNOSIS — R5383 Other fatigue: Secondary | ICD-10-CM

## 2020-03-07 DIAGNOSIS — E119 Type 2 diabetes mellitus without complications: Secondary | ICD-10-CM | POA: Diagnosis not present

## 2020-03-07 DIAGNOSIS — D696 Thrombocytopenia, unspecified: Secondary | ICD-10-CM

## 2020-03-07 LAB — COMPREHENSIVE METABOLIC PANEL
ALT: 29 U/L (ref 0–44)
AST: 31 U/L (ref 15–41)
Albumin: 3.1 g/dL — ABNORMAL LOW (ref 3.5–5.0)
Alkaline Phosphatase: 82 U/L (ref 38–126)
Anion gap: 8 (ref 5–15)
BUN: 19 mg/dL (ref 8–23)
CO2: 26 mmol/L (ref 22–32)
Calcium: 8.8 mg/dL — ABNORMAL LOW (ref 8.9–10.3)
Chloride: 105 mmol/L (ref 98–111)
Creatinine, Ser: 1.46 mg/dL — ABNORMAL HIGH (ref 0.61–1.24)
GFR, Estimated: 52 mL/min — ABNORMAL LOW (ref >60)
Glucose, Bld: 236 mg/dL — ABNORMAL HIGH (ref 70–99)
Potassium: 5 mmol/L (ref 3.5–5.1)
Sodium: 139 mmol/L (ref 135–145)
Total Bilirubin: 1.8 mg/dL — ABNORMAL HIGH (ref 0.3–1.2)
Total Protein: 5.2 g/dL — ABNORMAL LOW (ref 6.5–8.1)

## 2020-03-07 LAB — CBC WITH DIFFERENTIAL/PLATELET
Abs Immature Granulocytes: 0.01 10*3/uL (ref 0.00–0.07)
Basophils Absolute: 0 10*3/uL (ref 0.0–0.1)
Basophils Relative: 1 %
Eosinophils Absolute: 0.1 10*3/uL (ref 0.0–0.5)
Eosinophils Relative: 2 %
HCT: 31.4 % — ABNORMAL LOW (ref 39.0–52.0)
Hemoglobin: 10.9 g/dL — ABNORMAL LOW (ref 13.0–17.0)
Immature Granulocytes: 0 %
Lymphocytes Relative: 25 %
Lymphs Abs: 0.9 10*3/uL (ref 0.7–4.0)
MCH: 36.2 pg — ABNORMAL HIGH (ref 26.0–34.0)
MCHC: 34.7 g/dL (ref 30.0–36.0)
MCV: 104.3 fL — ABNORMAL HIGH (ref 80.0–100.0)
Monocytes Absolute: 0.3 10*3/uL (ref 0.1–1.0)
Monocytes Relative: 7 %
Neutro Abs: 2.4 10*3/uL (ref 1.7–7.7)
Neutrophils Relative %: 65 %
Platelets: 64 10*3/uL — ABNORMAL LOW (ref 150–400)
RBC: 3.01 MIL/uL — ABNORMAL LOW (ref 4.22–5.81)
RDW: 14 % (ref 11.5–15.5)
WBC: 3.7 10*3/uL — ABNORMAL LOW (ref 4.0–10.5)
nRBC: 0 % (ref 0.0–0.2)

## 2020-03-07 LAB — IRON AND TIBC
Iron: 154 ug/dL (ref 45–182)
Saturation Ratios: 64 % — ABNORMAL HIGH (ref 17.9–39.5)
TIBC: 241 ug/dL — ABNORMAL LOW (ref 250–450)
UIBC: 87 ug/dL

## 2020-03-07 LAB — VITAMIN B12: Vitamin B-12: 715 pg/mL (ref 180–914)

## 2020-03-07 LAB — TSH: TSH: 3.511 u[IU]/mL (ref 0.350–4.500)

## 2020-03-07 LAB — FERRITIN: Ferritin: 319 ng/mL (ref 24–336)

## 2020-03-07 NOTE — Progress Notes (Signed)
Hematology/Oncology Consult note [facility]  [phone] [phone]  Patient Care Team: Toni Arthurs, NP as PCP - General (Family Medicine)   Name of the patient: Dean Lynch  [id number]  November 20, 1950   Date of visit: 03/07/20  Diagnosis-pancytopenia possibly secondary to [place]  Chief complaint/ Reason for visit-routine follow-up of pancytopenia  Heme/Onc history: patient is a 70 year old male with a past medical history significant for hypertension, hyperlipidemia and type 2 diabetes and other medical problems. He has been referred to [place] for evaluation and management of thrombocytopenia. Recent CBC from 07/16/2016 showed white count of 7.1, H&H of 9/38.2 with an MCV of 97 and a platelet count of 110. On review of his prior CBCs since 2015 his platelet counts have always been between [id number]'s. Recent CMP was within normal limits except for a mildly elevated total bilirubin of 1.5. HIV and hepatitis C testing was negative. TSH was within normal limits.  He denies any bleeding, burising other than occasional bruises on his forearms. No OTC medications or herbal supplements.  Extensive anemia work-up Showed normal B12, folate, iron studies although iron saturation was elevated at 66%.  TSH was normal.  Myeloma panel was unremarkable.  Serum free light chain ratio was normal.  Reticulocyte count was 2.3%.  CBC showed white count of 5.2, hemoglobin of 11.8 and a platelet count of 97.  Hematocrit and MCV has not been reported due to presence of interfering substance although historically patient has had longstanding macrocytosis.  On 04/22/2019  Interval history-patient reports some fatigue but most of the history is obtained with the help of his wife who reports that patient's mental status has been slowly declining and he has had 3 hospitalizations in the last 2 months.  ECOG PS- 1 Pain scale- 0   Review of systems- Review of Systems   Constitutional: Positive for malaise/fatigue. Negative for chills, fever and weight loss.  HENT: Negative for congestion, ear discharge and nosebleeds.   Eyes: Negative for blurred vision.  Respiratory: Negative for cough, hemoptysis, sputum production, shortness of breath and wheezing.   Cardiovascular: Negative for chest pain, palpitations, orthopnea and claudication.  Gastrointestinal: Negative for abdominal pain, blood in stool, constipation, diarrhea, heartburn, melena, nausea and vomiting.  Genitourinary: Negative for dysuria, flank pain, frequency, hematuria and urgency.  Musculoskeletal: Negative for back pain, joint pain and myalgias.  Skin: Negative for rash.  Neurological: Negative for dizziness, tingling, focal weakness, seizures, weakness and headaches.  Endo/Heme/Allergies: Does not bruise/bleed easily.  Psychiatric/Behavioral: Positive for memory loss. Negative for depression and suicidal ideas. The patient does not have insomnia.        No Known Allergies   Past Medical History:  Diagnosis Date  . BPH (benign prostatic hyperplasia)   . Diabetes mellitus without complication ([place])   . Gout   . Hypercholesteremia   . Hypertension   . Skin cancer 2017   melanoa- removed at [facility]  . Thrombocytopenia ([place])      Past Surgical History:  Procedure Laterality Date  . CATARACT EXTRACTION W/PHACO Right 09/27/2015   Procedure: CATARACT EXTRACTION PHACO AND INTRAOCULAR LENS PLACEMENT (IOC);  Surgeon: Leandrew Koyanagi, MD;  Location: [place];  Service: Ophthalmology;  Laterality: Right;  DIABETIC  . KNEE ARTHROSCOPY    . PACEMAKER IMPLANT N/A 12/24/2019   Procedure: PACEMAKER IMPLANT;  Surgeon: Marzetta Board, MD;  Location: [place] [facility];  Service: Cardiovascular;  Laterality: N/A;    Social History   Socioeconomic History  .  Marital status: Married    Spouse name: Not on file  . Number of children: Not on file  . Years of education: Not on file   . Highest education level: Not on file  Occupational History  . Not on file  Tobacco Use  . Smoking status: Never Smoker  . Smokeless tobacco: Never Used  Vaping Use  . Vaping Use: Never used  Substance and Sexual Activity  . Alcohol use: No  . Drug use: No  . Sexual activity: Yes  Other Topics Concern  . Not on file  Social History Narrative  . Not on file   Social Determinants of Health   Financial Resource Strain: Not on file  Food Insecurity: Not on file  Transportation Needs: Not on file  Physical Activity: Not on file  Stress: Not on file  Social Connections: Not on file  Intimate Partner Violence: Not on file    Family History  Problem Relation Age of Onset  . Coronary artery disease Mother   . Polycythemia Mother   . Macular degeneration Mother   . Dementia Mother   . Coronary artery disease Father      Current Outpatient Medications:  .  aspirin EC 81 MG tablet, Take 81 mg by mouth daily. , Disp: , Rfl:  .  Cholecalciferol (VITAMIN D3) 50 MCG (2000 UT) capsule, Take 2,000 Units by mouth 2 (two) times daily., Disp: , Rfl:  .  divalproex (DEPAKOTE) 125 MG DR tablet, Take 125 mg by mouth at bedtime., Disp: , Rfl:  .  dutasteride (AVODART) 0.5 MG capsule, Take 0.5 mg by mouth every morning., Disp: , Rfl:  .  febuxostat (ULORIC) 40 MG tablet, Take 40 mg by mouth daily. , Disp: , Rfl:  .  lisinopril-hydrochlorothiazide (PRINZIDE,ZESTORETIC) 20-12.5 MG tablet, Take 1 tablet by mouth every morning., Disp: , Rfl:  .  magnesium oxide (MAG-OX) 400 MG tablet, Take 400 mg by mouth 2 (two) times daily., Disp: , Rfl:  .  Omega-3 Fatty Acids (FISH OIL) 1000 MG CAPS, Take 1,000 mg by mouth daily., Disp: , Rfl:  .  traZODone (DESYREL) 50 MG tablet, Take 50 mg by mouth at bedtime., Disp: , Rfl:  .  carvedilol (COREG) 6.25 MG tablet, Take 1 tablet (6.25 mg total) by mouth 2 (two) times daily with a meal., Disp: 60 tablet, Rfl: 0 .  divalproex (DEPAKOTE) 250 MG DR tablet, Take  250 mg by mouth at bedtime. (Patient not taking: Reported on 03/07/2020), Disp: , Rfl:  .  QUEtiapine (SEROQUEL) 25 MG tablet, Take 1 tablet (25 mg total) by mouth 2 (two) times daily. Take one at bedtime plus one daily as needed only for agitation (Patient not taking: Reported on 03/07/2020), Disp: 60 tablet, Rfl: 0  Physical exam:  Vitals:   03/07/20 1029  BP: (!) 105/54  Pulse: 66  Resp: 16  Temp: 98.3 F (36.8 C)  TempSrc: Tympanic  SpO2: 100%  Weight: 199 lb (90.3 kg)   Physical Exam Constitutional:      General: He is not in acute distress. Eyes:     Extraocular Movements: EOM normal.  Cardiovascular:     Rate and Rhythm: Normal rate and regular rhythm.     Heart sounds: Normal heart sounds.  Pulmonary:     Effort: Pulmonary effort is normal.     Breath sounds: Normal breath sounds.  Abdominal:     General: Bowel sounds are normal.     Palpations: Abdomen is soft.  Skin:  General: Skin is warm and dry.  Neurological:     Mental Status: He is alert and oriented to person, place, and time.      CMP Latest Ref Rng & Units 03/07/2020  Glucose 70 - 99 mg/dL 236(H)  BUN 8 - 23 mg/dL 19  Creatinine 0.61 - 1.24 mg/dL 1.46(H)  Sodium 135 - 145 mmol/L 139  Potassium 3.5 - 5.1 mmol/L 5.0  Chloride 98 - 111 mmol/L 105  CO2 22 - 32 mmol/L 26  Calcium 8.9 - 10.3 mg/dL 8.8(L)  Total Protein 6.5 - 8.1 g/dL 5.2(L)  Total Bilirubin 0.3 - 1.2 mg/dL 1.8(H)  Alkaline Phos 38 - 126 U/L 82  AST 15 - 41 U/L 31  ALT 0 - 44 U/L 29   CBC Latest Ref Rng & Units 03/07/2020  WBC 4.0 - 10.5 K/uL 3.7(L)  Hemoglobin 13.0 - 17.0 g/dL 10.9(L)  Hematocrit 39.0 - 52.0 % 31.4(L)  Platelets 150 - 400 K/uL 64(L)    No images are attached to the encounter.  CT ABDOMEN PELVIS WO CONTRAST  Result Date: 02/19/2020 CLINICAL DATA:  Altered mental status.  Vomiting. EXAM: CT ABDOMEN AND PELVIS WITHOUT CONTRAST TECHNIQUE: Multidetector CT imaging of the abdomen and pelvis was performed following the  standard protocol without IV contrast. COMPARISON:  Renal ultrasound dated November 19, 2018. FINDINGS: Lower chest: No acute abnormality. Minimal dependent subsegmental atelectasis in both lower lobes. Bilateral gynecomastia. Hepatobiliary: Slightly nodular liver contour. No focal liver abnormality. Multiple small layering gallstones. No gallbladder wall thickening. Dilated proximal common bile duct measuring up to 17 mm, with normal tapering at the ampulla. Pancreas: Unremarkable. No pancreatic ductal dilatation or surrounding inflammatory changes. Spleen: Mildly enlarged.  No focal abnormality. Adrenals/Urinary Tract: The adrenal glands and left kidney are unremarkable. Punctate calculus in the lower pole of the right kidney. No hydronephrosis. The bladder is unremarkable. Stomach/Bowel: Stomach is within normal limits. Appendix appears normal. No evidence of bowel wall thickening, distention, or inflammatory changes. Vascular/Lymphatic: Aortic atherosclerosis. No enlarged abdominal or pelvic lymph nodes. Reproductive: Prostatomegaly with median lobe hypertrophy indenting the bladder base. Other: No abdominal wall hernia or abnormality. No abdominopelvic ascites. No pneumoperitoneum. Musculoskeletal: No acute or significant osseous findings. IMPRESSION: 1. Dilated proximal common bile duct measuring up to 17 mm, with normal tapering at the ampulla. No obstructing lesion or gallstone identified. Correlate with LFTs and consider further evaluation with ERCP or MRCP as clinically indicated. 2. Cholelithiasis. 3. 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 4. Punctate right nephrolithiasis. 5. Aortic Atherosclerosis (ICD10-I70.0). Electronically Signed   By: Titus Dubin M.D.   On: 02/19/2020 13:43   CT Head Wo Contrast  Result Date: 02/19/2020 CLINICAL DATA:  Vomiting altered mental status. EXAM: CT HEAD WITHOUT CONTRAST TECHNIQUE: Contiguous axial images were obtained from the  base of the skull through the vertex without intravenous contrast. COMPARISON:  02/14/2020 FINDINGS: Brain: Stable mild age advanced cerebral atrophy, ventriculomegaly and periventricular white matter disease. No extra-axial fluid collections are identified. No CT findings for acute hemispheric infarction or intracranial hemorrhage. No mass lesions. The brainstem and cerebellum are normal. Vascular: Stable vascular calcifications. No aneurysm or hyperdense vessels. Skull: Fell no skull fracture or bone lesions. Sinuses/Orbits: Size the paranasal sinuses and mastoid air cells are clear. The globes are intact. Other: Other no scalp lesions or scalp hematoma. IMPRESSION: 1. Stable mild age advanced cerebral atrophy, ventriculomegaly and periventricular white matter disease. 2. No acute intracranial findings or mass lesions. Electronically Signed  By: Marijo Sanes M.D.   On: 02/19/2020 08:56   CT Head Wo Contrast  Result Date: 02/14/2020 CLINICAL DATA:  New onset confusion for multiple months EXAM: CT HEAD WITHOUT CONTRAST TECHNIQUE: Contiguous axial images were obtained from the base of the skull through the vertex without intravenous contrast. COMPARISON:  MR imaging 06/27/1999 (report only) FINDINGS: Brain: No evidence of acute infarction, hemorrhage, hydrocephalus, extra-axial collection, visible mass lesion or mass effect. Symmetric prominence of the ventricles, cisterns and sulci compatible with parenchymal volume loss. Patchy areas of white matter hypoattenuation are most compatible with chronic microvascular angiopathy. Vascular: Atherosclerotic calcification of the carotid siphons. No hyperdense vessel. Skull: No calvarial fracture or suspicious osseous lesion. No scalp swelling or hematoma. Sinuses/Orbits: Paranasal sinuses and mastoid air cells are predominantly clear. Prior right lens extraction. Included orbital contents are otherwise unremarkable. Other: Edentulous with mandibular prognathism.  IMPRESSION: 1. No acute intracranial findings. 2. Chronic microvascular angiopathy and parenchymal volume loss. Electronically Signed   By: Lovena Le M.D.   On: 02/14/2020 23:41     Assessment and plan- Patient is a 70 y.o. 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  Most recently was discharged on 02/21/2020 for an episode of altered mental status which was attributed to dehydration.  He has also been diagnosed with dementia and follows up with neurology.  He is on Depakote for mood stabilization.   In terms of his CBC, count was mostly fluctuating between 80s to 100s up until December 2021 and since then it has slowly drifted down patient's to 50's- 60s presently.  Also his hemoglobin previously was stable between 12-13 but today it is down to 10.9.  White count is mildly lower at 3.7 with an ANC of 2.4.  It is unclear if these present labs reflect his recent hospitalization and possible marrow suppression.  His pancytopenia is not significant enough to warrant a bone marrow biopsy.  If there is a consistent decline in his hemoglobin to less than 10 and platelet counts dropped down to less than 30 that would be an indication for a bone marrow.  Iron studies show normal ferritin of 319 and a low TIBC of 241 possibly suggestive of anemia of chronic disease.  TSH is normal and B12 levels are currently pending.    I will see him back in 3 months with Repeat CBC with differential, CMP and EPO levels.  I also suspect there is an element of anemia of kidney disease given that his baseline creatinine fluctuates between 1.2-1.4.     Visit Diagnosis 1. Pancytopenia ([place])   2. Macrocytic anemia      Dr. Randa Evens, MD, MPH [facility] [phone] 03/07/2020 1:14 PM

## 2020-03-08 DIAGNOSIS — N1831 Chronic kidney disease, stage 3a: Secondary | ICD-10-CM | POA: Diagnosis not present

## 2020-03-08 DIAGNOSIS — Z9181 History of falling: Secondary | ICD-10-CM | POA: Diagnosis not present

## 2020-03-08 DIAGNOSIS — I441 Atrioventricular block, second degree: Secondary | ICD-10-CM | POA: Diagnosis not present

## 2020-03-08 DIAGNOSIS — G309 Alzheimer's disease, unspecified: Secondary | ICD-10-CM | POA: Diagnosis not present

## 2020-03-08 DIAGNOSIS — Z950 Presence of cardiac pacemaker: Secondary | ICD-10-CM | POA: Diagnosis not present

## 2020-03-08 DIAGNOSIS — R400 Somnolence: Secondary | ICD-10-CM | POA: Diagnosis not present

## 2020-03-08 DIAGNOSIS — F0280 Dementia in other diseases classified elsewhere without behavioral disturbance: Secondary | ICD-10-CM | POA: Diagnosis not present

## 2020-03-08 DIAGNOSIS — E1122 Type 2 diabetes mellitus with diabetic chronic kidney disease: Secondary | ICD-10-CM | POA: Diagnosis not present

## 2020-03-08 DIAGNOSIS — I5032 Chronic diastolic (congestive) heart failure: Secondary | ICD-10-CM | POA: Diagnosis not present

## 2020-03-08 DIAGNOSIS — N400 Enlarged prostate without lower urinary tract symptoms: Secondary | ICD-10-CM | POA: Diagnosis not present

## 2020-03-08 DIAGNOSIS — Z85820 Personal history of malignant melanoma of skin: Secondary | ICD-10-CM | POA: Diagnosis not present

## 2020-03-08 DIAGNOSIS — M1A9XX0 Chronic gout, unspecified, without tophus (tophi): Secondary | ICD-10-CM | POA: Diagnosis not present

## 2020-03-08 DIAGNOSIS — I130 Hypertensive heart and chronic kidney disease with heart failure and stage 1 through stage 4 chronic kidney disease, or unspecified chronic kidney disease: Secondary | ICD-10-CM | POA: Diagnosis not present

## 2020-03-08 DIAGNOSIS — Z7982 Long term (current) use of aspirin: Secondary | ICD-10-CM | POA: Diagnosis not present

## 2020-03-08 DIAGNOSIS — E7800 Pure hypercholesterolemia, unspecified: Secondary | ICD-10-CM | POA: Diagnosis not present

## 2020-03-08 DIAGNOSIS — D696 Thrombocytopenia, unspecified: Secondary | ICD-10-CM | POA: Diagnosis not present

## 2020-03-08 DIAGNOSIS — H9193 Unspecified hearing loss, bilateral: Secondary | ICD-10-CM | POA: Diagnosis not present

## 2020-03-08 DIAGNOSIS — E1140 Type 2 diabetes mellitus with diabetic neuropathy, unspecified: Secondary | ICD-10-CM | POA: Diagnosis not present

## 2020-03-08 DIAGNOSIS — E538 Deficiency of other specified B group vitamins: Secondary | ICD-10-CM | POA: Diagnosis not present

## 2020-03-09 ENCOUNTER — Encounter: Payer: Self-pay | Admitting: Oncology

## 2020-03-10 ENCOUNTER — Telehealth: Payer: Self-pay | Admitting: Hematology and Oncology

## 2020-03-10 NOTE — Telephone Encounter (Signed)
Re:  Patient's wife concerns  Patient's wife called needing definitive answers about the etiology of the patient's pancytopenia.  I reviewed Dr Elroy Channel note and the message from Moishe Spice, RN.  Findings were reviewed with the patient.  The patient's wife was overwhelmed with his medical issues including pacemaker, dementia, and the uncertainty of his hematologic diagnosis.  Reassurance was provided.  Multiple questions asked and answered.  I stated that if she needed to know the answer, a bone marrow would be recommended.  A bone marrow would provide her with a definitive diagnosis and prognosis.  I encouraged her to contact Dr Janese Banks on her return.   Lequita Asal, MD

## 2020-03-15 DIAGNOSIS — M79675 Pain in left toe(s): Secondary | ICD-10-CM | POA: Diagnosis not present

## 2020-03-15 DIAGNOSIS — E119 Type 2 diabetes mellitus without complications: Secondary | ICD-10-CM | POA: Diagnosis not present

## 2020-03-15 DIAGNOSIS — B351 Tinea unguium: Secondary | ICD-10-CM | POA: Diagnosis not present

## 2020-03-15 DIAGNOSIS — M79674 Pain in right toe(s): Secondary | ICD-10-CM | POA: Diagnosis not present

## 2020-03-15 DIAGNOSIS — L600 Ingrowing nail: Secondary | ICD-10-CM | POA: Diagnosis not present

## 2020-03-23 ENCOUNTER — Emergency Department: Payer: PPO

## 2020-03-23 ENCOUNTER — Other Ambulatory Visit: Payer: Self-pay

## 2020-03-23 ENCOUNTER — Inpatient Hospital Stay
Admission: EM | Admit: 2020-03-23 | Discharge: 2020-03-27 | DRG: 690 | Disposition: A | Discharge status: Home Health | Payer: PPO | Attending: Internal Medicine | Admitting: Internal Medicine

## 2020-03-23 DIAGNOSIS — K7460 Unspecified cirrhosis of liver: Secondary | ICD-10-CM | POA: Diagnosis present

## 2020-03-23 DIAGNOSIS — Z20822 Contact with and (suspected) exposure to covid-19: Secondary | ICD-10-CM | POA: Diagnosis not present

## 2020-03-23 DIAGNOSIS — N390 Urinary tract infection, site not specified: Secondary | ICD-10-CM

## 2020-03-23 DIAGNOSIS — I5032 Chronic diastolic (congestive) heart failure: Secondary | ICD-10-CM

## 2020-03-23 DIAGNOSIS — N451 Epididymitis: Secondary | ICD-10-CM | POA: Diagnosis present

## 2020-03-23 DIAGNOSIS — N1832 Chronic kidney disease, stage 3b: Secondary | ICD-10-CM | POA: Diagnosis not present

## 2020-03-23 DIAGNOSIS — E785 Hyperlipidemia, unspecified: Secondary | ICD-10-CM | POA: Diagnosis not present

## 2020-03-23 DIAGNOSIS — R509 Fever, unspecified: Secondary | ICD-10-CM | POA: Diagnosis not present

## 2020-03-23 DIAGNOSIS — N3000 Acute cystitis without hematuria: Secondary | ICD-10-CM | POA: Diagnosis present

## 2020-03-23 DIAGNOSIS — R319 Hematuria, unspecified: Secondary | ICD-10-CM

## 2020-03-23 DIAGNOSIS — D696 Thrombocytopenia, unspecified: Secondary | ICD-10-CM | POA: Diagnosis present

## 2020-03-23 DIAGNOSIS — E7800 Pure hypercholesterolemia, unspecified: Secondary | ICD-10-CM | POA: Diagnosis present

## 2020-03-23 DIAGNOSIS — N400 Enlarged prostate without lower urinary tract symptoms: Secondary | ICD-10-CM | POA: Diagnosis present

## 2020-03-23 DIAGNOSIS — N3001 Acute cystitis with hematuria: Principal | ICD-10-CM | POA: Diagnosis present

## 2020-03-23 DIAGNOSIS — L8991 Pressure ulcer of unspecified site, stage 1: Secondary | ICD-10-CM | POA: Diagnosis present

## 2020-03-23 DIAGNOSIS — R7301 Impaired fasting glucose: Secondary | ICD-10-CM

## 2020-03-23 DIAGNOSIS — M25562 Pain in left knee: Secondary | ICD-10-CM

## 2020-03-23 DIAGNOSIS — M25462 Effusion, left knee: Secondary | ICD-10-CM | POA: Diagnosis not present

## 2020-03-23 DIAGNOSIS — F0390 Unspecified dementia without behavioral disturbance: Secondary | ICD-10-CM | POA: Diagnosis present

## 2020-03-23 DIAGNOSIS — R791 Abnormal coagulation profile: Secondary | ICD-10-CM | POA: Diagnosis present

## 2020-03-23 DIAGNOSIS — N179 Acute kidney failure, unspecified: Secondary | ICD-10-CM | POA: Diagnosis not present

## 2020-03-23 DIAGNOSIS — E860 Dehydration: Secondary | ICD-10-CM | POA: Diagnosis present

## 2020-03-23 DIAGNOSIS — Z66 Do not resuscitate: Secondary | ICD-10-CM | POA: Diagnosis present

## 2020-03-23 DIAGNOSIS — I10 Essential (primary) hypertension: Secondary | ICD-10-CM

## 2020-03-23 DIAGNOSIS — E1129 Type 2 diabetes mellitus with other diabetic kidney complication: Secondary | ICD-10-CM | POA: Diagnosis present

## 2020-03-23 DIAGNOSIS — D649 Anemia, unspecified: Secondary | ICD-10-CM | POA: Diagnosis present

## 2020-03-23 DIAGNOSIS — B9620 Unspecified Escherichia coli [E. coli] as the cause of diseases classified elsewhere: Secondary | ICD-10-CM | POA: Diagnosis present

## 2020-03-23 DIAGNOSIS — K7469 Other cirrhosis of liver: Secondary | ICD-10-CM | POA: Diagnosis not present

## 2020-03-23 DIAGNOSIS — E1122 Type 2 diabetes mellitus with diabetic chronic kidney disease: Secondary | ICD-10-CM | POA: Diagnosis not present

## 2020-03-23 DIAGNOSIS — A419 Sepsis, unspecified organism: Secondary | ICD-10-CM

## 2020-03-23 DIAGNOSIS — Z85828 Personal history of other malignant neoplasm of skin: Secondary | ICD-10-CM

## 2020-03-23 DIAGNOSIS — M11261 Other chondrocalcinosis, right knee: Secondary | ICD-10-CM | POA: Diagnosis not present

## 2020-03-23 DIAGNOSIS — M1712 Unilateral primary osteoarthritis, left knee: Secondary | ICD-10-CM | POA: Diagnosis present

## 2020-03-23 DIAGNOSIS — Z8249 Family history of ischemic heart disease and other diseases of the circulatory system: Secondary | ICD-10-CM

## 2020-03-23 DIAGNOSIS — Z8619 Personal history of other infectious and parasitic diseases: Secondary | ICD-10-CM | POA: Diagnosis not present

## 2020-03-23 DIAGNOSIS — Z950 Presence of cardiac pacemaker: Secondary | ICD-10-CM | POA: Diagnosis not present

## 2020-03-23 DIAGNOSIS — I129 Hypertensive chronic kidney disease with stage 1 through stage 4 chronic kidney disease, or unspecified chronic kidney disease: Secondary | ICD-10-CM | POA: Diagnosis present

## 2020-03-23 DIAGNOSIS — L89301 Pressure ulcer of unspecified buttock, stage 1: Secondary | ICD-10-CM | POA: Diagnosis not present

## 2020-03-23 DIAGNOSIS — D61818 Other pancytopenia: Secondary | ICD-10-CM | POA: Diagnosis not present

## 2020-03-23 DIAGNOSIS — J90 Pleural effusion, not elsewhere classified: Secondary | ICD-10-CM | POA: Diagnosis not present

## 2020-03-23 DIAGNOSIS — R102 Pelvic and perineal pain: Secondary | ICD-10-CM | POA: Diagnosis not present

## 2020-03-23 DIAGNOSIS — B964 Proteus (mirabilis) (morganii) as the cause of diseases classified elsewhere: Secondary | ICD-10-CM | POA: Diagnosis present

## 2020-03-23 DIAGNOSIS — R8290 Unspecified abnormal findings in urine: Secondary | ICD-10-CM | POA: Diagnosis not present

## 2020-03-23 DIAGNOSIS — M25569 Pain in unspecified knee: Secondary | ICD-10-CM

## 2020-03-23 DIAGNOSIS — N200 Calculus of kidney: Secondary | ICD-10-CM | POA: Diagnosis not present

## 2020-03-23 DIAGNOSIS — I441 Atrioventricular block, second degree: Secondary | ICD-10-CM | POA: Diagnosis present

## 2020-03-23 DIAGNOSIS — R531 Weakness: Secondary | ICD-10-CM | POA: Diagnosis not present

## 2020-03-23 DIAGNOSIS — M11262 Other chondrocalcinosis, left knee: Secondary | ICD-10-CM | POA: Diagnosis not present

## 2020-03-23 LAB — CBC WITH DIFFERENTIAL/PLATELET
Abs Immature Granulocytes: 0.03 10*3/uL (ref 0.00–0.07)
Basophils Absolute: 0 10*3/uL (ref 0.0–0.1)
Basophils Relative: 0 %
Eosinophils Absolute: 0 10*3/uL (ref 0.0–0.5)
Eosinophils Relative: 0 %
HCT: 29.4 % — ABNORMAL LOW (ref 39.0–52.0)
Hemoglobin: 11 g/dL — ABNORMAL LOW (ref 13.0–17.0)
Immature Granulocytes: 0 %
Lymphocytes Relative: 7 %
Lymphs Abs: 0.5 10*3/uL — ABNORMAL LOW (ref 0.7–4.0)
MCH: 37.4 pg — ABNORMAL HIGH (ref 26.0–34.0)
MCHC: 37.4 g/dL — ABNORMAL HIGH (ref 30.0–36.0)
MCV: 100 fL (ref 80.0–100.0)
Monocytes Absolute: 0.7 10*3/uL (ref 0.1–1.0)
Monocytes Relative: 9 %
Neutro Abs: 6.6 10*3/uL (ref 1.7–7.7)
Neutrophils Relative %: 84 %
Platelets: 78 10*3/uL — ABNORMAL LOW (ref 150–400)
RBC: 2.94 MIL/uL — ABNORMAL LOW (ref 4.22–5.81)
RDW: 13.6 % (ref 11.5–15.5)
WBC: 7.9 10*3/uL (ref 4.0–10.5)
nRBC: 0 % (ref 0.0–0.2)

## 2020-03-23 LAB — URINALYSIS, COMPLETE (UACMP) WITH MICROSCOPIC
Bilirubin Urine: NEGATIVE
Glucose, UA: NEGATIVE mg/dL
Ketones, ur: NEGATIVE mg/dL
Nitrite: NEGATIVE
Protein, ur: 100 mg/dL — AB
Specific Gravity, Urine: 1.024 (ref 1.005–1.030)
WBC, UA: >50 WBC/hpf — ABNORMAL HIGH (ref 0–5)
pH: 5 (ref 5.0–8.0)

## 2020-03-23 LAB — RESP PANEL BY RT-PCR (FLU A&B, COVID) ARPGX2
Influenza A by PCR: NEGATIVE
Influenza B by PCR: NEGATIVE
SARS Coronavirus 2 by RT PCR: NEGATIVE

## 2020-03-23 LAB — COMPREHENSIVE METABOLIC PANEL
ALT: 19 U/L (ref 0–44)
AST: 43 U/L — ABNORMAL HIGH (ref 15–41)
Albumin: 3.2 g/dL — ABNORMAL LOW (ref 3.5–5.0)
Alkaline Phosphatase: 55 U/L (ref 38–126)
Anion gap: 7 (ref 5–15)
BUN: 33 mg/dL — ABNORMAL HIGH (ref 8–23)
CO2: 24 mmol/L (ref 22–32)
Calcium: 8.8 mg/dL — ABNORMAL LOW (ref 8.9–10.3)
Chloride: 102 mmol/L (ref 98–111)
Creatinine, Ser: 1.45 mg/dL — ABNORMAL HIGH (ref 0.61–1.24)
GFR, Estimated: 52 mL/min — ABNORMAL LOW (ref >60)
Glucose, Bld: 133 mg/dL — ABNORMAL HIGH (ref 70–99)
Potassium: 4.8 mmol/L (ref 3.5–5.1)
Sodium: 133 mmol/L — ABNORMAL LOW (ref 135–145)
Total Bilirubin: 3.2 mg/dL — ABNORMAL HIGH (ref 0.3–1.2)
Total Protein: 5.8 g/dL — ABNORMAL LOW (ref 6.5–8.1)

## 2020-03-23 LAB — PHOSPHORUS: Phosphorus: 2.8 mg/dL (ref 2.5–4.6)

## 2020-03-23 LAB — TROPONIN I (HIGH SENSITIVITY): Troponin I (High Sensitivity): 11 ng/L (ref <18)

## 2020-03-23 LAB — CK: Total CK: 295 U/L (ref 49–397)

## 2020-03-23 LAB — LACTIC ACID, PLASMA
Lactic Acid, Venous: 3.2 mmol/L (ref 0.5–1.9)
Lactic Acid, Venous: 2.4 mmol/L (ref 0.5–1.9)

## 2020-03-23 LAB — PROTIME-INR
INR: 1.5 — ABNORMAL HIGH (ref 0.8–1.2)
Prothrombin Time: 17.4 seconds — ABNORMAL HIGH (ref 11.4–15.2)

## 2020-03-23 LAB — MAGNESIUM: Magnesium: 1.7 mg/dL (ref 1.7–2.4)

## 2020-03-23 LAB — APTT: aPTT: 35 seconds (ref 24–36)

## 2020-03-23 LAB — CBG MONITORING, ED: Glucose-Capillary: 108 mg/dL — ABNORMAL HIGH (ref 70–99)

## 2020-03-23 LAB — AMMONIA: Ammonia: 27 umol/L (ref 9–35)

## 2020-03-23 MED ORDER — LACTATED RINGERS IV SOLN: INTRAVENOUS | Status: DC

## 2020-03-23 MED ORDER — SENNA 8.6 MG PO TABS
1.0000 | ORAL_TABLET | Freq: Two times a day (BID) | ORAL | Status: DC
  Administered 2020-03-24 – 2020-03-27 (×4): 8.6 mg via ORAL
  Filled 2020-03-23 (×5): qty 1

## 2020-03-23 MED ORDER — SODIUM CHLORIDE 0.9 % IV SOLN
2.0000 g | Freq: Two times a day (BID) | INTRAVENOUS | Status: DC
  Administered 2020-03-24 – 2020-03-25 (×3): 2 g via INTRAVENOUS
  Filled 2020-03-23: qty 1.94
  Filled 2020-03-23 (×3): qty 2

## 2020-03-23 MED ORDER — HYDROCODONE-ACETAMINOPHEN 5-325 MG PO TABS: 1.0000 | ORAL_TABLET | ORAL | Status: DC | PRN

## 2020-03-23 MED ORDER — DIVALPROEX SODIUM 125 MG PO DR TAB
125.0000 mg | DELAYED_RELEASE_TABLET | Freq: Every day | ORAL | Status: DC
  Administered 2020-03-24 – 2020-03-26 (×4): 125 mg via ORAL
  Filled 2020-03-23 (×5): qty 1

## 2020-03-23 MED ORDER — ACETAMINOPHEN 650 MG RE SUPP: 650.0000 mg | Freq: Four times a day (QID) | RECTAL | Status: DC | PRN

## 2020-03-23 MED ORDER — METRONIDAZOLE IN NACL 5-0.79 MG/ML-% IV SOLN
500.0000 mg | Freq: Once | INTRAVENOUS | Status: AC
  Administered 2020-03-23: 500 mg via INTRAVENOUS
  Filled 2020-03-23: qty 100

## 2020-03-23 MED ORDER — SODIUM CHLORIDE 0.9 % IV BOLUS (SEPSIS)
1000.0000 mL | Freq: Once | INTRAVENOUS | Status: AC
  Administered 2020-03-23: 1000 mL via INTRAVENOUS

## 2020-03-23 MED ORDER — VANCOMYCIN HCL 1250 MG/250ML IV SOLN
1250.0000 mg | INTRAVENOUS | Status: DC
  Filled 2020-03-23: qty 250

## 2020-03-23 MED ORDER — POLYETHYLENE GLYCOL 3350 17 G PO PACK: 17.0000 g | PACK | Freq: Every day | ORAL | Status: DC | PRN

## 2020-03-23 MED ORDER — METRONIDAZOLE IN NACL 5-0.79 MG/ML-% IV SOLN
500.0000 mg | Freq: Three times a day (TID) | INTRAVENOUS | Status: DC
  Administered 2020-03-24: 500 mg via INTRAVENOUS
  Filled 2020-03-23 (×2): qty 100

## 2020-03-23 MED ORDER — SODIUM CHLORIDE 0.9 % IV SOLN
2.0000 g | Freq: Once | INTRAVENOUS | Status: AC
  Administered 2020-03-23: 2 g via INTRAVENOUS
  Filled 2020-03-23: qty 2

## 2020-03-23 MED ORDER — ACETAMINOPHEN 325 MG PO TABS
650.0000 mg | ORAL_TABLET | Freq: Four times a day (QID) | ORAL | Status: DC | PRN
  Administered 2020-03-26: 650 mg via ORAL
  Filled 2020-03-23: qty 2

## 2020-03-23 MED ORDER — DOCUSATE SODIUM 100 MG PO CAPS
100.0000 mg | ORAL_CAPSULE | Freq: Two times a day (BID) | ORAL | Status: DC
  Administered 2020-03-24 – 2020-03-27 (×4): 100 mg via ORAL
  Filled 2020-03-23 (×5): qty 1

## 2020-03-23 MED ORDER — ACETAMINOPHEN 500 MG PO TABS
1000.0000 mg | ORAL_TABLET | Freq: Once | ORAL | Status: AC
  Administered 2020-03-23: 1000 mg via ORAL
  Filled 2020-03-23: qty 2

## 2020-03-23 MED ORDER — VANCOMYCIN HCL 2000 MG/400ML IV SOLN
2000.0000 mg | Freq: Once | INTRAVENOUS | Status: AC
  Administered 2020-03-23: 2000 mg via INTRAVENOUS
  Filled 2020-03-23: qty 400

## 2020-03-23 MED ORDER — SODIUM CHLORIDE 0.9 % IV SOLN
75.0000 mL/h | INTRAVENOUS | Status: DC
  Administered 2020-03-24: 75 mL/h via INTRAVENOUS

## 2020-03-23 MED ORDER — INSULIN ASPART 100 UNIT/ML ~~LOC~~ SOLN
0.0000 [IU] | SUBCUTANEOUS | Status: DC
  Administered 2020-03-24: 1 [IU] via SUBCUTANEOUS
  Filled 2020-03-23: qty 1

## 2020-03-23 MED ORDER — VANCOMYCIN HCL IN DEXTROSE 1-5 GM/200ML-% IV SOLN
1000.0000 mg | Freq: Once | INTRAVENOUS | Status: DC
  Filled 2020-03-23: qty 200

## 2020-03-23 NOTE — ED Notes (Signed)
Chief Complaint  Patient presents with  . Weakness    Pt presenting from home via EMS. Per EMS report pt has been having increasing weakness for the past 3-4 days. Usually able to walk with cane but reports he is now unable to walk   Pt presenting with the above complaint. Per EMS report pt's wife stated she noticed dark colored urine but was unsure if this was related to medications he is taking (unable to specify which medications). Pt AAOx4. Placed in gown, placed on monitor. Febrile but other VSS.

## 2020-03-23 NOTE — ED Notes (Signed)
CBC reordered for lab to run at this time. Sent with initial bloodwork.

## 2020-03-23 NOTE — H&P (Signed)
Dean Lynch TSV:[id number] DOB: [birth year] DOA: 03/23/2020     PCP: Toni Arthurs, NP   Outpatient Specialists:      Oncology  Dr. Janese Banks    Patient arrived to ER on 03/23/20 at [facility] Referred by Attending Lavonia Drafts, MD   Patient coming from: home Lives   With family    Chief Complaint:  Chief Complaint  Patient presents with  . Weakness    Pt presenting from home via EMS. Per EMS report pt has been having increasing weakness for the past 3-4 days. Usually able to walk with cane but reports he is now unable to walk    HPI: Dean Lynch is a 70 y.o. male with medical history significant of pancytopenia,    hypertension, hyperlipidemia and type 2 diabetes, BPH second-degree AV block,  CKD stage IIIb,  dCHF, pacemaker placement, dementia   Presented with   3-4 days of worsening weakness, also bed could not stand up from the chair Also noted some tremors in his upper extremities.  Patient stated that he feels like both legs legs just feeling too weak for him to stand up.  Usually he walks with a cane.  No recent fall or injury.  Otherwise no abdominal pain no nausea no vomiting no diarrhea EMS had to be called.   Family noted that his urine has been turning dark. Followed by Oncology for Pancytopenia    Infectious risk factors:  Reports  fever,     Has been vaccinated against COVID  and boosted   Initial COVID TEST  NEGATIVE Lab Results  Component Value Date   SARSCOV2NAA NEGATIVE 03/23/2020   [place] NEGATIVE 02/19/2020   [place] NEGATIVE 02/15/2020   [place] NEGATIVE 12/23/2019     Regarding pertinent Chronic problems:     HTN on Coreg,  hct/lisinopril   chronic CHF diastolic  - last echo 9233 Grade I diastolic dysfunction (impaired  relaxation).    DM 2 -  Lab Results  Component Value Date   HGBA1C 5.0 12/23/2019   , diet controlled   CKD stage IIIb- baseline Cr 1.4 Estimated Creatinine Clearance: 49.6 mL/min (A) (by C-G  formula based on SCr of 1.45 mg/dL (H)).  Lab Results  Component Value Date   CREATININE 1.45 (H) 03/23/2020   CREATININE 1.46 (H) 03/07/2020   CREATININE 1.84 (H) 02/20/2020     Chronic anemia - baseline hg Hemoglobin & Hematocrit  Recent Labs    02/20/20 0441 03/07/20 0952 03/23/20 1919  HGB 11.8* 10.9* 11.0*    While in ER: Noted to be febrile in emergency department start on broad-spectrum antibiotics cefepime vancomycin and metronidazole Lactic acid noted to be elevated 3.2 With x-ray showing questionable infiltrate CT renal protocol showed cirrhosis with splenomegaly did not confirm pneumonia No ureteral stones to suggest obstruction  ED Triage Vitals  Enc Vitals Group     BP 03/23/20 1858 (!) 133/53     Pulse Rate 03/23/20 1858 82     Resp 03/23/20 1858 13     Temp 03/23/20 1858 (!) 103 F (39.4 C)     Temp Source 03/23/20 1858 Oral     SpO2 03/23/20 1858 98 %     Weight 03/23/20 1902 180 lb (81.6 kg)     Height 03/23/20 1902 5\' 10"  (1.778 m)     Head Circumference --      Peak Flow --      Pain Score 03/23/20 1902 0     Pain  Loc --      Pain Edu? --      Excl. in [place]? --   TMAX(24)@     _________________________________________ Significant initial  Findings: Abnormal Labs Reviewed  COMPREHENSIVE METABOLIC PANEL - Abnormal; Notable for the following components:      Result Value   Sodium 133 (*)    Glucose, Bld 133 (*)    BUN 33 (*)    Creatinine, Ser 1.45 (*)    Calcium 8.8 (*)    Total Protein 5.8 (*)    Albumin 3.2 (*)    AST 43 (*)    Total Bilirubin 3.2 (*)    GFR, Estimated 52 (*)    All other components within normal limits  LACTIC ACID, PLASMA - Abnormal; Notable for the following components:   Lactic Acid, Venous 3.2 (*)    All other components within normal limits  PROTIME-INR - Abnormal; Notable for the following components:   Prothrombin Time 17.4 (*)    INR 1.5 (*)    All other components within normal limits  URINALYSIS, COMPLETE  (UACMP) WITH MICROSCOPIC - Abnormal; Notable for the following components:   Color, Urine AMBER (*)    APPearance CLOUDY (*)    Hgb urine dipstick LARGE (*)    Protein, ur 100 (*)    Leukocytes,Ua MODERATE (*)    WBC, UA >50 (*)    Bacteria, UA FEW (*)    All other components within normal limits  CBC WITH DIFFERENTIAL/PLATELET - Abnormal; Notable for the following components:   RBC 2.94 (*)    Hemoglobin 11.0 (*)    HCT 29.4 (*)    MCH 37.4 (*)    MCHC 37.4 (*)    Platelets 78 (*)    Lymphs Abs 0.5 (*)    All other components within normal limits   ____________________________________________ Ordered   CXR - Possible infiltrate  CTabd/pelvis renal study  - Nonobstructing 3 mm right renal stone,  Cirrhosis. Small left pleural effusion with minimal left lower lobe atelectasis.   _________________________ Troponin 11 ECG: Ordered Personally reviewed by me showing: HR : 80  Rhythm:  Paced   no evidence of ischemic changes    The recent clinical data is shown below. Vitals:   03/23/20 1858 03/23/20 1902 03/23/20 1930  BP: (!) 133/53  (!) 153/67  Pulse: 82  84  Resp: 13  16  Temp: (!) 103 F (39.4 C)    TempSrc: Oral    SpO2: 98%  93%  Weight:  81.6 kg   Height:  5\' 10"  (1.778 m)    WBC     Component Value Date/Time   WBC 7.9 03/23/2020 1919   LYMPHSABS 0.5 (L) 03/23/2020 1919   MONOABS 0.7 03/23/2020 1919   EOSABS 0.0 03/23/2020 1919   BASOSABS 0.0 03/23/2020 1919    Lactic Acid, Venous    Component Value Date/Time   LATICACIDVEN 3.2 ([place]) 03/23/2020 1916     Procalcitonin   Ordered     UA  possible UTI    Urine analysis:    Component Value Date/Time   COLORURINE AMBER (A) 03/23/2020 1916   APPEARANCEUR CLOUDY (A) 03/23/2020 1916   APPEARANCEUR Clear 01/20/2019 1106   LABSPEC 1.024 03/23/2020 1916   PHURINE 5.0 03/23/2020 1916   GLUCOSEU NEGATIVE 03/23/2020 1916   HGBUR LARGE (A) 03/23/2020 1916   BILIRUBINUR NEGATIVE 03/23/2020 1916    BILIRUBINUR Negative 01/20/2019 1106   KETONESUR NEGATIVE 03/23/2020 1916   PROTEINUR 100 (A) 03/23/2020  1916   NITRITE NEGATIVE 03/23/2020 1916   LEUKOCYTESUR MODERATE (A) 03/23/2020 1916    Results for orders placed or performed during the hospital encounter of 03/23/20  Resp Panel by RT-PCR (Flu A&B, Covid) Nasopharyngeal Swab     Status: None   Collection Time: 03/23/20  7:16 PM   Specimen: Nasopharyngeal Swab; Nasopharyngeal(NP) swabs in vial transport medium  Result Value Ref Range Status   SARS Coronavirus 2 by RT PCR NEGATIVE NEGATIVE Final         Influenza A by PCR NEGATIVE NEGATIVE Final   Influenza B by PCR NEGATIVE NEGATIVE Final          _______________________________________________ Hospitalist was called for admission for UTI and elevated lactic acid, new diagnosis of cirrhosis   The following Work up has been ordered so far:  Orders Placed This Encounter  Procedures  . Resp Panel by RT-PCR (Flu A&B, Covid) Nasopharyngeal Swab  . Blood Culture (routine x 2)  . Urine culture  . DG Chest Port 1 View  . CT Renal Stone Study  . Comprehensive metabolic panel  . Lactic acid, plasma  . Protime-INR  . APTT  . Urinalysis, Complete w Microscopic  . CBC with Differential  . Diet NPO time specified  . Cardiac monitoring  . Document height and weight  . Assess and Document Glasgow Coma Scale  . Document vital signs within 1-hour of fluid bolus completion. Notify provider of abnormal vital signs despite fluid resuscitation.  . DO NOT delay antibiotics if unable to obtain blood culture.  . Refer to Sidebar Report: Sepsis Sidebar ED/IP  . Notify provider for difficulties obtaining IV access.  . Insert peripheral IV x 2  . Initiate Carrier Fluid Protocol  . Code Sepsis activation.  This occurs automatically when order is signed and prioritizes pharmacy, lab, and radiology services for STAT collections and interventions.  If [facility] downtime, call [facility] [phone]) to  activate Code Sepsis.  . Consult to hospitalist  . Consult to hospitalist  . Pulse oximetry, continuous  . EKG 12-Lead  . ED EKG 12-Lead  . Insert peripheral IV    Following Medications were ordered in ER: Medications  lactated ringers infusion (has no administration in time range)  metroNIDAZOLE (FLAGYL) IVPB 500 mg (has no administration in time range)  vancomycin (VANCOCIN) IVPB 1000 mg/200 mL premix (has no administration in time range)  acetaminophen (TYLENOL) tablet 1,000 mg (has no administration in time range)  sodium chloride 0.9 % bolus 1,000 mL (1,000 mLs Intravenous New Bag/Given 03/23/20 1952)  ceFEPIme (MAXIPIME) 2 g in sodium chloride 0.9 % 100 mL IVPB (0 g Intravenous Stopped 03/23/20 2028)        Consult Orders  (From admission, onward)         Start     Ordered   03/23/20 2049  Consult to hospitalist  Once       Provider:  (Not yet assigned)  Question Answer Comment  Place call to: [phone]   Reason for Consult Admit   Diagnosis/Clinical Info for Consult: sepsis      03/23/20 2048   03/23/20 2026  Consult to hospitalist  Once       Provider:  (Not yet assigned)  Question Answer Comment  Place call to: [phone]   Reason for Consult Admit   Diagnosis/Clinical Info for Consult: sepsis      03/23/20 2025            OTHER Significant initial  Findings:  labs showing:    Recent Labs  Lab 03/23/20 1919  NA 133*  K 4.8  CO2 24  GLUCOSE 133*  BUN 33*  CREATININE 1.45*  CALCIUM 8.8*   Cr   Stable,  Lab Results  Component Value Date   CREATININE 1.45 (H) 03/23/2020   CREATININE 1.46 (H) 03/07/2020   CREATININE 1.84 (H) 02/20/2020    Recent Labs  Lab 03/23/20 1919  AST 43*  ALT 19  ALKPHOS 55  BILITOT 3.2*  PROT 5.8*  ALBUMIN 3.2*   Lab Results  Component Value Date   CALCIUM 8.8 (L) 03/23/2020      Plt: Lab Results  Component Value Date   PLT 78 (L) 03/23/2020   INR 1.5      Recent Labs  Lab 03/23/20 1919  WBC 7.9   NEUTROABS 6.6  HGB 11.0*  HCT 29.4*  MCV 100.0  PLT 78*    HG/HCT  stable,       Component Value Date/Time   HGB 11.0 (L) 03/23/2020 1919   HCT 29.4 (L) 03/23/2020 1919   MCV 100.0 03/23/2020 1919     No results for input(s): LIPASE, AMYLASE in the last 168 hours. Recent Labs  Lab 03/23/20 2117  AMMONIA 27     Cardiac Panel (last 3 results) Recent Labs    03/23/20 1916  CKTOTAL 295     BNP (last 3 results) Recent Labs    12/23/19 1851 02/19/20 0817  BNP 220.7* 23.3    DM  labs:  HbA1C: Recent Labs    12/23/19 1537  HGBA1C 5.0     CBG (last 3)  Recent Labs    03/23/20 2224  GLUCAP 108*      Cultures: No results found for: SDES, SPECREQUEST, CULT, REPTSTATUS   Radiological Exams on Admission: DG Chest Port 1 View  Result Date: 03/23/2020 CLINICAL DATA:  Weakness. EXAM: PORTABLE CHEST 1 VIEW COMPARISON:  December 23, 2019. FINDINGS: The heart size and mediastinal contours are within normal limits. No pneumothorax is noted. Right lung is clear. Left-sided pacemaker is noted with leads in grossly good position. Mild left basilar atelectasis or infiltrate is noted with small left pleural effusion. The visualized skeletal structures are unremarkable. IMPRESSION: Mild left basilar atelectasis or infiltrate is noted with small left pleural effusion. Electronically Signed   By: Marijo Conception M.D.   On: 03/23/2020 19:44   CT Renal Stone Study  Result Date: 03/23/2020 CLINICAL DATA:  Dark urine, hematuria EXAM: CT ABDOMEN AND PELVIS WITHOUT CONTRAST TECHNIQUE: Multidetector CT imaging of the abdomen and pelvis was performed following the standard protocol without IV contrast. COMPARISON:  02/19/2020 FINDINGS: Lower chest: Small left pleural effusion with minimal left lower lobe atelectasis. No acute airspace disease. Cardiac pacer identified. Hepatobiliary: Calcified gallstones are identified without cholecystitis. Slight nodularity of the liver capsule unchanged,  which may reflect cirrhosis. No focal liver abnormalities on this unenhanced exam. No biliary dilation. Pancreas: Unremarkable. No pancreatic ductal dilatation or surrounding inflammatory changes. Spleen: Stable splenomegaly.  No focal abnormality. Adrenals/Urinary Tract: Stable nonobstructing 3 mm right renal calculus. Left kidney is unremarkable. The adrenals and bladder are normal. Stomach/Bowel: No bowel obstruction or ileus. Normal appendix right lower quadrant. No bowel wall thickening or inflammatory change. Vascular/Lymphatic: Aortic atherosclerosis. No enlarged abdominal or pelvic lymph nodes. Reproductive: Prostate is enlarged but stable. Central prostate calcifications again noted. Other: No free fluid or free gas.  No abdominal wall hernia. Musculoskeletal: No acute or destructive bony lesions. Reconstructed  images demonstrate no additional findings. IMPRESSION: 1. Nonobstructing 3 mm right renal calculus. 2. Stable hepatic capsular nodularity consistent with cirrhosis. 3. Stable splenomegaly. 4. Cholelithiasis without cholecystitis. 5. Small left pleural effusion with minimal left lower lobe atelectasis. 6. Stable enlarged prostate. 7.  Aortic Atherosclerosis (ICD10-I70.0). Electronically Signed   By: Randa Ngo M.D.   On: 03/23/2020 20:44   _______________________________________________________________________________________________________ Latest  Blood pressure (!) 153/67, pulse 84, temperature (!) 103 F (39.4 C), temperature source Oral, resp. rate 16, height 5\' 10"  (1.778 m), weight 81.6 kg, SpO2 93 %.   Review of Systems:    Pertinent positives include:  chills, fatigue,  Constitutional:  No weight loss, night sweats, Fevers,  weight loss  HEENT:  No headaches, Difficulty swallowing,Tooth/dental problems,Sore throat,  No sneezing, itching, ear ache, nasal congestion, post nasal drip,  Cardio-vascular:  No chest pain, Orthopnea, PND, anasarca, dizziness, palpitations.no  Bilateral lower extremity swelling  GI:  No heartburn, indigestion, abdominal pain, nausea, vomiting, diarrhea, change in bowel habits, loss of appetite, melena, blood in stool, hematemesis Resp:  no shortness of breath at rest. No dyspnea on exertion, No excess mucus, no productive cough, No non-productive cough, No coughing up of blood.No change in color of mucus.No wheezing. Skin:  no rash or lesions. No jaundice GU:  no dysuria, change in color of urine, no urgency or frequency. No straining to urinate.  No flank pain.  Musculoskeletal:  No joint pain or no joint swelling. No decreased range of motion. No back pain.  Psych:  No change in mood or affect. No depression or anxiety. No memory loss.  Neuro: no localizing neurological complaints, no tingling, no weakness, no double vision, no gait abnormality, no slurred speech, no confusion  All systems reviewed and apart from [place] all are negative _______________________________________________________________________________________________ Past Medical History:   Past Medical History:  Diagnosis Date  . BPH (benign prostatic hyperplasia)   . Diabetes mellitus without complication ([place])   . Gout   . Hypercholesteremia   . Hypertension   . Skin cancer 2017   melanoa- removed at [facility]  . Thrombocytopenia ([place])       Past Surgical History:  Procedure Laterality Date  . CATARACT EXTRACTION W/PHACO Right 09/27/2015   Procedure: CATARACT EXTRACTION PHACO AND INTRAOCULAR LENS PLACEMENT (IOC);  Surgeon: Leandrew Koyanagi, MD;  Location: [facility];  Service: Ophthalmology;  Laterality: Right;  DIABETIC  . KNEE ARTHROSCOPY    . PACEMAKER IMPLANT N/A 12/24/2019   Procedure: PACEMAKER IMPLANT;  Surgeon: Marzetta Board, MD;  Location: [facility];  Service: Cardiovascular;  Laterality: N/A;    Social History:        reports that he has never smoked. He has never used smokeless tobacco. He reports that he does not  drink alcohol and does not use drugs.    Family History:   Family History  Problem Relation Age of Onset  . Coronary artery disease Mother   . Polycythemia Mother   . Macular degeneration Mother   . Dementia Mother   . Coronary artery disease Father    ______________________________________________________________________________________________ Allergies: No Known Allergies   Prior to Admission medications   Medication Sig Start Date End Date Taking? Authorizing Provider  aspirin EC 81 MG tablet Take 81 mg by mouth daily.  02/25/18   [provider]  carvedilol (COREG) 6.25 MG tablet Take 1 tablet (6.25 mg total) by mouth 2 (two) times daily with a meal. 12/25/19 01/24/20  Wyvonnia Dusky, MD  Cholecalciferol (VITAMIN D3) 50 MCG (2000 UT) capsule Take 2,000 Units by mouth 2 (two) times daily.    [provider]  divalproex (DEPAKOTE) 125 MG DR tablet Take 125 mg by mouth at bedtime. 02/17/20   [provider]  divalproex (DEPAKOTE) 250 MG DR tablet Take 250 mg by mouth at bedtime. Patient not taking: Reported on 03/07/2020 02/18/20   [provider]  dutasteride (AVODART) 0.5 MG capsule Take 0.5 mg by mouth every morning.    [provider]  febuxostat (ULORIC) 40 MG tablet Take 40 mg by mouth daily.  02/25/18   [provider]  lisinopril-hydrochlorothiazide (PRINZIDE,ZESTORETIC) 20-12.5 MG tablet Take 1 tablet by mouth every morning.    [provider]  magnesium oxide (MAG-OX) 400 MG tablet Take 400 mg by mouth 2 (two) times daily.    [provider]  Omega-3 Fatty Acids (FISH OIL) 1000 MG CAPS Take 1,000 mg by mouth daily.    [provider]  QUEtiapine (SEROQUEL) 25 MG tablet Take 1 tablet (25 mg total) by mouth 2 (two) times daily. Take one at bedtime plus one daily as needed only for agitation Patient not taking: Reported on 03/07/2020 02/16/20   Clapacs, Madie Reno, MD  traZODone (DESYREL) 50 MG tablet Take  50 mg by mouth at bedtime. 12/01/19   [provider]    ___________________________________________________________________________________________________ Physical Exam: Vitals with BMI 03/23/2020 03/23/2020 03/23/2020  Height - 5\' 10"  -  Weight - 180 lbs -  BMI - 62.13 -  Systolic 086 - 578  Diastolic 67 - 53  Pulse 84 - 82      1. General:  in No  Acute distress   Chronically ill -appearing 2. Psychological: Alert and Oriented 3. Head/ENT:   Dry Mucous Membranes                          Head Non traumatic, neck supple                            Poor Dentition 4. SKIN:   decreased Skin turgor,  Skin clean Dry and intact no rash 5. Heart: Regular rate and rhythm no Murmur, no Rub or gallop 6. Lungs:  no wheezes or crackles   7. Abdomen: Soft,  non-tender, Non distended  bowel sounds present 8. Lower extremities: no clubbing, cyanosis, no edema 9. Neurologically Grossly intact, moving all 4 extremities equally   10. MSK: Normal range of motion    Chart has been reviewed  ______________________________________________________________________________________________  Assessment/Plan  70 y.o. male with medical history significant of pancytopenia,    hypertension, hyperlipidemia and type 2 diabetes, BPH second-degree AV block,  CKD stage IIIb,  dCHF, pacemaker placement, dementia Admitted for UTI and elevated lactic acid, new diagnosis of cirrhosis  Present on Admission:  Fever - unclear etiology for now given elevated lactic acid will continue with broad base antibiotics, blood and urine cult pending  . UTI (urinary tract infection) -  - treat with IV antibiotics hx of ESBL will treat with Meropenem       await results of urine culture and adjust antibiotic coverage as needed  . Type II diabetes mellitus with renal manifestations (HCC) -  - Order Sensitive  SSI   -  check TSH and HgA1C    . 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  Hypercholesterolemia - restart home meds when  able  . Essential hypertension - hold home meds, given soft BP in ER  . Dehydration - will rehydrate  . Chronic diastolic CHF (congestive heart failure) (HCC) - - currently appears to be slightly on the dry side,   carefuly follow fluid status and Cr   Cirrhosis - new diagnosis, denies etOh use, will obtain hepatitis serologies Will need Gi follow up MELD-Na score: 22 at 03/23/2020  7:19 PM     Other plan as per orders.  DVT prophylaxis:  SCD      Code Status:    Code Status: Prior FULL CODE as per patient   I had personally discussed CODE STATUS with patient   Family Communication:   Family not at  Bedside   Disposition Plan:     To home once workup is complete and patient is stable   Following barriers for discharge:                            Electrolytes corrected      Afebrile,   transition to PO antibiotics                             Will need to be able to tolerate PO                            Will likely need home health, home O2, set up                        Would benefit from PT/OT eval prior to DC  Ordered                   Swallow eval - SLP ordered                   Diabetes care coordinator                   Transition of care consulted                   Nutrition    consulted                    Consults called: none    Admission status:  ED Disposition    ED Disposition Condition [place]: [facility] [[id number]]  Level of Care: Med-Surg [16]  Covid Evaluation: Confirmed COVID Negative  Diagnosis: UTI (urinary tract infection) [381829]  Admitting Physician: Toy Baker [[id number]]  Attending Physician: Toy Baker [[id number]]        Obs   Level of care     tele  For   24H     Lab Results  Component Value Date   [place] NEGATIVE 03/23/2020     Precautions: admitted as   Covid Negative  PPE: Used by the provider:   N95  eye Goggles,  Gloves    Rayana Geurin 03/23/2020, 10:32 PM    [facility]     after 2 AM please page floor coverage PA If 7AM-7PM, please contact the day team taking care of the patient using [email or web address]   Patient was evaluated in the context of the global COVID-19 pandemic, which necessitated consideration that the patient might be at risk for infection with the SARS-CoV-2 virus that causes COVID-19. Institutional  protocols and algorithms that pertain to the evaluation of patients at risk for COVID-19 are in a state of rapid change based on information released by regulatory bodies including the CDC and federal and state organizations. These policies and algorithms were followed during the patient's care.

## 2020-03-23 NOTE — Progress Notes (Signed)
CODE SEPSIS - PHARMACY COMMUNICATION  **Broad Spectrum Antibiotics should be administered within 1 hour of Sepsis diagnosis**  Time Code Sepsis Called/Page Received: 1908  Antibiotics Ordered: cefepime / vancomycin / metronidazole  Time of 1st antibiotic administration: 1952  Additional action taken by pharmacy: Messaged EDP at [facility] regarding ordering antibiotics   Benita Gutter 03/23/2020  7:13 PM

## 2020-03-23 NOTE — Consult Note (Signed)
PHARMACY -  BRIEF ANTIBIOTIC NOTE   Pharmacy has received consult(s) for cefepime and vancomycin from an ED provider. Patient is also ordered metronidazole. The patient's profile has been reviewed for ht/wt/allergies/indication/available labs.    One time order(s) placed for  --Cefepime 2 g IV --Vancomycin 1 g IV  Further antibiotics/pharmacy consults should be ordered by admitting physician if indicated.                       Thank you, Benita Gutter 03/23/2020  7:53 PM

## 2020-03-23 NOTE — ED Provider Notes (Signed)
[facility] Emergency Department Provider Note   ____________________________________________    I have reviewed the triage vital signs and the nursing notes.   HISTORY  Chief Complaint Weakness    HPI Dean Lynch is a 70 y.o. male with history of diabetes, hypertension, dementia who presents with complaints of weakness.  Patient reports his legs do not feel strong and he is having difficulty ambulating, this is atypical for him.  He denies fall or injury.  Denies cough or shortness of breath.  Does not think he has had any dysuria.  No abdominal pain or chest pain.  No new medications reported.  No fevers reported  Past Medical History:  Diagnosis Date  . BPH (benign prostatic hyperplasia)   . Diabetes mellitus without complication ([place])   . Gout   . Hypercholesteremia   . Hypertension   . Skin cancer 2017   melanoa- removed at [facility]  . Thrombocytopenia [facility])     Patient Active Problem List   Diagnosis Date Noted  . Dehydration   . Dementia without behavioral disturbance ([place])   . Type II diabetes mellitus with renal manifestations ([place]) 02/19/2020  . Acute metabolic encephalopathy 69/67/8938  . Nausea & vomiting 02/19/2020  . Acute renal failure superimposed on stage 3a chronic kidney disease ([place]) 02/19/2020  . Chronic diastolic CHF (congestive heart failure) ([place]) 02/19/2020  . Pressure injury of skin 02/19/2020  . Agitation 02/15/2020  . Bradycardia 12/23/2019  . Acute CHF (congestive heart failure) ([place]) 12/23/2019  . Thrombocytopenia ([place]) 07/18/2016  . Benign prostatic hyperplasia without lower urinary tract symptoms 07/16/2016  . History of melanoma 12/06/2015  . Diabetes mellitus type 2, uncomplicated ([place]) 10/23/5100  . 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 tophus 12/15/2013  . Essential hypertension 12/15/2013  . Hypercholesterolemia 12/15/2013    Past Surgical History:  Procedure Laterality Date  . CATARACT EXTRACTION W/PHACO Right  09/27/2015   Procedure: CATARACT EXTRACTION PHACO AND INTRAOCULAR LENS PLACEMENT (IOC);  Surgeon: Leandrew Koyanagi, MD;  Location: [place];  Service: Ophthalmology;  Laterality: Right;  DIABETIC  . KNEE ARTHROSCOPY    . PACEMAKER IMPLANT N/A 12/24/2019   Procedure: PACEMAKER IMPLANT;  Surgeon: Marzetta Board, MD;  Location: [place] CV LAB;  Service: Cardiovascular;  Laterality: N/A;    Prior to Admission medications   Medication Sig Start Date End Date Taking? Authorizing Provider  aspirin EC 81 MG tablet Take 81 mg by mouth daily.  02/25/18   [provider]  carvedilol (COREG) 6.25 MG tablet Take 1 tablet (6.25 mg total) by mouth 2 (two) times daily with a meal. 12/25/19 01/24/20  Wyvonnia Dusky, MD  Cholecalciferol (VITAMIN D3) 50 MCG (2000 UT) capsule Take 2,000 Units by mouth 2 (two) times daily.    [provider]  divalproex (DEPAKOTE) 125 MG DR tablet Take 125 mg by mouth at bedtime. 02/17/20   [provider]  divalproex (DEPAKOTE) 250 MG DR tablet Take 250 mg by mouth at bedtime. Patient not taking: Reported on 03/07/2020 02/18/20   [provider]  dutasteride (AVODART) 0.5 MG capsule Take 0.5 mg by mouth every morning.    [provider]  febuxostat (ULORIC) 40 MG tablet Take 40 mg by mouth daily.  02/25/18   [provider]  lisinopril-hydrochlorothiazide (PRINZIDE,ZESTORETIC) 20-12.5 MG tablet Take 1 tablet by mouth every morning.    [provider]  magnesium oxide (MAG-OX) 400 MG tablet Take 400 mg by mouth 2 (two) times daily.  [provider]  Omega-3 Fatty Acids (FISH OIL) 1000 MG CAPS Take 1,000 mg by mouth daily.    [provider]  QUEtiapine (SEROQUEL) 25 MG tablet Take 1 tablet (25 mg total) by mouth 2 (two) times daily. Take one at bedtime plus one daily as needed only for agitation Patient not taking: Reported on 03/07/2020 02/16/20   Clapacs, Madie Reno, MD  traZODone  (DESYREL) 50 MG tablet Take 50 mg by mouth at bedtime. 12/01/19   [provider]     Allergies Patient has no known allergies.  Family History  Problem Relation Age of Onset  . Coronary artery disease Mother   . Polycythemia Mother   . Macular degeneration Mother   . Dementia Mother   . Coronary artery disease Father     Social History Social History   Tobacco Use  . Smoking status: Never Smoker  . Smokeless tobacco: Never Used  Vaping Use  . Vaping Use: Never used  Substance Use Topics  . Alcohol use: No  . Drug use: No    Review of Systems  Constitutional: As above Eyes: No visual changes.  ENT: No sore throat. Cardiovascular: Denies chest pain. Respiratory: Denies shortness of breath. Gastrointestinal: No abdominal pain.  No nausea, no vomiting.   Genitourinary: Negative for dysuria. Musculoskeletal: As above, no back pain Skin: Negative for rash. Neurological: Negative for headaches or weakness   ____________________________________________   PHYSICAL EXAM:  VITAL SIGNS: ED Triage Vitals  Enc Vitals Group     BP 03/23/20 1858 (!) 133/53     Pulse Rate 03/23/20 1858 82     Resp 03/23/20 1858 13     Temp 03/23/20 1858 (!) 103 F (39.4 C)     Temp Source 03/23/20 1858 Oral     SpO2 03/23/20 1858 98 %     Weight 03/23/20 1902 81.6 kg (180 lb)     Height 03/23/20 1902 1.778 m (5\' 10" )     Head Circumference --      Peak Flow --      Pain Score 03/23/20 1902 0     Pain Loc --      Pain Edu? --      Excl. in [place]? --     Constitutional: Alert and oriented.   Nose: No congestion/rhinnorhea. Mouth/Throat: Mucous membranes are moist.   Neck:  Painless ROM Cardiovascular: Normal rate, regular rhythm. Grossly normal heart sounds.  Good peripheral circulation. Respiratory: Normal respiratory effort.  No retractions. Lungs CTAB. Gastrointestinal: Soft and nontender. No distention.  No CVA tenderness. Musculoskeletal:   Warm and well  perfused Neurologic:  Normal speech and language. No gross focal neurologic deficits are appreciated.  Skin:  Skin is warm, dry and intact. No rash noted. Psychiatric: Mood and affect are normal. Speech and behavior are normal.  ____________________________________________   LABS (all labs ordered are listed, but only abnormal results are displayed)  Labs Reviewed  COMPREHENSIVE METABOLIC PANEL - Abnormal; Notable for the following components:      Result Value   Sodium 133 (*)    Glucose, Bld 133 (*)    BUN 33 (*)    Creatinine, Ser 1.45 (*)    Calcium 8.8 (*)    Total Protein 5.8 (*)    Albumin 3.2 (*)    AST 43 (*)    Total Bilirubin 3.2 (*)    GFR, Estimated 52 (*)    All other components within normal limits  LACTIC ACID, PLASMA -  Abnormal; Notable for the following components:   Lactic Acid, Venous 3.2 (*)    All other components within normal limits  PROTIME-INR - Abnormal; Notable for the following components:   Prothrombin Time 17.4 (*)    INR 1.5 (*)    All other components within normal limits  URINALYSIS, COMPLETE (UACMP) WITH MICROSCOPIC - Abnormal; Notable for the following components:   Color, Urine AMBER (*)    APPearance CLOUDY (*)    Hgb urine dipstick LARGE (*)    Protein, ur 100 (*)    Leukocytes,Ua MODERATE (*)    WBC, UA >50 (*)    Bacteria, UA FEW (*)    All other components within normal limits  CBC WITH DIFFERENTIAL/PLATELET - Abnormal; Notable for the following components:   RBC 2.94 (*)    Hemoglobin 11.0 (*)    HCT 29.4 (*)    MCH 37.4 (*)    MCHC 37.4 (*)    Platelets 78 (*)    Lymphs Abs 0.5 (*)    All other components within normal limits  RESP PANEL BY RT-PCR (FLU A&B, COVID) ARPGX2  CULTURE, BLOOD (ROUTINE X 2)  CULTURE, BLOOD (ROUTINE X 2)  URINE CULTURE  APTT  LACTIC ACID, PLASMA  CK  MAGNESIUM  PHOSPHORUS  AMMONIA  TROPONIN I (HIGH SENSITIVITY)   ____________________________________________  EKG  ED ECG REPORT I,  Lavonia Drafts, the attending physician, personally viewed and interpreted this ECG.  Date: 03/23/2020  Rhythm: Sinus rhythm, paced QRS Axis: Atypical Intervals: IVCD ST/T Wave abnormalities: normal Narrative Interpretation: no evidence of acute ischemia  ____________________________________________  RADIOLOGY  Chest x-ray viewed by me, questionable left lower ____________________________________________   PROCEDURES  Procedure(s) performed: No  Procedures   Critical Care performed: yes  CRITICAL CARE Performed by: Lavonia Drafts   Total critical care time: 30 minutes  Critical care time was exclusive of separately billable procedures and treating other patients.  Critical care was necessary to treat or prevent imminent or life-threatening deterioration.  Critical care was time spent personally by me on the following activities: development of treatment plan with patient and/or surrogate as well as nursing, discussions with consultants, evaluation of patient's response to treatment, examination of patient, obtaining history from patient or surrogate, ordering and performing treatments and interventions, ordering and review of laboratory studies, ordering and review of radiographic studies, pulse oximetry and re-evaluation of patient's condition.  ____________________________________________   INITIAL IMPRESSION / ASSESSMENT AND PLAN / ED COURSE  Pertinent labs & imaging results that were available during my care of the patient were reviewed by me and considered in my medical decision making (see chart for details).  Patient presents with generalized weakness as described above, found to be febrile in the emergency department, raising suspicion for sepsis/infection as the cause.  Code sepsis activated.  Broad-spectrum antibiotics ordered, IV fluid bolus ordered.  Lactic acid is elevated at 3.2.  Patient's blood pressure is reassuring, heart rate is reassuring.  Chest  x-ray with questionable infiltrate, urinalysis consistent with urinary tract infection as well.  No flank pain, no abdominal pain but significant hematuria noted, will send for CT renal stone study to rule out infected kidney stone   CT reassuring, cirrrhosis noted, will admit for further tx     ____________________________________________   FINAL CLINICAL IMPRESSION(S) / ED DIAGNOSES  Final diagnoses:  Sepsis, due to unspecified organism, unspecified whether acute organ dysfunction present [facility])        Note:  This document was prepared using  [employer]agon Armed forces training and education officer and may include unintentional dictation errors.   Lavonia Drafts, MD 03/23/20 2107

## 2020-03-23 NOTE — Consult Note (Signed)
Pharmacy Antibiotic Note  Dean Lynch is a 70 y.o. male with medical history including diabetes, HTN, dementia, BPH admitted on 03/23/2020 with sepsis from unknown etiology. Vitals notable for fever. No leukocytosis or left shift on differential. UA with pyuria, few bacteria. CXR with mild left basilar atelectasis or infiltrate with small left pleural effusion. Pharmacy has been consulted for vancomycin and cefepime dosing. Patient is also ordered metronidazole.  Plan: Cefepime 2 g IV q12h  Vancomycin 2 g IV LD (24.5 mg/kg) followed by maintenance dose of vancomycin 1250 mg IV q24h --Calculated AUC: 466, Cmin 11.5 --Daily Scr per protocol --Levels at steady state as indicated  Continue to follow renal function and adjust antibiotics as indicated. Monitor culture data and ability to narrow antibiotics.  Height: 5\' 10"  (177.8 cm) Weight: 81.6 kg (180 lb) IBW/kg (Calculated) : 73  Temp (24hrs), Avg:100.7 F (38.2 C), Min:98.3 F (36.8 C), Max:103 F (39.4 C)  Recent Labs  Lab 03/23/20 1916 03/23/20 1919 03/23/20 2117  WBC  --  7.9  --   CREATININE  --  1.45*  --   LATICACIDVEN 3.2*  --  2.4*    Estimated Creatinine Clearance: 49.6 mL/min (A) (by C-G formula based on SCr of 1.45 mg/dL (H)).    No Known Allergies  Antimicrobials this admission: Vancomycin 3/17 >>  Cefepime 3/17 >>  Metronidazole 3/17 >>   Dose adjustments this admission: N/A  Microbiology results: 3/17 BCx: pending 3/17 UCx: pending  3/17 MRSA PCR: pending  Thank you for allowing pharmacy to be a part of this patient's care.  Benita Gutter 03/23/2020 10:19 PM

## 2020-03-24 DIAGNOSIS — L8991 Pressure ulcer of unspecified site, stage 1: Secondary | ICD-10-CM | POA: Diagnosis present

## 2020-03-24 DIAGNOSIS — N179 Acute kidney failure, unspecified: Secondary | ICD-10-CM | POA: Diagnosis present

## 2020-03-24 DIAGNOSIS — N3000 Acute cystitis without hematuria: Secondary | ICD-10-CM | POA: Diagnosis present

## 2020-03-24 DIAGNOSIS — F0390 Unspecified dementia without behavioral disturbance: Secondary | ICD-10-CM | POA: Diagnosis present

## 2020-03-24 DIAGNOSIS — K7469 Other cirrhosis of liver: Secondary | ICD-10-CM | POA: Diagnosis not present

## 2020-03-24 DIAGNOSIS — R531 Weakness: Secondary | ICD-10-CM

## 2020-03-24 DIAGNOSIS — E1122 Type 2 diabetes mellitus with diabetic chronic kidney disease: Secondary | ICD-10-CM | POA: Diagnosis present

## 2020-03-24 DIAGNOSIS — R509 Fever, unspecified: Secondary | ICD-10-CM | POA: Diagnosis not present

## 2020-03-24 DIAGNOSIS — M11262 Other chondrocalcinosis, left knee: Secondary | ICD-10-CM | POA: Diagnosis not present

## 2020-03-24 DIAGNOSIS — N3001 Acute cystitis with hematuria: Principal | ICD-10-CM

## 2020-03-24 DIAGNOSIS — N1832 Chronic kidney disease, stage 3b: Secondary | ICD-10-CM | POA: Diagnosis present

## 2020-03-24 DIAGNOSIS — M11261 Other chondrocalcinosis, right knee: Secondary | ICD-10-CM | POA: Diagnosis present

## 2020-03-24 DIAGNOSIS — K7460 Unspecified cirrhosis of liver: Secondary | ICD-10-CM | POA: Insufficient documentation

## 2020-03-24 DIAGNOSIS — E7800 Pure hypercholesterolemia, unspecified: Secondary | ICD-10-CM | POA: Diagnosis present

## 2020-03-24 DIAGNOSIS — R791 Abnormal coagulation profile: Secondary | ICD-10-CM | POA: Diagnosis not present

## 2020-03-24 DIAGNOSIS — E860 Dehydration: Secondary | ICD-10-CM | POA: Diagnosis not present

## 2020-03-24 DIAGNOSIS — L89301 Pressure ulcer of unspecified buttock, stage 1: Secondary | ICD-10-CM | POA: Diagnosis not present

## 2020-03-24 DIAGNOSIS — D61818 Other pancytopenia: Secondary | ICD-10-CM | POA: Diagnosis present

## 2020-03-24 DIAGNOSIS — Z950 Presence of cardiac pacemaker: Secondary | ICD-10-CM | POA: Diagnosis not present

## 2020-03-24 DIAGNOSIS — D696 Thrombocytopenia, unspecified: Secondary | ICD-10-CM | POA: Diagnosis not present

## 2020-03-24 DIAGNOSIS — M1712 Unilateral primary osteoarthritis, left knee: Secondary | ICD-10-CM | POA: Diagnosis present

## 2020-03-24 DIAGNOSIS — E785 Hyperlipidemia, unspecified: Secondary | ICD-10-CM | POA: Diagnosis present

## 2020-03-24 DIAGNOSIS — I129 Hypertensive chronic kidney disease with stage 1 through stage 4 chronic kidney disease, or unspecified chronic kidney disease: Secondary | ICD-10-CM | POA: Diagnosis present

## 2020-03-24 DIAGNOSIS — Z85828 Personal history of other malignant neoplasm of skin: Secondary | ICD-10-CM | POA: Diagnosis not present

## 2020-03-24 DIAGNOSIS — R102 Pelvic and perineal pain: Secondary | ICD-10-CM | POA: Diagnosis not present

## 2020-03-24 DIAGNOSIS — Z8619 Personal history of other infectious and parasitic diseases: Secondary | ICD-10-CM | POA: Diagnosis not present

## 2020-03-24 DIAGNOSIS — N451 Epididymitis: Secondary | ICD-10-CM | POA: Diagnosis not present

## 2020-03-24 DIAGNOSIS — D649 Anemia, unspecified: Secondary | ICD-10-CM | POA: Diagnosis present

## 2020-03-24 DIAGNOSIS — Z20822 Contact with and (suspected) exposure to covid-19: Secondary | ICD-10-CM | POA: Diagnosis present

## 2020-03-24 DIAGNOSIS — Z66 Do not resuscitate: Secondary | ICD-10-CM | POA: Diagnosis present

## 2020-03-24 DIAGNOSIS — N400 Enlarged prostate without lower urinary tract symptoms: Secondary | ICD-10-CM | POA: Diagnosis present

## 2020-03-24 DIAGNOSIS — Z8249 Family history of ischemic heart disease and other diseases of the circulatory system: Secondary | ICD-10-CM | POA: Diagnosis not present

## 2020-03-24 DIAGNOSIS — M25562 Pain in left knee: Secondary | ICD-10-CM | POA: Diagnosis not present

## 2020-03-24 DIAGNOSIS — M25462 Effusion, left knee: Secondary | ICD-10-CM | POA: Diagnosis not present

## 2020-03-24 DIAGNOSIS — R7301 Impaired fasting glucose: Secondary | ICD-10-CM | POA: Diagnosis not present

## 2020-03-24 LAB — COMPREHENSIVE METABOLIC PANEL
ALT: 16 U/L (ref 0–44)
AST: 40 U/L (ref 15–41)
Albumin: 2.5 g/dL — ABNORMAL LOW (ref 3.5–5.0)
Alkaline Phosphatase: 41 U/L (ref 38–126)
Anion gap: 7 (ref 5–15)
BUN: 33 mg/dL — ABNORMAL HIGH (ref 8–23)
CO2: 21 mmol/L — ABNORMAL LOW (ref 22–32)
Calcium: 8 mg/dL — ABNORMAL LOW (ref 8.9–10.3)
Chloride: 105 mmol/L (ref 98–111)
Creatinine, Ser: 1.26 mg/dL — ABNORMAL HIGH (ref 0.61–1.24)
GFR, Estimated: >60 mL/min (ref >60)
Glucose, Bld: 105 mg/dL — ABNORMAL HIGH (ref 70–99)
Potassium: 4.6 mmol/L (ref 3.5–5.1)
Sodium: 133 mmol/L — ABNORMAL LOW (ref 135–145)
Total Bilirubin: 2.8 mg/dL — ABNORMAL HIGH (ref 0.3–1.2)
Total Protein: 4.5 g/dL — ABNORMAL LOW (ref 6.5–8.1)

## 2020-03-24 LAB — CBC WITH DIFFERENTIAL/PLATELET
Abs Immature Granulocytes: 0.03 10*3/uL (ref 0.00–0.07)
Basophils Absolute: 0 10*3/uL (ref 0.0–0.1)
Basophils Relative: 0 %
Eosinophils Absolute: 0 10*3/uL (ref 0.0–0.5)
Eosinophils Relative: 0 %
HCT: 23.9 % — ABNORMAL LOW (ref 39.0–52.0)
Hemoglobin: 8.7 g/dL — ABNORMAL LOW (ref 13.0–17.0)
Immature Granulocytes: 0 %
Lymphocytes Relative: 14 %
Lymphs Abs: 1 10*3/uL (ref 0.7–4.0)
MCH: 37 pg — ABNORMAL HIGH (ref 26.0–34.0)
MCHC: 36.4 g/dL — ABNORMAL HIGH (ref 30.0–36.0)
MCV: 101.7 fL — ABNORMAL HIGH (ref 80.0–100.0)
Monocytes Absolute: 0.9 10*3/uL (ref 0.1–1.0)
Monocytes Relative: 13 %
Neutro Abs: 5.1 10*3/uL (ref 1.7–7.7)
Neutrophils Relative %: 73 %
Platelets: 87 10*3/uL — ABNORMAL LOW (ref 150–400)
RBC: 2.35 MIL/uL — ABNORMAL LOW (ref 4.22–5.81)
RDW: 13.7 % (ref 11.5–15.5)
WBC: 7.1 10*3/uL (ref 4.0–10.5)
nRBC: 0 % (ref 0.0–0.2)

## 2020-03-24 LAB — PHOSPHORUS: Phosphorus: 3.7 mg/dL (ref 2.5–4.6)

## 2020-03-24 LAB — HEMOGLOBIN A1C
Hgb A1c MFr Bld: 5 % (ref 4.8–5.6)
Mean Plasma Glucose: 96.8 mg/dL

## 2020-03-24 LAB — HEPATITIS PANEL, ACUTE
HCV Ab: NONREACTIVE
Hep A IgM: NONREACTIVE
Hep B C IgM: NONREACTIVE
Hepatitis B Surface Ag: NONREACTIVE

## 2020-03-24 LAB — PROTIME-INR
INR: 1.7 — ABNORMAL HIGH (ref 0.8–1.2)
Prothrombin Time: 19.1 seconds — ABNORMAL HIGH (ref 11.4–15.2)

## 2020-03-24 LAB — LACTIC ACID, PLASMA
Lactic Acid, Venous: 2 mmol/L (ref 0.5–1.9)
Lactic Acid, Venous: 1.8 mmol/L (ref 0.5–1.9)
Lactic Acid, Venous: 1.6 mmol/L (ref 0.5–1.9)

## 2020-03-24 LAB — SEDIMENTATION RATE: Sed Rate: 35 mm/hr — ABNORMAL HIGH (ref 0–20)

## 2020-03-24 LAB — CK: Total CK: 183 U/L (ref 49–397)

## 2020-03-24 LAB — HEPATITIS B CORE ANTIBODY, TOTAL: Hep B Core Total Ab: NONREACTIVE

## 2020-03-24 LAB — MRSA PCR SCREENING: MRSA by PCR: NEGATIVE

## 2020-03-24 LAB — CBG MONITORING, ED
Glucose-Capillary: 99 mg/dL (ref 70–99)
Glucose-Capillary: 77 mg/dL (ref 70–99)
Glucose-Capillary: 100 mg/dL — ABNORMAL HIGH (ref 70–99)
Glucose-Capillary: 150 mg/dL — ABNORMAL HIGH (ref 70–99)

## 2020-03-24 LAB — GLUCOSE, CAPILLARY
Glucose-Capillary: 144 mg/dL — ABNORMAL HIGH (ref 70–99)
Glucose-Capillary: 189 mg/dL — ABNORMAL HIGH (ref 70–99)

## 2020-03-24 LAB — VALPROIC ACID LEVEL: Valproic Acid Lvl: 11 ug/mL — ABNORMAL LOW (ref 50.0–100.0)

## 2020-03-24 LAB — HEPATITIS C ANTIBODY: HCV Ab: NONREACTIVE

## 2020-03-24 LAB — MAGNESIUM: Magnesium: 1.9 mg/dL (ref 1.7–2.4)

## 2020-03-24 LAB — TSH: TSH: 1.153 u[IU]/mL (ref 0.350–4.500)

## 2020-03-24 LAB — PROCALCITONIN: Procalcitonin: 0.27 ng/mL

## 2020-03-24 LAB — PREALBUMIN: Prealbumin: 6.7 mg/dL — ABNORMAL LOW (ref 18–38)

## 2020-03-24 LAB — HEPATITIS B SURFACE ANTIGEN: Hepatitis B Surface Ag: NONREACTIVE

## 2020-03-24 LAB — APTT: aPTT: 38 seconds — ABNORMAL HIGH (ref 24–36)

## 2020-03-24 MED ORDER — LACTATED RINGERS IV SOLN: INTRAVENOUS | Status: DC

## 2020-03-24 MED ORDER — DUTASTERIDE 0.5 MG PO CAPS
0.5000 mg | ORAL_CAPSULE | Freq: Every morning | ORAL | Status: DC
  Administered 2020-03-24 – 2020-03-27 (×4): 0.5 mg via ORAL
  Filled 2020-03-24 (×5): qty 1

## 2020-03-24 MED ORDER — ENSURE ENLIVE PO LIQD
237.0000 mL | Freq: Three times a day (TID) | ORAL | Status: DC
  Administered 2020-03-24 – 2020-03-27 (×8): 237 mL via ORAL

## 2020-03-24 MED ORDER — TRAZODONE HCL 50 MG PO TABS
50.0000 mg | ORAL_TABLET | Freq: Every day | ORAL | Status: DC
  Administered 2020-03-24 – 2020-03-26 (×3): 50 mg via ORAL
  Filled 2020-03-24 (×3): qty 1

## 2020-03-24 MED ORDER — PHYTONADIONE 5 MG PO TABS
10.0000 mg | ORAL_TABLET | Freq: Once | ORAL | Status: AC
  Administered 2020-03-24: 10 mg via ORAL
  Filled 2020-03-24 (×2): qty 2

## 2020-03-24 MED ORDER — ONDANSETRON HCL 4 MG/2ML IJ SOLN
4.0000 mg | Freq: Four times a day (QID) | INTRAMUSCULAR | Status: DC | PRN
  Administered 2020-03-24: 23:00:00 4 mg via INTRAVENOUS
  Filled 2020-03-24: qty 2

## 2020-03-24 MED ORDER — ADULT MULTIVITAMIN W/MINERALS CH
1.0000 | ORAL_TABLET | Freq: Every day | ORAL | Status: DC
  Administered 2020-03-25 – 2020-03-27 (×3): 1 via ORAL
  Filled 2020-03-24 (×3): qty 1

## 2020-03-24 MED ORDER — ASPIRIN EC 81 MG PO TBEC
81.0000 mg | DELAYED_RELEASE_TABLET | Freq: Every day | ORAL | Status: DC
Start: 2020-03-24 — End: 2020-03-27
  Administered 2020-03-24 – 2020-03-27 (×4): 81 mg via ORAL
  Filled 2020-03-24 (×4): qty 1

## 2020-03-24 MED ORDER — BISMUTH SUBSALICYLATE 262 MG/15ML PO SUSP
30.0000 mL | ORAL | Status: DC | PRN
  Administered 2020-03-24: 30 mL via ORAL
  Filled 2020-03-24 (×2): qty 118

## 2020-03-24 MED ORDER — SODIUM CHLORIDE 0.9 % IV SOLN: INTRAVENOUS | Status: DC

## 2020-03-24 NOTE — Progress Notes (Addendum)
OT Cancellation Note  Patient Details Name: Dean Lynch MRN: [id number] DOB: 30-May-1950   Cancelled Treatment:    Reason Eval/Treat Not Completed: Patient at procedure or test/ unavailable. Consult received, chart reviewed. Pt noted to be off the floor. Will re-attempt OT eval at later date/time as available and appropriate.   2nd attempt in pm: RN with patient. Pt unavailable. Will re-attempt next date.  Hanley Hays, MPH, MS, OTR/L ascom [phone] 03/24/20, 2:16 PM

## 2020-03-24 NOTE — Progress Notes (Signed)
   03/24/20 1102  Clinical Encounter Type  Visited With Patient and family together  Visit Type Initial  Referral From Nurse  Consult/Referral To Chaplain  Spiritual Encounters  Spiritual Needs Prayer;Emotional  Chaplain Ormond responded to a page for room 31 in ED. Pt, Dean Lynch and his wife Mrs. Adonis Huguenin who was having a hard time dealing with the prognosis of her husband. I provided a ministry of presence, emotional support, reflective listening and prayer. I advised Mrs. Adonis Huguenin our spiritual care department is always available if she would like to talk with someone again.

## 2020-03-24 NOTE — Progress Notes (Signed)
Initial Nutrition Assessment  DOCUMENTATION CODES:   Not applicable  INTERVENTION:   Ensure Enlive po TID, each supplement provides 350 kcal and 20 grams of protein  MVI po daily   Pt at high refeed risk; recommend monitor potassium, magnesium and phosphorus labs daily until stable  NUTRITION DIAGNOSIS:   Increased nutrient needs related to chronic illness (cirrhosis, CHF) as evidenced by estimated needs.  GOAL:   Patient will meet greater than or equal to 90% of their needs  MONITOR:   PO intake,Supplement acceptance,Labs,Weight trends,Skin,I & O's  REASON FOR ASSESSMENT:   Consult Assessment of nutrition requirement/status  ASSESSMENT:   70 y.o. male with medical history significant of pancytopenia, hypertension, hyperlipidemia, type 2 diabetes, BPH, second-degree AV block, CKD stage IIIb, dCHF, s/p pacemaker placement and dementia who is admitted with new cirrhosis and UTI   Unable to see pt today x 3 attempts. Per chart, pt is down 44lbs(20%) over the past year; this is significant weight loss. RD suspects pt with decreased appetite and oral intake at baseline. RD will add supplements and MVI to help pt meet his estimated needs. RD will obtain nutrition related history and exam at follow-up.   Medications reviewed and include: aspirin, colace, senokot, NaCl @50ml /hr, cefepime   Labs reviewed: Na 133(L), BUN 33(H), creat 1.26(H), P 3.7 wnl, Mg 1.9 wnl, tbili 2.8(H) Hgb 8.7(L), Hct 23.9(L), MCV 101.7(H), MCH 37.0(H), MCHC 36.4(H)  NUTRITION - FOCUSED PHYSICAL EXAM: Unable to perform at this time   Diet Order:   Diet Order            Diet regular Room service appropriate? Yes; Fluid consistency: Thin  Diet effective now                EDUCATION NEEDS:   Not appropriate for education at this time  Skin:  Skin Assessment: Reviewed RN Assessment (ecchymosis) stage I sacrum   Last BM:  3/17  Height:   Ht Readings from Last 1 Encounters:  03/23/20 5\' 10"   (1.778 m)    Weight:   Wt Readings from Last 1 Encounters:  03/23/20 81.6 kg    Ideal Body Weight:  75.45 kg  BMI:  Body mass index is 25.83 kg/m.  Estimated Nutritional Needs:   Kcal:  2100-2400kcal/day  Protein:  105-120g/day  Fluid:  1.9-2.2L/day  Koleen Distance MS, RD, LDN Please refer to [facility] for RD and/or RD on-call/weekend/after hours pager

## 2020-03-24 NOTE — Evaluation (Signed)
Physical Therapy Evaluation Patient Details Name: Dean Lynch MRN: [id number] DOB: 25-Sep-1950 Today's Date: 03/24/2020   History of Present Illness  Pt is a 70 y/o M admitted on 03/23/20 with c/c of increasing weakness x 3-4 days. Pt with new diagnosis of cirrhosis. PMH: pancytopenia, HTN, HLD, DM2, BPH 2nd degree AV block, CKD stage IIIb, dCHF, pacemaker placement, dementia, gout, skin CA  Clinical Impression  Pt seen for PT evaluation with pt able to ambulate short distances without AD but requiring min assist, when provided with RW pt is able to ambulate increased distances with supervision. Pt performs BLE strengthening exercises as noted below, with instructional cuing from PT. PT educates pt & wife on need for caregiver assistance for stair negotiation & to hold to "side rail" as able. Pt would benefit from ongoing acute PT services to progress balance, endurance, and gait with LRAD.    Follow Up Recommendations Home health PT    Equipment Recommendations  None recommended by PT (pt already has RW, rollator, SPC)    Recommendations for Other Services       Precautions / Restrictions Precautions Precautions: Fall Restrictions Weight Bearing Restrictions: No      Mobility  Bed Mobility Overal bed mobility: Modified Independent                  Transfers Overall transfer level: Needs assistance   Transfers: Sit to/from Stand Sit to Stand: Supervision         General transfer comment: instructional cuing for safe hand placement  Ambulation/Gait Ambulation/Gait assistance: Min assist;Supervision Gait Distance (Feet): 20 Feet (+ 100 ft + 100 ft) Assistive device: None;Rolling walker (2 wheeled) Gait Pattern/deviations: Decreased step length - right;Decreased step length - left;Decreased stride length     General Gait Details: 20 ft without AD & min assist, 100 ft + 100 ft with RW & supervision  Stairs Stairs:  (educated pt & wife on need for wife to  assist him with stair negotiation & to hold to doorframe as pt reports he can; encouraged them to look into rail installation for future use)          Wheelchair Mobility    Modified Rankin (Stroke Patients Only)       Balance Overall balance assessment: Needs assistance Sitting-balance support: No upper extremity supported;Feet supported Sitting balance-Leahy Scale: Good     Standing balance support: No upper extremity supported Standing balance-Leahy Scale: Good                               Pertinent Vitals/Pain Pain Assessment: 0-10 Pain Score: 6  Pain Location: B thighs, hamstrings Pain Descriptors / Indicators: Sore Pain Intervention(s): Limited activity within patient's tolerance;Monitored during session  Pt also c/o unrated abdominal discomfort "upset stomach" - nurse made aware.     Home Living Family/patient expects to be discharged to:: Private residence Living Arrangements: Spouse/significant other Available Help at Discharge: Family;Available PRN/intermittently Type of Home: House Home Access: Stairs to enter Entrance Stairs-Rails:  (can hold to doorframe on R) Entrance Stairs-Number of Steps: 2 Home Layout: One level Home Equipment: Walker - 2 wheels;Cane - single point Agricultural consultant)      Prior Function Level of Independence: Independent with assistive device(s)         Comments: mod I with SPC     Hand Dominance        Extremity/Trunk Assessment   Upper Extremity Assessment Upper Extremity  Assessment: Overall WFL for tasks assessed    Lower Extremity Assessment Lower Extremity Assessment: Generalized weakness    Cervical / Trunk Assessment Cervical / Trunk Assessment:  (forward head)  Communication   Communication: No difficulties  Cognition Arousal/Alertness: Awake/alert Behavior During Therapy: WFL for tasks assessed/performed Overall Cognitive Status: Within Functional Limits for tasks assessed                                         General Comments General comments (skin integrity, edema, etc.): SpO2 >90%    Exercises General Exercises - Lower Extremity Long Arc Quad: AROM;Strengthening;Right;Left;10 reps;Seated Hip Flexion/Marching: AROM;Strengthening;Right;Left;10 reps;Seated Other Exercises Other Exercises: 5x sit<>stand without BUE support with CGA x 2 sets   Assessment/Plan    PT Assessment Patient needs continued PT services  PT Problem List Decreased strength;Decreased mobility;Decreased balance;Decreased activity tolerance;Decreased knowledge of use of DME;Pain       PT Treatment Interventions DME instruction;Therapeutic activities;Gait training;Therapeutic exercise;Patient/family education;Stair training;Balance training;Functional mobility training;Neuromuscular re-education;Manual techniques;Modalities    PT Goals (Current goals can be found in the Care Plan section)  Acute Rehab PT Goals Patient Stated Goal: get better, go home PT Goal Formulation: With patient/family Time For Goal Achievement: 04/07/20 Potential to Achieve Goals: Good    Frequency Min 2X/week   Barriers to discharge        Co-evaluation               AM-PAC PT "6 Clicks" Mobility  Outcome Measure Help needed turning from your back to your side while in a flat bed without using bedrails?: None Help needed moving from lying on your back to sitting on the side of a flat bed without using bedrails?: None Help needed moving to and from a bed to a chair (including a wheelchair)?: A Little Help needed standing up from a chair using your arms (e.g., wheelchair or bedside chair)?: A Little Help needed to walk in hospital room?: A Little Help needed climbing 3-5 steps with a railing? : A Little 6 Click Score: 20    End of Session Equipment Utilized During Treatment: Gait belt Activity Tolerance: Patient tolerated treatment well;Patient limited by fatigue Patient left: in bed;with call  bell/phone within reach;with bed alarm set;with family/visitor present Nurse Communication: Mobility status PT Visit Diagnosis: Difficulty in walking, not elsewhere classified (R26.2);Muscle weakness (generalized) (M62.81);Unsteadiness on feet (R26.81);Pain Pain - Right/Left:  (BLE thigh/hamstring)    Time: 5003-7048 PT Time Calculation (min) (ACUTE ONLY): 20 min   Charges:   PT Evaluation $PT Eval Low Complexity: 1 Low PT Treatments $Therapeutic Exercise: 8-22 mins        Lavone Nian, PT, DPT 03/24/20, 3:46 PM   Waunita Schooner 03/24/2020, 3:44 PM

## 2020-03-24 NOTE — Progress Notes (Signed)
Patient ID: Dean Lynch, male   DOB: 1950-07-07, 70 y.o.   MRN: [id number] [facility] PROGRESS NOTE  Dean Lynch SJG:[id number] DOB: [birth year] DOA: 03/23/2020 PCP: Toni Arthurs, NP  HPI/Subjective: Patient coming in with weakness.  He had muscle pains in his upper arms and upper legs.  Had a fever of 103.  The diagnosis of cirrhosis is new to him.  No history of alcohol.  Complains of lower abdominal pain.  Objective: Vitals:   03/24/20 1100 03/24/20 1200  BP: (!) 119/59 125/62  Pulse: 70 70  Resp: (!) 22 19  Temp:    SpO2: 97% 98%    Intake/Output Summary (Last 24 hours) at 03/24/2020 1309 Last data filed at 03/24/2020 6546 Gross per 24 hour  Intake 355.55 ml  Output --  Net 355.55 ml   Filed Weights   03/23/20 1902  Weight: 81.6 kg    ROS: Review of Systems  Respiratory: Negative for shortness of breath.   Cardiovascular: Negative for chest pain.  Gastrointestinal: Positive for abdominal pain. Negative for nausea and vomiting.   Exam: Physical Exam HENT:     Head: Normocephalic.     Mouth/Throat:     Pharynx: No oropharyngeal exudate.  Eyes:     General: Lids are normal.     Conjunctiva/sclera: Conjunctivae normal.  Cardiovascular:     Rate and Rhythm: Normal rate and regular rhythm.     Heart sounds: Normal heart sounds, S1 normal and S2 normal.  Pulmonary:     Breath sounds: No decreased breath sounds, wheezing, rhonchi or rales.  Abdominal:     Palpations: Abdomen is soft.     Tenderness: There is abdominal tenderness in the suprapubic area.  Musculoskeletal:     Right lower leg: No swelling.     Left lower leg: No swelling.  Skin:    General: Skin is warm.     Findings: No rash.  Neurological:     Mental Status: He is alert and oriented to person, place, and time.       Data Reviewed: Basic Metabolic Panel: Recent Labs  Lab 03/23/20 1916 03/23/20 1919 03/24/20 0505  NA  --  133* 133*  K  --  4.8 4.6  CL  --  102 105   CO2  --  24 21*  GLUCOSE  --  133* 105*  BUN  --  33* 33*  CREATININE  --  1.45* 1.26*  CALCIUM  --  8.8* 8.0*  MG 1.7  --  1.9  PHOS 2.8  --  3.7   Liver Function Tests: Recent Labs  Lab 03/23/20 1919 03/24/20 0505  AST 43* 40  ALT 19 16  ALKPHOS 55 41  BILITOT 3.2* 2.8*  PROT 5.8* 4.5*  ALBUMIN 3.2* 2.5*    Recent Labs  Lab 03/23/20 2117  AMMONIA 27   CBC: Recent Labs  Lab 03/23/20 1919 03/24/20 0434  WBC 7.9 7.1  NEUTROABS 6.6 5.1  HGB 11.0* 8.7*  HCT 29.4* 23.9*  MCV 100.0 101.7*  PLT 78* 87*   Cardiac Enzymes: Recent Labs  Lab 03/23/20 1916  CKTOTAL 295   BNP (last 3 results) Recent Labs    12/23/19 1851 02/19/20 0817  BNP 220.7* 23.3     CBG: Recent Labs  Lab 03/23/20 2224 03/24/20 0130 03/24/20 0435 03/24/20 0838 03/24/20 1134  GLUCAP 108* 99 77 100* 150*    Recent Results (from the past 240 hour(s))  Resp Panel by RT-PCR (Flu A&B, Covid)  Nasopharyngeal Swab     Status: None   Collection Time: 03/23/20  7:16 PM   Specimen: Nasopharyngeal Swab; Nasopharyngeal(NP) swabs in vial transport medium  Result Value Ref Range Status   SARS Coronavirus 2 by RT PCR NEGATIVE NEGATIVE Final    Comment: (NOTE) SARS-CoV-2 target nucleic acids are NOT DETECTED.  The SARS-CoV-2 RNA is generally detectable in upper respiratory specimens during the acute phase of infection. The lowest concentration of SARS-CoV-2 viral copies this assay can detect is 138 copies/mL. A negative result does not preclude SARS-Cov-2 infection and should not be used as the sole basis for treatment or other patient management decisions. A negative result may occur with  improper specimen collection/handling, submission of specimen other than nasopharyngeal swab, presence of viral mutation(s) within the areas targeted by this assay, and inadequate number of viral copies(<138 copies/mL). A negative result must be combined with clinical observations, patient history, and  epidemiological information. The expected result is Negative.  Fact Sheet for Patients:  EntrepreneurPulse.com.au  Fact Sheet for Healthcare Providers:  IncredibleEmployment.be  This test is no t yet approved or cleared by the [place] FDA and  has been authorized for detection and/or diagnosis of SARS-CoV-2 by FDA under an Emergency Use Authorization (EUA). This EUA will remain  in effect (meaning this test can be used) for the duration of the COVID-19 declaration under Section 564(b)(1) of the Act, 21 U.S.C.section 360bbb-3(b)(1), unless the authorization is terminated  or revoked sooner.       Influenza A by PCR NEGATIVE NEGATIVE Final   Influenza B by PCR NEGATIVE NEGATIVE Final    Comment: (NOTE) The Xpert Xpress SARS-CoV-2/FLU/RSV plus assay is intended as an aid in the diagnosis of influenza from Nasopharyngeal swab specimens and should not be used as a sole basis for treatment. Nasal washings and aspirates are unacceptable for Xpert Xpress SARS-CoV-2/FLU/RSV testing.  Fact Sheet for Patients: EntrepreneurPulse.com.au  Fact Sheet for Healthcare Providers: IncredibleEmployment.be  This test is not yet approved or cleared by the [place] FDA and has been authorized for detection and/or diagnosis of SARS-CoV-2 by FDA under an Emergency Use Authorization (EUA). This EUA will remain in effect (meaning this test can be used) for the duration of the COVID-19 declaration under Section 564(b)(1) of the Act, 21 U.S.C. section 360bbb-3(b)(1), unless the authorization is terminated or revoked.  Performed at [facility], [address] [place], [place] [address]   Blood Culture (routine x 2)     Status: None (Preliminary result)   Collection Time: 03/23/20  7:16 PM   Specimen: BLOOD  Result Value Ref Range Status   Specimen Description BLOOD LEFT ANTECUBITAL  Final   Special  Requests   Final    BOTTLES DRAWN AEROBIC AND ANAEROBIC Blood Culture adequate volume   Culture   Final    NO GROWTH < 12 HOURS Performed at [facility], [address] [place],  [address]    Report Status PENDING  Incomplete  Blood Culture (routine x 2)     Status: None (Preliminary result)   Collection Time: 03/23/20  7:18 PM   Specimen: BLOOD  Result Value Ref Range Status   Specimen Description BLOOD RIGHT ANTECUBITAL  Final   Special Requests   Final    BOTTLES DRAWN AEROBIC AND ANAEROBIC Blood Culture results may not be optimal due to an excessive volume of blood received in culture bottles   Culture   Final    NO GROWTH < 12 HOURS  Performed at [facility], [address] [place], [place] [address]    Report Status PENDING  Incomplete  MRSA PCR Screening     Status: None   Collection Time: 03/24/20  2:32 AM   Specimen: Nasopharyngeal  Result Value Ref Range Status   MRSA by PCR NEGATIVE NEGATIVE Final    Comment:        The GeneXpert MRSA Assay (FDA approved for NASAL specimens only), is one component of a comprehensive MRSA colonization surveillance program. It is not intended to diagnose MRSA infection nor to guide or monitor treatment for MRSA infections. Performed at [facility], [address] [place], [place] [address]      Studies: [facility] Chest [place] 1 View  Result Date: 03/23/2020 CLINICAL DATA:  Weakness. EXAM: PORTABLE CHEST 1 VIEW COMPARISON:  December 23, 2019. FINDINGS: The heart size and mediastinal contours are within normal limits. No pneumothorax is noted. Right lung is clear. Left-sided pacemaker is noted with leads in grossly good position. Mild left basilar atelectasis or infiltrate is noted with small left pleural effusion. The visualized skeletal structures are unremarkable. IMPRESSION: Mild left basilar atelectasis or infiltrate is noted with small left pleural effusion. Electronically Signed   By: Marijo Conception M.D.   On: 03/23/2020 19:44   CT Renal Stone Study  Result Date: 03/23/2020 CLINICAL DATA:  Dark urine, hematuria EXAM: CT ABDOMEN AND PELVIS WITHOUT CONTRAST TECHNIQUE: Multidetector CT imaging of the abdomen and pelvis was performed following the standard protocol without IV contrast. COMPARISON:  02/19/2020 FINDINGS: Lower chest: Small left pleural effusion with minimal left lower lobe atelectasis. No acute airspace disease. Cardiac pacer identified. Hepatobiliary: Calcified gallstones are identified without cholecystitis. Slight nodularity of the liver capsule unchanged, which may reflect cirrhosis. No focal liver abnormalities on this unenhanced exam. No biliary dilation. Pancreas: Unremarkable. No pancreatic ductal dilatation or surrounding inflammatory changes. Spleen: Stable splenomegaly.  No focal abnormality. Adrenals/Urinary Tract: Stable nonobstructing 3 mm right renal calculus. Left kidney is unremarkable. The adrenals and bladder are normal. Stomach/Bowel: No bowel obstruction or ileus. Normal appendix right lower quadrant. No bowel wall thickening or inflammatory change. Vascular/Lymphatic: Aortic atherosclerosis. No enlarged abdominal or pelvic lymph nodes. Reproductive: Prostate is enlarged but stable. Central prostate calcifications again noted. Other: No free fluid or free gas.  No abdominal wall hernia. Musculoskeletal: No acute or destructive bony lesions. Reconstructed images demonstrate no additional findings. IMPRESSION: 1. Nonobstructing 3 mm right renal calculus. 2. Stable hepatic capsular nodularity consistent with cirrhosis. 3. Stable splenomegaly. 4. Cholelithiasis without cholecystitis. 5. Small left pleural effusion with minimal left lower lobe atelectasis. 6. Stable enlarged prostate. 7.  Aortic Atherosclerosis (ICD10-I70.0). Electronically Signed   By: Randa Ngo M.D.   On: 03/23/2020 20:44    Scheduled Meds: . divalproex  125 mg Oral QHS  . docusate sodium  100  mg Oral BID  . insulin aspart  0-9 Units Subcutaneous Q4H  . senna  1 tablet Oral BID   Continuous Infusions: . sodium chloride 50 mL/hr at 03/24/20 0834  . ceFEPime (MAXIPIME) IV 2 g (03/24/20 1114)  . lactated ringers Stopped (03/24/20 0212)  . metronidazole Stopped (03/24/20 0836)  . vancomycin      Assessment/Plan:  1. Fever of 103, acute cystitis with lower abdominal pain and hematuria.  Patient on aggressive antibiotics for right now.  Patient had a few blood pressure readings that were on the lower side will hold Coreg for right now.  Follow-up urine culture.  We will get rid of vancomycin. 2. New diagnosis of cirrhosis, thrombocytopenia, elevated INR, elevated liver function test including total bilirubin.  Patient does not drink alcohol.  We will send off hepatitis profiles, ANA, AMA. 3. Weakness upper and lower extremities.  We will send off a CPK and ESR.  Likely secondary to infection.  Physical therapy evaluation. 4. Dehydration.  Creatinine went from 1.45 down to 1.26 5. BPH on Avodart 6. Impaired fasting glucose.  Patient's hemoglobin A1c is 5.0.  We will discontinue fingersticks. 7. Stage I decubiti present on admission.  See description below  Pressure Injury 02/19/20 Buttocks Mid Stage 1 -  Intact skin with non-blanchable redness of a localized area usually over a bony prominence. (Active)  02/19/20 1219  Location: Buttocks  Location Orientation: Mid  Staging: Stage 1 -  Intact skin with non-blanchable redness of a localized area usually over a bony prominence.  Wound Description (Comments):   Present on Admission: Yes       Code Status:     Code Status Orders  (From admission, onward)         Start     Ordered   03/23/20 2319  Full code  Continuous        03/23/20 2319        Code Status History    Date Active Date Inactive Code Status Order ID Comments User Context   02/19/2020 1241 02/21/2020 1911 DNR [id number]  Ivor Costa, MD ED   02/19/2020 1208  02/19/2020 1241 Full Code [id number]  Ivor Costa, MD ED   02/15/2020 0821 02/16/2020 1722 Full Code [id number]  Blake Divine, MD ED   12/23/2019 1347 12/25/2019 1836 Full Code [id number]  Collier Bullock, MD ED   Advance Care Planning Activity     Family Communication: Wife at the bedside Disposition Plan: Status is: Inpatient  Dispo: The patient is from: Home              Anticipated d/c is to: Home              Patient currently on IV antibiotics.   Difficult to place patient. NO  Antibiotics:  Maxipime  Flagyl  Time spent: 30 minutes  [place]

## 2020-03-24 NOTE — Progress Notes (Signed)
SLP Cancellation Note  Patient Details Name: Dean Lynch MRN: [id number] DOB: Nov 27, 1950   Cancelled treatment:       Reason Eval/Treat Not Completed: SLP screened, no needs identified, will sign off  Chart reviewed, pt, his wife and pt's nurse were interviewed. No acute s/s of aspiration were identified.   Macon Sandiford B. Rutherford Nail M.S., CCC-SLP, [place] [facility] [phone]  Stormy Fabian 03/24/2020, 2:48 PM

## 2020-03-24 NOTE — Evaluation (Signed)
Occupational Therapy Evaluation Patient Details Name: Dean Lynch MRN: [id number] DOB: February 02, 1950 Today's Date: 03/24/2020    History of Present Illness Pt is a 70 y/o M admitted on 03/23/20 with c/c of increasing weakness x 3-4 days. Pt with new diagnosis of cirrhosis. 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 Prior to hospital admission, pt was ambulating with a SPC and generally independent with basic ADL tasks. Wife provides assist for meals, cleaning, med mgt, and recently transportation. Spouse reports pt has had health issues since the start of the year that have impacted his overall mental health and functioning with pt becoming less active per spouse report since February. Pt's spouse reports pt has been "losing interest in activities" and mentions "depression" in addition to noting that the pt has been very cold natured over the past 98mo. Pt pleasant, alert, and oriented x3, follows commands and generally CGA for ADL transfers and increased difficulty and PRN MIN A for LB ADL tasks. Currently pt demonstrates impairments as described below (See OT problem list) which functionally limit his ability to perform ADL/self-care tasks. Pt would benefit from skilled OT services to address noted impairments and functional limitations (see below for any additional details) including both ADL functioning and overall quality of life and engagement in meaningful occupations, in order to maximize safety and independence while minimizing falls risk and caregiver burden. Upon hospital discharge, recommend HHOT to maximize pt safety and return to functional independence during meaningful occupations of daily life. Pt/spouse agreeable.      Follow Up Recommendations  Home health OT;Supervision - Intermittent    Equipment Recommendations  Tub/shower  seat;Tub/shower bench    Recommendations for Other Services       Precautions / Restrictions Precautions Precautions: Fall Restrictions Weight Bearing Restrictions: No      Mobility Bed Mobility Overal bed mobility: Modified Independent                  Transfers Overall transfer level: Needs assistance   Transfers: Sit to/from Stand Sit to Stand: Supervision         General transfer comment: instructional cuing for safe hand placement    Balance Overall balance assessment: Needs assistance Sitting-balance support: No upper extremity supported;Feet supported Sitting balance-Leahy Scale: Good     Standing balance support: No upper extremity supported Standing balance-Leahy Scale: Good                             ADL either performed or assessed with clinical judgement   ADL Overall ADL's : Needs assistance/impaired                                       General ADL Comments: CGA for ADL transfers, PRN MIN A for LB ADL     Vision Baseline Vision/History: Wears glasses Wears Glasses: Reading only Patient Visual Report: No change from baseline       Perception     Praxis      Pertinent Vitals/Pain Pain Assessment: No/denies pain Pain Score: 6  Pain Location: B thighs, hamstrings Pain Descriptors / Indicators: Sore Pain Intervention(s): Limited activity within patient's tolerance;Monitored during session     Hand Dominance Right  Extremity/Trunk Assessment Upper Extremity Assessment Upper Extremity Assessment: Overall WFL for tasks assessed   Lower Extremity Assessment Lower Extremity Assessment: Generalized weakness   Cervical / Trunk Assessment Cervical / Trunk Assessment:  (forward head)   Communication Communication Communication: No difficulties   Cognition Arousal/Alertness: Awake/alert Behavior During Therapy: WFL for tasks assessed/performed Overall Cognitive Status: History of cognitive  impairments - at baseline                                 General Comments: Per spouse/chart, pt has dementia; pt alert and oriented x3 during session (disoriented to situation); follows commands   General Comments  SpO2 >90%    Exercises General Exercises - Lower Extremity Long Arc Quad: AROM;Strengthening;Right;Left;10 reps;Seated Hip Flexion/Marching: AROM;Strengthening;Right;Left;10 reps;Seated Other Exercises Other Exercises: 5x sit<>stand without BUE support with CGA x 2 sets Other Exercises: Pt/spouse educated in role of OT and meaningful engagement in occupation for quality of life as well as independence in daily activities   Shoulder Instructions      Home Living Family/patient expects to be discharged to:: Private residence Living Arrangements: Spouse/significant other Available Help at Discharge: Family;Available PRN/intermittently Type of Home: House Home Access: Stairs to enter [employer] of Steps: 2 Entrance Stairs-Rails:  (can hold to doorframe on R) Home Layout: One level     Bathroom Shower/Tub: Teacher, early years/pre: Standard     Home Equipment: Environmental consultant - 2 wheels;Cane - single point;Other (comment) (rollator)   Additional Comments: rollator      Prior Functioning/Environment Level of Independence: Independent with assistive device(s);Needs assistance  Gait / Transfers Assistance Needed: mod I with SPC ADL's / Homemaking Assistance Needed: pt indep with ADL, standing shower, pt endorses some difficulty with socks/shoes; wife does meal prep, cleaning, and med mgt and recently driving (since January when she reports pt started having increased health issues and functional decline)   Comments: mod I with SPC        OT Problem List: Decreased cognition;Decreased strength;Decreased activity tolerance;Impaired balance (sitting and/or standing);Decreased knowledge of use of DME or AE      OT Treatment/Interventions:  Self-care/ADL training;Therapeutic exercise;Therapeutic activities;Cognitive remediation/compensation;Energy conservation;DME and/or AE instruction;Patient/family education;Balance training    OT Goals(Current goals can be found in the care plan section) Acute Rehab OT Goals Patient Stated Goal: get better, go home OT Goal Formulation: With patient/family Time For Goal Achievement: 04/07/20 Potential to Achieve Goals: Good ADL Goals Additional ADL Goal #1: Pt will perform daily am ADL routine with supervision for safety. Additional ADL Goal #2: Pt/family will verbalize plan to implement at least 1 learned strategy to promote occupational engagement at home in order to maximize quality of life.  OT Frequency: Min 2X/week   Barriers to D/C:            Co-evaluation              AM-PAC OT "6 Clicks" Daily Activity     Outcome Measure Help from another person eating meals?: None Help from another person taking care of personal grooming?: None Help from another person toileting, which includes using toliet, bedpan, or urinal?: A Little Help from another person bathing (including washing, rinsing, drying)?: A Little Help from another person to put on and taking off regular upper body clothing?: None Help from another person to put on and taking off regular lower body clothing?: A Little 6 Click Score: 21   End  of Session Nurse Communication: Other (comment) (male purewick displaced)  Activity Tolerance: Patient tolerated treatment well Patient left: in bed;with call bell/phone within reach;with bed alarm set  OT Visit Diagnosis: Other abnormalities of gait and mobility (R26.89);Muscle weakness (generalized) (M62.81);Other symptoms and signs involving cognitive function                Time: 3142-7670 OT Time Calculation (min): 24 min Charges:  OT General Charges $OT Visit: 1 Visit OT Evaluation $OT Eval Moderate Complexity: 1 Mod OT Treatments $Self Care/Home Management :  8-22 mins  Hanley Hays, MPH, MS, OTR/L ascom [phone] 03/24/20, 5:15 PM

## 2020-03-24 NOTE — ED Notes (Signed)
Bladder scan 178

## 2020-03-25 DIAGNOSIS — N451 Epididymitis: Secondary | ICD-10-CM | POA: Diagnosis not present

## 2020-03-25 DIAGNOSIS — N3001 Acute cystitis with hematuria: Secondary | ICD-10-CM | POA: Diagnosis not present

## 2020-03-25 DIAGNOSIS — L89301 Pressure ulcer of unspecified buttock, stage 1: Secondary | ICD-10-CM

## 2020-03-25 DIAGNOSIS — K7469 Other cirrhosis of liver: Secondary | ICD-10-CM | POA: Diagnosis not present

## 2020-03-25 DIAGNOSIS — N179 Acute kidney failure, unspecified: Secondary | ICD-10-CM | POA: Diagnosis not present

## 2020-03-25 LAB — COMPREHENSIVE METABOLIC PANEL
ALT: 17 U/L (ref 0–44)
AST: 27 U/L (ref 15–41)
Albumin: 2.4 g/dL — ABNORMAL LOW (ref 3.5–5.0)
Alkaline Phosphatase: 59 U/L (ref 38–126)
Anion gap: 6 (ref 5–15)
BUN: 26 mg/dL — ABNORMAL HIGH (ref 8–23)
CO2: 23 mmol/L (ref 22–32)
Calcium: 8.2 mg/dL — ABNORMAL LOW (ref 8.9–10.3)
Chloride: 107 mmol/L (ref 98–111)
Creatinine, Ser: 1.11 mg/dL (ref 0.61–1.24)
GFR, Estimated: >60 mL/min (ref >60)
Glucose, Bld: 196 mg/dL — ABNORMAL HIGH (ref 70–99)
Potassium: 3.9 mmol/L (ref 3.5–5.1)
Sodium: 136 mmol/L (ref 135–145)
Total Bilirubin: 1.9 mg/dL — ABNORMAL HIGH (ref 0.3–1.2)
Total Protein: 4.4 g/dL — ABNORMAL LOW (ref 6.5–8.1)

## 2020-03-25 LAB — CBC
HCT: 24 % — ABNORMAL LOW (ref 39.0–52.0)
Hemoglobin: 9 g/dL — ABNORMAL LOW (ref 13.0–17.0)
MCH: 38 pg — ABNORMAL HIGH (ref 26.0–34.0)
MCHC: 37.5 g/dL — ABNORMAL HIGH (ref 30.0–36.0)
MCV: 101.3 fL — ABNORMAL HIGH (ref 80.0–100.0)
Platelets: 73 10*3/uL — ABNORMAL LOW (ref 150–400)
RBC: 2.37 MIL/uL — ABNORMAL LOW (ref 4.22–5.81)
RDW: 13.4 % (ref 11.5–15.5)
WBC: 6 10*3/uL (ref 4.0–10.5)
nRBC: 0 % (ref 0.0–0.2)

## 2020-03-25 LAB — PROTIME-INR
INR: 1.5 — ABNORMAL HIGH (ref 0.8–1.2)
Prothrombin Time: 17.9 seconds — ABNORMAL HIGH (ref 11.4–15.2)

## 2020-03-25 LAB — GLUCOSE, CAPILLARY: Glucose-Capillary: 186 mg/dL — ABNORMAL HIGH (ref 70–99)

## 2020-03-25 LAB — HEPATITIS B SURFACE ANTIBODY, QUANTITATIVE: Hep B S AB Quant (Post): <3.1 m[IU]/mL — ABNORMAL LOW (ref >9.9)

## 2020-03-25 MED ORDER — SODIUM CHLORIDE 0.9 % IV SOLN
2.0000 g | Freq: Three times a day (TID) | INTRAVENOUS | Status: DC
  Administered 2020-03-25 – 2020-03-26 (×3): 2 g via INTRAVENOUS
  Filled 2020-03-25 (×5): qty 2

## 2020-03-25 NOTE — Progress Notes (Signed)
Patient ID: Dean Lynch, male   DOB: 1950/08/29, 70 y.o.   MRN: [id number] [facility] PROGRESS NOTE  Dean Lynch MOQ:[id number] DOB: 08-Jun-1950 DOA: 03/23/2020 PCP: Toni Arthurs, NP  HPI/Subjective: Patient patient not having the lower abdominal pain that he did yesterday but having some groin pain.  Patient was tender when I palpated his right epididymis.  Feeling stronger today.  Mated with fever acute cystitis.  Objective: Vitals:   03/25/20 1128 03/25/20 1538  BP: 122/61 112/69  Pulse: 75 78  Resp: 16 20  Temp: 99.1 F (37.3 C) 98.3 F (36.8 C)  SpO2: 97% 99%    Intake/Output Summary (Last 24 hours) at 03/25/2020 1552 Last data filed at 03/25/2020 1518 Gross per 24 hour  Intake 240 ml  Output 2500 ml  Net -2260 ml   Filed Weights   03/23/20 1902  Weight: 81.6 kg    ROS: Review of Systems  Respiratory: Negative for shortness of breath.   Cardiovascular: Negative for chest pain.  Gastrointestinal: Negative for abdominal pain, nausea and vomiting.   Exam: Physical Exam HENT:     Head: Normocephalic.     Mouth/Throat:     Pharynx: No oropharyngeal exudate.  Eyes:     General: Lids are normal.     Conjunctiva/sclera: Conjunctivae normal.  Cardiovascular:     Rate and Rhythm: Normal rate and regular rhythm.     Heart sounds: Normal heart sounds, S1 normal and S2 normal.  Pulmonary:     Breath sounds: Normal breath sounds. No decreased breath sounds, wheezing, rhonchi or rales.  Abdominal:     Palpations: Abdomen is soft.     Tenderness: There is no abdominal tenderness.  Genitourinary:    Epididymis:     Right: Tenderness present.     Left: No tenderness.  Musculoskeletal:     Right ankle: No swelling.     Left ankle: No swelling.  Skin:    General: Skin is warm.     Findings: No rash.  Neurological:     Mental Status: He is alert and oriented to person, place, and time.       Data Reviewed: Basic Metabolic Panel: Recent Labs   Lab 03/23/20 1916 03/23/20 1919 03/24/20 0505 03/25/20 0413  NA  --  133* 133* 136  K  --  4.8 4.6 3.9  CL  --  102 105 107  CO2  --  24 21* 23  GLUCOSE  --  133* 105* 196*  BUN  --  33* 33* 26*  CREATININE  --  1.45* 1.26* 1.11  CALCIUM  --  8.8* 8.0* 8.2*  MG 1.7  --  1.9  --   PHOS 2.8  --  3.7  --    Liver Function Tests: Recent Labs  Lab 03/23/20 1919 03/24/20 0505 03/25/20 0413  AST 43* 40 27  ALT 19 16 17   ALKPHOS 55 41 59  BILITOT 3.2* 2.8* 1.9*  PROT 5.8* 4.5* 4.4*  ALBUMIN 3.2* 2.5* 2.4*    Recent Labs  Lab 03/23/20 2117  AMMONIA 27   CBC: Recent Labs  Lab 03/23/20 1919 03/24/20 0434 03/25/20 0413  WBC 7.9 7.1 6.0  NEUTROABS 6.6 5.1  --   HGB 11.0* 8.7* 9.0*  HCT 29.4* 23.9* 24.0*  MCV 100.0 101.7* 101.3*  PLT 78* 87* 73*   Cardiac Enzymes: Recent Labs  Lab 03/23/20 1916 03/24/20 1425  CKTOTAL 295 183   BNP (last 3 results) Recent Labs  12/23/19 1851 02/19/20 0817  BNP 220.7* 23.3     CBG: Recent Labs  Lab 03/24/20 0838 03/24/20 1134 03/24/20 1955 03/24/20 2355 03/25/20 0416  GLUCAP 100* 150* 144* 189* 186*    Recent Results (from the past 240 hour(s))  Resp Panel by RT-PCR (Flu A&B, Covid) Nasopharyngeal Swab     Status: None   Collection Time: 03/23/20  7:16 PM   Specimen: Nasopharyngeal Swab; Nasopharyngeal(NP) swabs in vial transport medium  Result Value Ref Range Status   SARS Coronavirus 2 by RT PCR NEGATIVE NEGATIVE Final    Comment: (NOTE) SARS-CoV-2 target nucleic acids are NOT DETECTED.  The SARS-CoV-2 RNA is generally detectable in upper respiratory specimens during the acute phase of infection. The lowest concentration of SARS-CoV-2 viral copies this assay can detect is 138 copies/mL. A negative result does not preclude SARS-Cov-2 infection and should not be used as the sole basis for treatment or other patient management decisions. A negative result may occur with  improper specimen  collection/handling, submission of specimen other than nasopharyngeal swab, presence of viral mutation(s) within the areas targeted by this assay, and inadequate number of viral copies(<138 copies/mL). A negative result must be combined with clinical observations, patient history, and epidemiological information. The expected result is Negative.  Fact Sheet for Patients:  EntrepreneurPulse.com.au  Fact Sheet for Healthcare Providers:  IncredibleEmployment.be  This test is no t yet approved or cleared by the [place] FDA and  has been authorized for detection and/or diagnosis of SARS-CoV-2 by FDA under an Emergency Use Authorization (EUA). This EUA will remain  in effect (meaning this test can be used) for the duration of the COVID-19 declaration under Section 564(b)(1) of the Act, 21 U.S.C.section 360bbb-3(b)(1), unless the authorization is terminated  or revoked sooner.       Influenza A by PCR NEGATIVE NEGATIVE Final   Influenza B by PCR NEGATIVE NEGATIVE Final    Comment: (NOTE) The Xpert Xpress SARS-CoV-2/FLU/RSV plus assay is intended as an aid in the diagnosis of influenza from Nasopharyngeal swab specimens and should not be used as a sole basis for treatment. Nasal washings and aspirates are unacceptable for Xpert Xpress SARS-CoV-2/FLU/RSV testing.  Fact Sheet for Patients: EntrepreneurPulse.com.au  Fact Sheet for Healthcare Providers: IncredibleEmployment.be  This test is not yet approved or cleared by the [place] FDA and has been authorized for detection and/or diagnosis of SARS-CoV-2 by FDA under an Emergency Use Authorization (EUA). This EUA will remain in effect (meaning this test can be used) for the duration of the COVID-19 declaration under Section 564(b)(1) of the Act, 21 U.S.C. section 360bbb-3(b)(1), unless the authorization is terminated or revoked.  Performed at [facility], [address] [place], [place] [address]   Blood Culture (routine x 2)     Status: None (Preliminary result)   Collection Time: 03/23/20  7:16 PM   Specimen: BLOOD  Result Value Ref Range Status   Specimen Description BLOOD LEFT ANTECUBITAL  Final   Special Requests   Final    BOTTLES DRAWN AEROBIC AND ANAEROBIC Blood Culture adequate volume   Culture   Final    NO GROWTH 2 DAYS Performed at [facility], [address] [place], [place] [address]    Report Status PENDING  Incomplete  Urine culture     Status: Abnormal (Preliminary result)   Collection Time: 03/23/20  7:16 PM   Specimen: In/Out Cath Urine  Result Value Ref Range Status   Specimen Description  Final    IN/OUT CATH URINE Performed at [facility], [address] [place], [place] [address]    Special Requests   Final    NONE Performed at [facility], [place], [place] [address]    Culture (A)  Final    >=100,000 COLONIES/mL ESCHERICHIA COLI 10,000 COLONIES/mL PROTEUS MIRABILIS CULTURE REINCUBATED FOR BETTER GROWTH Performed at [facility], [place] [address] [place], [place] [address]    Report Status PENDING  Incomplete  Blood Culture (routine x 2)     Status: None (Preliminary result)   Collection Time: 03/23/20  7:18 PM   Specimen: BLOOD  Result Value Ref Range Status   Specimen Description BLOOD RIGHT ANTECUBITAL  Final   Special Requests   Final    BOTTLES DRAWN AEROBIC AND ANAEROBIC Blood Culture results may not be optimal due to an excessive volume of blood received in culture bottles   Culture   Final    NO GROWTH 2 DAYS Performed at [facility], [address], [place], [place] [address]    Report Status PENDING  Incomplete  MRSA PCR Screening     Status: None   Collection Time: 03/24/20  2:32 AM   Specimen: Nasopharyngeal  Result Value Ref Range Status   MRSA by PCR NEGATIVE NEGATIVE Final    Comment:        The  GeneXpert MRSA Assay (FDA approved for NASAL specimens only), is one component of a comprehensive MRSA colonization surveillance program. It is not intended to diagnose MRSA infection nor to guide or monitor treatment for MRSA infections. Performed at [facility], [address], [place],  [address]      Studies: [facility] Chest Jal 1 View  Result Date: 03/23/2020 CLINICAL DATA:  Weakness. EXAM: PORTABLE CHEST 1 VIEW COMPARISON:  December 23, 2019. FINDINGS: The heart size and mediastinal contours are within normal limits. No pneumothorax is noted. Right lung is clear. Left-sided pacemaker is noted with leads in grossly good position. Mild left basilar atelectasis or infiltrate is noted with small left pleural effusion. The visualized skeletal structures are unremarkable. IMPRESSION: Mild left basilar atelectasis or infiltrate is noted with small left pleural effusion. Electronically Signed   By: Marijo Conception M.D.   On: 03/23/2020 19:44   CT Renal Stone Study  Result Date: 03/23/2020 CLINICAL DATA:  Dark urine, hematuria EXAM: CT ABDOMEN AND PELVIS WITHOUT CONTRAST TECHNIQUE: Multidetector CT imaging of the abdomen and pelvis was performed following the standard protocol without IV contrast. COMPARISON:  02/19/2020 FINDINGS: Lower chest: Small left pleural effusion with minimal left lower lobe atelectasis. No acute airspace disease. Cardiac pacer identified. Hepatobiliary: Calcified gallstones are identified without cholecystitis. Slight nodularity of the liver capsule unchanged, which may reflect cirrhosis. No focal liver abnormalities on this unenhanced exam. No biliary dilation. Pancreas: Unremarkable. No pancreatic ductal dilatation or surrounding inflammatory changes. Spleen: Stable splenomegaly.  No focal abnormality. Adrenals/Urinary Tract: Stable nonobstructing 3 mm right renal calculus. Left kidney is unremarkable. The adrenals and bladder are normal. Stomach/Bowel: No  bowel obstruction or ileus. Normal appendix right lower quadrant. No bowel wall thickening or inflammatory change. Vascular/Lymphatic: Aortic atherosclerosis. No enlarged abdominal or pelvic lymph nodes. Reproductive: Prostate is enlarged but stable. Central prostate calcifications again noted. Other: No free fluid or free gas.  No abdominal wall hernia. Musculoskeletal: No acute or destructive bony lesions. Reconstructed images demonstrate no additional findings. IMPRESSION: 1. Nonobstructing 3 mm right renal calculus. 2.  Stable hepatic capsular nodularity consistent with cirrhosis. 3. Stable splenomegaly. 4. Cholelithiasis without cholecystitis. 5. Small left pleural effusion with minimal left lower lobe atelectasis. 6. Stable enlarged prostate. 7.  Aortic Atherosclerosis (ICD10-I70.0). Electronically Signed   By: Randa Ngo M.D.   On: 03/23/2020 20:44    Scheduled Meds: . aspirin EC  81 mg Oral Daily  . divalproex  125 mg Oral QHS  . docusate sodium  100 mg Oral BID  . dutasteride  0.5 mg Oral q morning  . feeding supplement  237 mL Oral TID BM  . multivitamin with minerals  1 tablet Oral Daily  . senna  1 tablet Oral BID  . traZODone  50 mg Oral QHS   Continuous Infusions: . sodium chloride 50 mL/hr at 03/25/20 1412  . ceFEPime (MAXIPIME) IV 2 g (03/25/20 1414)    Assessment/Plan:  1. Acute cystitis with lower abdominal pain and hematuria.  Patient also has right epididymitis.  Had fever of 103 on presentation.  Empirically on Maxipime.  Awaiting urine culture to fine-tune antibiotics. 2. New diagnosis of cirrhosis, thrombocytopenia, elevated INR and elevated liver function tests including total bilirubin.  The patient does not drink alcohol.  Hepatitis B and C profile negative.  ANA and AMA pending. 3. Weakness bilateral upper and lower extremities.  Likely secondary to infection.  Physical therapy recommends home health. 4. Acute kidney injury.  Creatinine 1.45 on presentation down  to 1.11 with IV fluid hydration.  (This is a decrease greater than 0.3.) 5. BPH on Avodart 6. Impaired fasting glucose.  Patient's hemoglobin A1c is low at 5.0 even though sugars are high. 7. Stage I decubiti present on admission.  See full description below  Pressure Injury 02/19/20 Buttocks Mid Stage 1 -  Intact skin with non-blanchable redness of a localized area usually over a bony prominence. (Active)  02/19/20 1219  Location: Buttocks  Location Orientation: Mid  Staging: Stage 1 -  Intact skin with non-blanchable redness of a localized area usually over a bony prominence.  Wound Description (Comments):   Present on Admission: Yes       Code Status:     Code Status Orders  (From admission, onward)         Start     Ordered   03/23/20 2319  Full code  Continuous        03/23/20 2319        Code Status History    Date Active Date Inactive Code Status Order ID Comments User Context   02/19/2020 1241 02/21/2020 1911 DNR [id number]  Ivor Costa, MD ED   02/19/2020 1208 02/19/2020 1241 Full Code [id number]  Ivor Costa, MD ED   02/15/2020 0821 02/16/2020 1722 Full Code [id number]  Blake Divine, MD ED   12/23/2019 1347 12/25/2019 1836 Full Code [id number]  Collier Bullock, MD ED   Advance Care Planning Activity     Family Communication: Wife at bedside Disposition Plan: Status is: Inpatient  Dispo: The patient is from: Home              Anticipated d/c is to: Home              Patient currently being treated with IV antibiotics for acute cystitis and acute epididymitis   Difficult to place patient.  No.  Time spent: 27 minutes  [place]

## 2020-03-25 NOTE — TOC Initial Note (Signed)
Transition of Care [facility]) - Initial/Assessment Note    Patient Details  Name: Dean Lynch MRN: [id number] Date of Birth: 05/17/1950  [facility]) CM/SW Contact:    Boris Sharper, LCSW Phone Number: 03/25/2020, 3:15 PM  Clinical Narrative:                 CSW spoke with pt in regards to discharge plans as well as PT/OT recommendations. Pt and spouse were in agreement with [facility] and have had [facility] in the past. CSW gave referral to [place] with [facility] and they were able to accept.   Expected Discharge Plan: [place] Barriers to Discharge: Continued Medical Work up   Patient Goals and CMS Choice Patient states their goals for this hospitalization and ongoing recovery are:: to get [place] going home   Choice offered to / list presented to : [facility]  Expected Discharge Plan and Services Expected Discharge Plan: [place] Choice: [place] arrangements for the past 2 months: [place]: PT,OT [place] Agency: [facility] [place] Date [place]: 03/25/20 Time [place]: [place] Representative spoke with at [place]: Jana Half  Prior Living Arrangements/Services Living arrangements for the past 2 months: [place] with:: Spouse Patient language and need for interpreter reviewed:: Yes Do you feel safe going back to the place where you live?: Yes               Activities of Daily Living Home Assistive Devices/Equipment: Cane (specify quad or straight),Dentures (specify type) ADL Screening (condition at time of admission) Patient's cognitive ability adequate to safely complete daily activities?: Yes Is the patient deaf or have difficulty hearing?: No Does the patient have difficulty seeing, even when wearing glasses/contacts?: No Does the patient have difficulty concentrating, remembering, or  making decisions?: No Patient able to express need for assistance with ADLs?: Yes Does the patient have difficulty dressing or bathing?: Yes Independently performs ADLs?: Yes (appropriate for developmental age) Does the patient have difficulty walking or climbing stairs?: No Weakness of Legs: Both Weakness of Arms/Hands: None  Permission Sought/Granted Permission sought to share information with : Facility Art therapist granted to share information with : Yes, Verbal Permission Granted  Share Information with NAME: Adonis Huguenin     Permission granted to share info w Relationship: spouse  Permission granted to share info w Contact Information: [phone]  Emotional Assessment Appearance:: Other (Comment Required (unable to assess) Attitude/Demeanor/Rapport: Unable to Assess Affect (typically observed): Unable to Assess Orientation: : Oriented to Self,Oriented to Place,Oriented to  Time,Oriented to Situation Alcohol / Substance Use: Not Applicable Psych Involvement: No (comment)  Admission diagnosis:  Acute cystitis [N30.00] UTI (urinary tract infection) [N39.0] Sepsis, due to unspecified organism, unspecified whether acute organ dysfunction present [facility]) [A41.9] Patient Active Problem List   Diagnosis Date Noted  . Acute cystitis 03/24/2020  . Fever   . Abdominal pain, suprapubic   . Other cirrhosis of liver ([place])   . Weakness   . Elevated INR   . UTI (urinary tract infection) 03/23/2020  . Dehydration   . Dementia without behavioral disturbance ([place])   . Type II diabetes mellitus with renal manifestations ([place]) 02/19/2020  . Acute metabolic encephalopathy 00/93/8182  .  Nausea & vomiting 02/19/2020  . Acute renal failure superimposed on stage 3a chronic kidney disease ([place]) 02/19/2020  . Chronic diastolic CHF (congestive heart failure) ([place]) 02/19/2020  . Pressure injury of skin 02/19/2020  . Agitation 02/15/2020  . Bradycardia 12/23/2019  . Acute CHF  (congestive heart failure) ([place]) 12/23/2019  . Thrombocytopenia ([place]) 07/18/2016  . Benign prostatic hyperplasia without lower urinary tract symptoms 07/16/2016  . History of melanoma 12/06/2015  . Diabetes mellitus type 2, uncomplicated ([place]) 10/13/1217  . 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 tophus 12/15/2013  . Essential hypertension 12/15/2013  . Hypercholesterolemia 12/15/2013   PCP:  Toni Arthurs, NP Pharmacy:   [facility] [address], [place] - [place] AT [facility] [address] [place] [address] Phone: [phone] Fax: [phone]     Social Determinants of Health (SDOH) Interventions    Readmission Risk Interventions No flowsheet data found.

## 2020-03-26 ENCOUNTER — Inpatient Hospital Stay: Payer: PPO

## 2020-03-26 DIAGNOSIS — N3001 Acute cystitis with hematuria: Secondary | ICD-10-CM | POA: Diagnosis not present

## 2020-03-26 DIAGNOSIS — M25562 Pain in left knee: Secondary | ICD-10-CM

## 2020-03-26 DIAGNOSIS — K7469 Other cirrhosis of liver: Secondary | ICD-10-CM | POA: Diagnosis not present

## 2020-03-26 DIAGNOSIS — N451 Epididymitis: Secondary | ICD-10-CM | POA: Diagnosis not present

## 2020-03-26 DIAGNOSIS — R7301 Impaired fasting glucose: Secondary | ICD-10-CM

## 2020-03-26 LAB — MITOCHONDRIAL ANTIBODIES: Mitochondrial M2 Ab, IgG: <20 Units (ref 0.0–20.0)

## 2020-03-26 LAB — URIC ACID: Uric Acid, Serum: 4.1 mg/dL (ref 3.7–8.6)

## 2020-03-26 MED ORDER — COLCHICINE 0.6 MG PO TABS
0.6000 mg | ORAL_TABLET | Freq: Once | ORAL | Status: AC
  Administered 2020-03-26: 12:00:00 0.6 mg via ORAL
  Filled 2020-03-26: qty 1

## 2020-03-26 MED ORDER — TRIAMCINOLONE ACETONIDE 40 MG/ML IJ SUSP
40.0000 mg | Freq: Once | INTRAMUSCULAR | Status: AC
  Administered 2020-03-27: 40 mg via INTRA_ARTICULAR
  Filled 2020-03-26 (×2): qty 1

## 2020-03-26 MED ORDER — SODIUM CHLORIDE 0.9 % IV SOLN
1.0000 g | INTRAVENOUS | Status: DC
  Administered 2020-03-26: 12:00:00 1 g via INTRAVENOUS
  Filled 2020-03-26: qty 1
  Filled 2020-03-26: qty 10

## 2020-03-26 MED ORDER — FEBUXOSTAT 40 MG PO TABS
40.0000 mg | ORAL_TABLET | Freq: Every day | ORAL | Status: DC
  Administered 2020-03-27: 40 mg via ORAL
  Filled 2020-03-26: qty 1

## 2020-03-26 MED ORDER — BUPIVACAINE HCL (PF) 0.5 % IJ SOLN
10.0000 mL | Freq: Once | INTRAMUSCULAR | Status: AC
  Administered 2020-03-27: 10 mL
  Filled 2020-03-26 (×2): qty 10

## 2020-03-26 MED ORDER — COLCHICINE 0.6 MG PO TABS
0.6000 mg | ORAL_TABLET | ORAL | Status: AC
  Administered 2020-03-26: 12:00:00 0.6 mg via ORAL
  Filled 2020-03-26: qty 1

## 2020-03-26 NOTE — Progress Notes (Signed)
Patient ID: Dean Lynch, male   DOB: December 17, 1950, 70 y.o.   MRN: [id number] [facility] PROGRESS NOTE  VIET KEMMERER TGY:[id number] DOB: May 15, 1950 DOA: 03/23/2020 PCP: Toni Arthurs, NP  HPI/Subjective: Patient today having acute left knee pain left lateral knee.  Hurts when he bends it and unable to bend it completely.  Hurts when he is ambulating.  Patient still having pain in his right testicle.  No burning on urination.  Patient admitted with acute cystitis and lower abdominal pain and hematuria.  Objective: Vitals:   03/26/20 0731 03/26/20 1206  BP: (!) 144/70 124/60  Pulse: 74 72  Resp: 19 18  Temp: 98.1 F (36.7 C) 98 F (36.7 C)  SpO2: 97% 98%    Intake/Output Summary (Last 24 hours) at 03/26/2020 1300 Last data filed at 03/26/2020 1028 Gross per 24 hour  Intake 2272.3 ml  Output 1075 ml  Net 1197.3 ml   Filed Weights   03/23/20 1902  Weight: 81.6 kg    ROS: Review of Systems  Respiratory: Negative for shortness of breath.   Cardiovascular: Negative for chest pain.  Gastrointestinal: Negative for abdominal pain, nausea and vomiting.  Musculoskeletal: Positive for joint pain.   Exam: Physical Exam HENT:     Head: Normocephalic.     Mouth/Throat:     Pharynx: No oropharyngeal exudate.  Eyes:     General: Lids are normal.     Conjunctiva/sclera: Conjunctivae normal.     Pupils: Pupils are equal, round, and reactive to light.  Cardiovascular:     Rate and Rhythm: Normal rate and regular rhythm.     Heart sounds: Normal heart sounds, S1 normal and S2 normal.  Pulmonary:     Breath sounds: Normal breath sounds. No decreased breath sounds, wheezing, rhonchi or rales.  Abdominal:     Palpations: Abdomen is soft.     Tenderness: There is no abdominal tenderness.  Genitourinary:    Epididymis:     Right: Tenderness present.  Musculoskeletal:     Left knee: Decreased range of motion.     Right lower leg: No swelling.     Left lower leg: No  swelling.     Comments: Pain went palpating lateral joint line left knee.  Skin:    General: Skin is warm.     Findings: No rash.  Neurological:     Mental Status: He is alert and oriented to person, place, and time.       Data Reviewed: Basic Metabolic Panel: Recent Labs  Lab 03/23/20 1916 03/23/20 1919 03/24/20 0505 03/25/20 0413  NA  --  133* 133* 136  K  --  4.8 4.6 3.9  CL  --  102 105 107  CO2  --  24 21* 23  GLUCOSE  --  133* 105* 196*  BUN  --  33* 33* 26*  CREATININE  --  1.45* 1.26* 1.11  CALCIUM  --  8.8* 8.0* 8.2*  MG 1.7  --  1.9  --   PHOS 2.8  --  3.7  --    Liver Function Tests: Recent Labs  Lab 03/23/20 1919 03/24/20 0505 03/25/20 0413  AST 43* 40 27  ALT 19 16 17   ALKPHOS 55 41 59  BILITOT 3.2* 2.8* 1.9*  PROT 5.8* 4.5* 4.4*  ALBUMIN 3.2* 2.5* 2.4*    Recent Labs  Lab 03/23/20 2117  AMMONIA 27   CBC: Recent Labs  Lab 03/23/20 1919 03/24/20 0434 03/25/20 0413  WBC 7.9 7.1  6.0  NEUTROABS 6.6 5.1  --   HGB 11.0* 8.7* 9.0*  HCT 29.4* 23.9* 24.0*  MCV 100.0 101.7* 101.3*  PLT 78* 87* 73*   Cardiac Enzymes: Recent Labs  Lab 03/23/20 1916 03/24/20 1425  CKTOTAL 295 183   BNP (last 3 results) Recent Labs    12/23/19 1851 02/19/20 0817  BNP 220.7* 23.3     CBG: Recent Labs  Lab 03/24/20 0838 03/24/20 1134 03/24/20 1955 03/24/20 2355 03/25/20 0416  GLUCAP 100* 150* 144* 189* 186*    Recent Results (from the past 240 hour(s))  Resp Panel by RT-PCR (Flu A&B, Covid) Nasopharyngeal Swab     Status: None   Collection Time: 03/23/20  7:16 PM   Specimen: Nasopharyngeal Swab; Nasopharyngeal(NP) swabs in vial transport medium  Result Value Ref Range Status   SARS Coronavirus 2 by RT PCR NEGATIVE NEGATIVE Final    Comment: (NOTE) SARS-CoV-2 target nucleic acids are NOT DETECTED.  The SARS-CoV-2 RNA is generally detectable in upper respiratory specimens during the acute phase of infection. The lowest concentration of  SARS-CoV-2 viral copies this assay can detect is 138 copies/mL. A negative result does not preclude SARS-Cov-2 infection and should not be used as the sole basis for treatment or other patient management decisions. A negative result may occur with  improper specimen collection/handling, submission of specimen other than nasopharyngeal swab, presence of viral mutation(s) within the areas targeted by this assay, and inadequate number of viral copies(<138 copies/mL). A negative result must be combined with clinical observations, patient history, and epidemiological information. The expected result is Negative.  Fact Sheet for Patients:  EntrepreneurPulse.com.au  Fact Sheet for Healthcare Providers:  IncredibleEmployment.be  This test is no t yet approved or cleared by the [place] FDA and  has been authorized for detection and/or diagnosis of SARS-CoV-2 by FDA under an Emergency Use Authorization (EUA). This EUA will remain  in effect (meaning this test can be used) for the duration of the COVID-19 declaration under Section 564(b)(1) of the Act, 21 U.S.C.section 360bbb-3(b)(1), unless the authorization is terminated  or revoked sooner.       Influenza A by PCR NEGATIVE NEGATIVE Final   Influenza B by PCR NEGATIVE NEGATIVE Final    Comment: (NOTE) The Xpert Xpress SARS-CoV-2/FLU/RSV plus assay is intended as an aid in the diagnosis of influenza from Nasopharyngeal swab specimens and should not be used as a sole basis for treatment. Nasal washings and aspirates are unacceptable for Xpert Xpress SARS-CoV-2/FLU/RSV testing.  Fact Sheet for Patients: EntrepreneurPulse.com.au  Fact Sheet for Healthcare Providers: IncredibleEmployment.be  This test is not yet approved or cleared by the [place] FDA and has been authorized for detection and/or diagnosis of SARS-CoV-2 by FDA under an Emergency Use  Authorization (EUA). This EUA will remain in effect (meaning this test can be used) for the duration of the COVID-19 declaration under Section 564(b)(1) of the Act, 21 U.S.C. section 360bbb-3(b)(1), unless the authorization is terminated or revoked.  Performed at [facility], [address] [place],  [address]   Blood Culture (routine x 2)     Status: None (Preliminary result)   Collection Time: 03/23/20  7:16 PM   Specimen: BLOOD  Result Value Ref Range Status   Specimen Description BLOOD LEFT ANTECUBITAL  Final   Special Requests   Final    BOTTLES DRAWN AEROBIC AND ANAEROBIC Blood Culture adequate volume   Culture   Final    NO GROWTH 3 DAYS Performed at  [facility], [address] [place], [place] [address]    Report Status PENDING  Incomplete  Urine culture     Status: Abnormal (Preliminary result)   Collection Time: 03/23/20  7:16 PM   Specimen: In/Out Cath Urine  Result Value Ref Range Status   Specimen Description   Final    IN/OUT CATH URINE Performed at [facility], [address], [place], [place] [address]    Special Requests   Final    NONE Performed at [facility], [address] [place], [place] [address]    Culture (A)  Final    >=100,000 COLONIES/mL ESCHERICHIA COLI 10,000 COLONIES/mL PROTEUS MIRABILIS SUSCEPTIBILITIES TO FOLLOW Performed at [facility], [place] [address], [place], [place] [address]    Report Status PENDING  Incomplete   Organism ID, Bacteria ESCHERICHIA COLI (A)  Final      Susceptibility   Escherichia coli - MIC*    AMPICILLIN >=32 RESISTANT Resistant     CEFAZOLIN <=4 SENSITIVE Sensitive     CEFEPIME <=0.12 SENSITIVE Sensitive     CEFTRIAXONE <=0.25 SENSITIVE Sensitive     CIPROFLOXACIN <=0.25 SENSITIVE Sensitive     GENTAMICIN <=1 SENSITIVE Sensitive     IMIPENEM <=0.25 SENSITIVE Sensitive     NITROFURANTOIN <=16 SENSITIVE Sensitive     TRIMETH/SULFA <=20 SENSITIVE  Sensitive     AMPICILLIN/SULBACTAM >=32 RESISTANT Resistant     PIP/TAZO <=4 SENSITIVE Sensitive     * >=100,000 COLONIES/mL ESCHERICHIA COLI  Blood Culture (routine x 2)     Status: None (Preliminary result)   Collection Time: 03/23/20  7:18 PM   Specimen: BLOOD  Result Value Ref Range Status   Specimen Description BLOOD RIGHT ANTECUBITAL  Final   Special Requests   Final    BOTTLES DRAWN AEROBIC AND ANAEROBIC Blood Culture results may not be optimal due to an excessive volume of blood received in culture bottles   Culture   Final    NO GROWTH 3 DAYS Performed at [facility], [address] [place], [place] [address]    Report Status PENDING  Incomplete  MRSA PCR Screening     Status: None   Collection Time: 03/24/20  2:32 AM   Specimen: Nasopharyngeal  Result Value Ref Range Status   MRSA by PCR NEGATIVE NEGATIVE Final    Comment:        The GeneXpert MRSA Assay (FDA approved for NASAL specimens only), is one component of a comprehensive MRSA colonization surveillance program. It is not intended to diagnose MRSA infection nor to guide or monitor treatment for MRSA infections. Performed at [facility], [address] [place], [place] [address]      Scheduled Meds: . aspirin EC  81 mg Oral Daily  . divalproex  125 mg Oral QHS  . docusate sodium  100 mg Oral BID  . dutasteride  0.5 mg Oral q morning  . [START ON 03/27/2020] febuxostat  40 mg Oral Daily  . feeding supplement  237 mL Oral TID BM  . multivitamin with minerals  1 tablet Oral Daily  . senna  1 tablet Oral BID  . traZODone  50 mg Oral QHS   Continuous Infusions: . cefTRIAXone (ROCEPHIN)  IV 1 g (03/26/20 1218)    Assessment/Plan:  1. Acute left knee pain.  Potential gout.  I ordered colchicine 1 dose and another dose a few hours later.  Ordered a dose of Solu-Medrol.  Left knee x-ray ordered.  Patient requested orthopedics to see.  Uric acid level normal range. 2. Acute cystitis  with lower abdominal pain and hematuria.  Patient also has right epididymitis.  E. coli growing out of the urine culture.  Proteus also in the urine culture.  Change Maxipime over to Rocephin. 3. New diagnosis of cirrhosis with chronic thrombocytopenia, elevated INR and elevated liver function test including total bilirubin.  The patient does not drink alcohol.  Hepatitis profiles are negative.  ANA and AMA pending. 4. Acute kidney injury.  Creatinine 1.45 on presentation and down to 1.11 as of yesterday.  (This is a decrease greater than 0.3). 5. BPH on Avodart 6. Impaired fasting glucose.  Hemoglobin A1c is low at 5.0. 7. Stage I decubiti present on admission.  Full description below.  Pressure Injury 02/19/20 Buttocks Mid Stage 1 -  Intact skin with non-blanchable redness of a localized area usually over a bony prominence. (Active)  02/19/20 1219  Location: Buttocks  Location Orientation: Mid  Staging: Stage 1 -  Intact skin with non-blanchable redness of a localized area usually over a bony prominence.  Wound Description (Comments):   Present on Admission: Yes       Code Status:     Code Status Orders  (From admission, onward)         Start     Ordered   03/23/20 2319  Full code  Continuous        03/23/20 2319        Code Status History    Date Active Date Inactive Code Status Order ID Comments User Context   02/19/2020 1241 02/21/2020 1911 DNR [id number]  Ivor Costa, MD ED   02/19/2020 1208 02/19/2020 1241 Full Code [id number]  Ivor Costa, MD ED   02/15/2020 0821 02/16/2020 1722 Full Code [id number]  Blake Divine, MD ED   12/23/2019 1347 12/25/2019 1836 Full Code [id number]  Collier Bullock, MD ED   Advance Care Planning Activity     Family Communication: Spoke with wife at the bedside Disposition Plan: Status is: Inpatient  Dispo: The patient is from: Home              Anticipated d/c is to: Home              Patient currently now with acute left knee pain and I will give  colchicine and a dose of Solu-Medrol.   Difficult to place patient.  No.  Time spent: 28 minutes  [place]

## 2020-03-27 DIAGNOSIS — N451 Epididymitis: Secondary | ICD-10-CM | POA: Diagnosis not present

## 2020-03-27 DIAGNOSIS — N179 Acute kidney failure, unspecified: Secondary | ICD-10-CM | POA: Diagnosis not present

## 2020-03-27 DIAGNOSIS — M25562 Pain in left knee: Secondary | ICD-10-CM | POA: Diagnosis not present

## 2020-03-27 DIAGNOSIS — N3001 Acute cystitis with hematuria: Secondary | ICD-10-CM | POA: Diagnosis not present

## 2020-03-27 LAB — URINE CULTURE: Culture: >=100000 — AB

## 2020-03-27 MED ORDER — CEPHALEXIN 500 MG PO CAPS: 500.0000 mg | ORAL_CAPSULE | Freq: Three times a day (TID) | ORAL | 0 refills | Status: AC

## 2020-03-27 MED ORDER — CEFAZOLIN SODIUM-DEXTROSE 1-4 GM/50ML-% IV SOLN
1.0000 g | Freq: Three times a day (TID) | INTRAVENOUS | Status: DC
  Filled 2020-03-27 (×2): qty 50

## 2020-03-27 MED ORDER — ENSURE ENLIVE PO LIQD: 237.0000 mL | Freq: Three times a day (TID) | ORAL | 12 refills | Status: AC

## 2020-03-27 MED ORDER — COLCHICINE 0.6 MG PO TABS
0.6000 mg | ORAL_TABLET | Freq: Every day | ORAL | Status: DC
  Administered 2020-03-27: 0.6 mg via ORAL
  Filled 2020-03-27: qty 1

## 2020-03-27 MED ORDER — COLCHICINE 0.6 MG PO TABS: ORAL_TABLET | ORAL | 0 refills | Status: DC

## 2020-03-27 MED ORDER — SODIUM CHLORIDE 0.9 % IV SOLN
1.0000 g | Freq: Three times a day (TID) | INTRAVENOUS | Status: DC
  Filled 2020-03-27 (×2): qty 10

## 2020-03-27 MED ORDER — DOCUSATE SODIUM 100 MG PO CAPS: 100.0000 mg | ORAL_CAPSULE | Freq: Two times a day (BID) | ORAL | 0 refills | Status: DC | PRN

## 2020-03-27 NOTE — Procedures (Signed)
After informed consent was obtained the anterior lateral aspect of the knee was prepped with Betadine.  A 25-gauge needle was inserted into the knee and 40 mg Kenalog 1 cc 5 cc Marcaine injected without difficulty Band-Aid applied and tolerated well.

## 2020-03-27 NOTE — Consult Note (Signed)
Reason for Consult: Severe left knee pain Referring Physician: Dr. [name] is an 70 y.o. male.  HPI: Patient currently hospitalized with medical problems and is having significant left knee pain.  He is scheduled to be seen in another week and a half in our office for this problem.  It is limiting his ability to get mobilized as he is improved.  He had x-rays yesterday that show severe DJD with extensive chondrocalcinosis with history of gout but uric acid is normal.  Past Medical History:  Diagnosis Date  . BPH (benign prostatic hyperplasia)   . Diabetes mellitus without complication ([place])   . Gout   . Hypercholesteremia   . Hypertension   . Skin cancer 2017   melanoa- removed at [facility]  . Thrombocytopenia ([place])     Past Surgical History:  Procedure Laterality Date  . CATARACT EXTRACTION W/PHACO Right 09/27/2015   Procedure: CATARACT EXTRACTION PHACO AND INTRAOCULAR LENS PLACEMENT (IOC);  Surgeon: Dean Koyanagi, MD;  Location: [facility];  Service: Ophthalmology;  Laterality: Right;  DIABETIC  . KNEE ARTHROSCOPY    . PACEMAKER IMPLANT N/A 12/24/2019   Procedure: PACEMAKER IMPLANT;  Surgeon: Dean Board, MD;  Location: [facility];  Service: Cardiovascular;  Laterality: N/A;    Family History  Problem Relation Age of Onset  . Coronary artery disease Mother   . Polycythemia Mother   . Macular degeneration Mother   . Dementia Mother   . Coronary artery disease Father     Social History:  reports that he has never smoked. He has never used smokeless tobacco. He reports that he does not drink alcohol and does not use drugs.  Allergies: No Known Allergies  Medications: I have reviewed the patient's current medications.  Results for orders placed or performed during the hospital encounter of 03/23/20 (from the past 48 hour(s))  Uric acid     Status: None   Collection Time: 03/26/20 10:40 AM  Result Value Ref Range   Uric Acid, Serum  4.1 3.7 - 8.6 mg/dL    Comment: Performed at [facility], [address] [place], [place] [address]    DG Knee 3 Views Left  Result Date: 03/26/2020 CLINICAL DATA:  Knee pain EXAM: LEFT KNEE - 3 VIEW COMPARISON:  Report MRI from 01/19/2000 FINDINGS: Meniscal chondrocalcinosis potentially with calcifications along the joint capsule. Tricompartmental spurring. Small effusion in the suprapatellar bursa. No fracture or acute bony findings. IMPRESSION: 1. Tricompartmental spurring. 2. Meniscal chondrocalcinosis, potentially with calcifications along the joint capsule. 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 3. Small effusion in the suprapatellar bursa. Electronically Signed   By: Dean Lynch M.D.   On: 03/26/2020 15:26    Review of Systems Blood pressure 123/64, pulse 68, temperature 97.9 F (36.6 C), resp. rate 20, height 5\' 10"  (1.778 m), weight 81.6 kg, SpO2 97 %. Physical Exam Varus deformity with crepitation range of motion with moderate swelling today.  He has range of motion approximately 5 to 50 degrees with pain but at 50 degrees. 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

## 2020-03-27 NOTE — Care Management Important Message (Signed)
Important Message  Patient Details  Name: Dean Lynch MRN: [id number] Date of Birth: 02/08/1950   Medicare Important Message Given:  N/A - LOS <3 / Initial given by admissions     Juliann Pulse A Teancum Brule 03/27/2020, 7:56 AM

## 2020-03-27 NOTE — TOC Transition Note (Signed)
Transition of Care [facility]) - CM/SW Discharge Note   Patient Details  Name: Dean Lynch MRN: [id number] Date of Birth: 10-19-1950  [facility] CM[facility]SW Contact:  Shelbie Hutching, RN Phone Number: 03/27/2020, 10:22 AM   Clinical Narrative:    Patient medically cleared for discharge home with home health services through [facility].  [place] with [facility] is aware of discharge today.  MD will write orders for RN, PT, and OT.  Patient is current with his PCP and has an appointment scheduled for early April.  Wife is at the bedside and will transport patient home today.    Final next level of care: [place] Barriers to Discharge: Barriers Resolved   Patient Goals and CMS Choice Patient states their goals for this hospitalization and ongoing recovery are:: to get [place] going home CMS Medicare.gov Compare Post Acute Care list provided to:: Patient Choice offered to / list presented to : [facility]  Discharge Placement                       Discharge Plan and Services     Post Acute Care Choice: Home Health          DME Arranged: N/A DME Agency: NA       HH Arranged: RN,PT,OT [place] Agency: [facility] [place] Date [place] Agency Contacted: 03/27/20 Time [place]: 1021 Representative spoke with at [place]: [place]  Social Determinants of Health ([place]) Interventions     Readmission Risk Interventions No flowsheet data found.

## 2020-03-27 NOTE — Discharge Summary (Addendum)
[facility] NAME: Dean Lynch    MR#:  [id number]  DATE OF BIRTH:  04-21-1950  DATE OF ADMISSION:  03/23/2020 ADMITTING PHYSICIAN: Loletha Grayer, MD  DATE OF DISCHARGE: 03/27/2020  1:40 PM  PRIMARY CARE PHYSICIAN: Toni Arthurs, NP    ADMISSION DIAGNOSIS:  Acute cystitis [N30.00] UTI (urinary tract infection) [N39.0] Sepsis, due to unspecified organism, unspecified whether acute organ dysfunction present ([place]) [A41.9]  DISCHARGE DIAGNOSIS:  Active Problems:   Essential hypertension   Hypercholesterolemia   Thrombocytopenia (HCC)   Type II diabetes mellitus with renal manifestations (HCC)   AKI (acute kidney injury) ([place])   Chronic diastolic CHF (congestive heart failure) ([place])   Dehydration   UTI (urinary tract infection)   Acute cystitis   Epididymitis, right   Acute pain of left knee   Impaired fasting glucose   SECONDARY DIAGNOSIS:   Past Medical History:  Diagnosis Date  . BPH (benign prostatic hyperplasia)   . Diabetes mellitus without complication ([place])   . Gout   . Hypercholesteremia   . Hypertension   . Skin cancer 2017   melanoa- removed at [facility]  . Thrombocytopenia ([place])     HOSPITAL COURSE:   1.  Acute cystitis with lower abdominal pain and hematuria and right epididymitis.  Urine culture growing out E. coli and also Proteus.  Patient initially was given Maxipime and then changed over to Rocephin and will be changed over to Keflex for 1 more week upon disposition. 2.  Acute left knee pain likely pseudogout.  I ordered 2 doses of colchicine and a dose of Solu-Medrol on 03/26/2020.  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  Dr. Rudene Christians orthopedic surgery injected 40 mg of Kenalog and 5 cc of Marcaine into the left knee on 03/27/2020.  I will give colchicine for a few more days upon going home.  Patient already on Uloric.  Uric  acid level 4.1.  Patient was doing well with regard to his left knee pain. 3.  New diagnosis of cirrhosis with chronic thrombocytopenia, elevated INR and elevated liver function test including total bilirubin.  The patient does not drink alcohol.  Hepatitis profiles are negative.  AMA negative.  ANA still pending.  Refer to gastroenterology as outpatient. 4.  Acute kidney injury.  Creatinine 1.45 on presentation and came down to 1.11.  (This is a decrease of more than 0.3). 5.  BPH on Avodart 6.  Impaired fasting glucose.  Hemoglobin A1c low at 5.0 even though sugars were higher. 7.  Stage I decubiti present on admission.  Patient had intact skin with nonblanchable redness of a localized area over bony prominence on the buttocks 8.  History of chronic diastolic congestive heart failure.  No signs of heart failure during this hospital stay.  We gave IV fluids also. 9.  Home health set up 10.  Blood pressure normal off of his antihypertensive medications we will continue to watch as outpatient. 11. Sepsis ruled out  DISCHARGE CONDITIONS:   Satisfactory  CONSULTS OBTAINED:  Treatment Team:  [name], MD  DRUG ALLERGIES:  No Known Allergies  DISCHARGE MEDICATIONS:   Allergies as of 03/27/2020   No Known Allergies     Medication List    STOP taking these medications   carvedilol 6.25 MG tablet Commonly known as: COREG   lisinopril-hydrochlorothiazide 20-12.5 MG tablet Commonly known as: ZESTORETIC   magnesium oxide 400 MG  tablet Commonly known as: MAG-OX   QUEtiapine 25 MG tablet Commonly known as: SEROquel     TAKE these medications   aspirin EC 81 MG tablet Take 81 mg by mouth daily.   cephALEXin 500 MG capsule Commonly known as: KEFLEX Take 1 capsule (500 mg total) by mouth 3 (three) times daily for 7 days.   colchicine 0.6 MG tablet One tab po Daily for 4 days then as needed for gout after that   divalproex 125 MG DR tablet Commonly known as: DEPAKOTE Take  125 mg by mouth at bedtime. What changed: Another medication with the same name was removed. Continue taking this medication, and follow the directions you see here.   docusate sodium 100 MG capsule Commonly known as: COLACE Take 1 capsule (100 mg total) by mouth 2 (two) times daily as needed for mild constipation.   dutasteride 0.5 MG capsule Commonly known as: AVODART Take 0.5 mg by mouth every morning.   febuxostat 40 MG tablet Commonly known as: ULORIC Take 40 mg by mouth daily.   feeding supplement Liqd Take 237 mLs by mouth 3 (three) times daily between meals.   Fish Oil 1000 MG Caps Take 1,000 mg by mouth daily.   traZODone 50 MG tablet Commonly known as: DESYREL Take 50 mg by mouth at bedtime.   Vitamin D3 50 MCG (2000 UT) capsule Take 2,000 Units by mouth 2 (two) times daily.        DISCHARGE INSTRUCTIONS:   Follow-up PMD 5 days Follow-up gastroenterology in a few weeks  If you experience worsening of your admission symptoms, develop shortness of breath, life threatening emergency, suicidal or homicidal thoughts you must seek medical attention immediately by calling 911 or calling your MD immediately  if symptoms less severe.  You Must read complete instructions/literature along with all the possible adverse reactions/side effects for all the Medicines you take and that have been prescribed to you. Take any new Medicines after you have completely understood and accept all the possible adverse reactions/side effects.   Please note  You were cared for by a hospitalist during your hospital stay. If you have any questions about your discharge medications or the care you received while you were in the hospital after you are discharged, you can call the unit and asked to speak with the hospitalist on call if the hospitalist that took care of you is not available. Once you are discharged, your primary care physician will handle any further medical issues. Please note that  NO REFILLS for any discharge medications will be authorized once you are discharged, as it is imperative that you return to your primary care physician (or establish a relationship with a primary care physician if you do not have one) for your aftercare needs so that they can reassess your need for medications and monitor your lab values.    Today   CHIEF COMPLAINT:   Chief Complaint  Patient presents with  . Weakness    Pt presenting from home via EMS. Per EMS report pt has been having increasing weakness for the past 3-4 days. Usually able to walk with cane but reports he is now unable to walk    HISTORY OF PRESENT ILLNESS:  Dean Lynch  is a 70 y.o. male came in with weakness and found to have urinary infection.   VITAL SIGNS:  Blood pressure 135/66, pulse 66, temperature 98 F (36.7 C), resp. rate 18, height 5\' 10"  (1.778 m), weight 81.6 kg, SpO2 97 %.  I/O:    Intake/Output Summary (Last 24 hours) at 03/27/2020 1821 Last data filed at 03/27/2020 1014 Gross per 24 hour  Intake 580 ml  Output 825 ml  Net -245 ml    PHYSICAL EXAMINATION:  GENERAL:  70 y.o.-year-old patient lying in the bed with no acute distress.  EYES: Pupils equal, round, reactive to light and accommodation. No scleral icterus.  HEENT: Head atraumatic, normocephalic. Oropharynx and nasopharynx clear.  LUNGS: Normal breath sounds bilaterally, no wheezing, rales,rhonchi or crepitation. No use of accessory muscles of respiration.  CARDIOVASCULAR: S1, S2 normal. No murmurs, rubs, or gallops.  ABDOMEN: Soft, non-tender, non-distended.  Only slight discomfort touching the epididymitis on the right. EXTREMITIES: Trace pedal edema.  Able to bend left knee better today than yesterday.  Less pain with movement and palpation. NEUROLOGIC: Cranial nerves II through XII are intact. Muscle strength 5/5 in all extremities. Sensation intact. Gait not checked.  PSYCHIATRIC: The patient is alert and answers questions  appropriately.  SKIN: No obvious rash, lesion, or ulcer.   DATA REVIEW:   CBC Recent Labs  Lab 03/25/20 0413  WBC 6.0  HGB 9.0*  HCT 24.0*  PLT 73*    Chemistries  Recent Labs  Lab 03/24/20 0505 03/25/20 0413  NA 133* 136  K 4.6 3.9  CL 105 107  CO2 21* 23  GLUCOSE 105* 196*  BUN 33* 26*  CREATININE 1.26* 1.11  CALCIUM 8.0* 8.2*  MG 1.9  --   AST 40 27  ALT 16 17  ALKPHOS 41 59  BILITOT 2.8* 1.9*    Microbiology Results  Results for orders placed or performed during the hospital encounter of 03/23/20  Resp Panel by RT-PCR (Flu A&B, Covid) Nasopharyngeal Swab     Status: None   Collection Time: 03/23/20  7:16 PM   Specimen: Nasopharyngeal Swab; Nasopharyngeal(NP) swabs in vial transport medium  Result Value Ref Range Status   SARS Coronavirus 2 by RT PCR NEGATIVE NEGATIVE Final    Comment: (NOTE) SARS-CoV-2 target nucleic acids are NOT DETECTED.  The SARS-CoV-2 RNA is generally detectable in upper respiratory specimens during the acute phase of infection. The lowest concentration of SARS-CoV-2 viral copies this assay can detect is 138 copies/mL. A negative result does not preclude SARS-Cov-2 infection and should not be used as the sole basis for treatment or other patient management decisions. A negative result may occur with  improper specimen collection/handling, submission of specimen other than nasopharyngeal swab, presence of viral mutation(s) within the areas targeted by this assay, and inadequate number of viral copies(<138 copies/mL). A negative result must be combined with clinical observations, patient history, and epidemiological information. The expected result is Negative.  Fact Sheet for Patients:  EntrepreneurPulse.com.au  Fact Sheet for Healthcare Providers:  IncredibleEmployment.be  This test is no t yet approved or cleared by the [place] FDA and  has been authorized for detection and/or  diagnosis of SARS-CoV-2 by FDA under an Emergency Use Authorization (EUA). This EUA will remain  in effect (meaning this test can be used) for the duration of the COVID-19 declaration under Section 564(b)(1) of the Act, 21 U.S.C.section 360bbb-3(b)(1), unless the authorization is terminated  or revoked sooner.       Influenza A by PCR NEGATIVE NEGATIVE Final   Influenza B by PCR NEGATIVE NEGATIVE Final    Comment: (NOTE) The Xpert Xpress SARS-CoV-2/FLU/RSV plus assay is intended as an aid in the diagnosis of influenza from Nasopharyngeal swab specimens and should not be used  as a sole basis for treatment. Nasal washings and aspirates are unacceptable for Xpert Xpress SARS-CoV-2/FLU/RSV testing.  Fact Sheet for Patients: EntrepreneurPulse.com.au  Fact Sheet for Healthcare Providers: IncredibleEmployment.be  This test is not yet approved or cleared by the [place] FDA and has been authorized for detection and/or diagnosis of SARS-CoV-2 by FDA under an Emergency Use Authorization (EUA). This EUA will remain in effect (meaning this test can be used) for the duration of the COVID-19 declaration under Section 564(b)(1) of the Act, 21 U.S.C. section 360bbb-3(b)(1), unless the authorization is terminated or revoked.  Performed at [facility], [address] [place], [place] [address]   Blood Culture (routine x 2)     Status: None (Preliminary result)   Collection Time: 03/23/20  7:16 PM   Specimen: BLOOD  Result Value Ref Range Status   Specimen Description BLOOD LEFT ANTECUBITAL  Final   Special Requests   Final    BOTTLES DRAWN AEROBIC AND ANAEROBIC Blood Culture adequate volume   Culture   Final    NO GROWTH 4 DAYS Performed at [facility], [address], [place], [place] [address]    Report Status PENDING  Incomplete  Urine culture     Status: Abnormal   Collection Time: 03/23/20  7:16 PM   Specimen: In/Out  Cath Urine  Result Value Ref Range Status   Specimen Description   Final    IN/OUT CATH URINE Performed at [facility], [address], [place], [place] [address]    Special Requests   Final    NONE Performed at [facility], [address] [place], [place] [address]    Culture (A)  Final    >=100,000 COLONIES/mL ESCHERICHIA COLI 10,000 COLONIES/mL PROTEUS MIRABILIS    Report Status 03/27/2020 FINAL  Final   Organism ID, Bacteria ESCHERICHIA COLI (A)  Final   Organism ID, Bacteria PROTEUS MIRABILIS (A)  Final      Susceptibility   Escherichia coli - MIC*    AMPICILLIN >=32 RESISTANT Resistant     CEFAZOLIN <=4 SENSITIVE Sensitive     CEFEPIME <=0.12 SENSITIVE Sensitive     CEFTRIAXONE <=0.25 SENSITIVE Sensitive     CIPROFLOXACIN <=0.25 SENSITIVE Sensitive     GENTAMICIN <=1 SENSITIVE Sensitive     IMIPENEM <=0.25 SENSITIVE Sensitive     NITROFURANTOIN <=16 SENSITIVE Sensitive     TRIMETH/SULFA <=20 SENSITIVE Sensitive     AMPICILLIN/SULBACTAM >=32 RESISTANT Resistant     PIP/TAZO <=4 SENSITIVE Sensitive     * >=100,000 COLONIES/mL ESCHERICHIA COLI   Proteus mirabilis - MIC*    AMPICILLIN <=2 SENSITIVE Sensitive     CEFAZOLIN <=4 SENSITIVE Sensitive     CEFEPIME <=0.12 SENSITIVE Sensitive     CEFTRIAXONE <=0.25 SENSITIVE Sensitive     CIPROFLOXACIN <=0.25 SENSITIVE Sensitive     GENTAMICIN <=1 SENSITIVE Sensitive     IMIPENEM 2 SENSITIVE Sensitive     NITROFURANTOIN RESISTANT Resistant     TRIMETH/SULFA <=20 SENSITIVE Sensitive     AMPICILLIN/SULBACTAM <=2 SENSITIVE Sensitive     PIP/TAZO <=4 SENSITIVE Sensitive     * 10,000 COLONIES/mL PROTEUS MIRABILIS  Blood Culture (routine x 2)     Status: None (Preliminary result)   Collection Time: 03/23/20  7:18 PM   Specimen: BLOOD  Result Value Ref Range Status   Specimen Description BLOOD RIGHT ANTECUBITAL  Final   Special Requests   Final    BOTTLES DRAWN AEROBIC AND ANAEROBIC Blood Culture results  may not be optimal due to an excessive volume of blood received in culture bottles   Culture   Final    NO GROWTH 4 DAYS Performed at [facility], [address] [place], [place] [address]    Report Status PENDING  Incomplete  MRSA PCR Screening     Status: None   Collection Time: 03/24/20  2:32 AM   Specimen: Nasopharyngeal  Result Value Ref Range Status   MRSA by PCR NEGATIVE NEGATIVE Final    Comment:        The GeneXpert MRSA Assay (FDA approved for NASAL specimens only), is one component of a comprehensive MRSA colonization surveillance program. It is not intended to diagnose MRSA infection nor to guide or monitor treatment for MRSA infections. Performed at [facility], [address] [place], [place] [address]     RADIOLOGY:  [place] Knee 3 Views Left  Result Date: 03/26/2020 CLINICAL DATA:  Knee pain EXAM: LEFT KNEE - 3 VIEW COMPARISON:  Report MRI from 01/19/2000 FINDINGS: Meniscal chondrocalcinosis potentially with calcifications along the joint capsule. Tricompartmental spurring. Small effusion in the suprapatellar bursa. No fracture or acute bony findings. IMPRESSION: 1. Tricompartmental spurring. 2. Meniscal chondrocalcinosis, potentially with calcifications along the joint capsule. 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 3. Small effusion in the suprapatellar bursa. Electronically Signed   By: Van Clines M.D.   On: 03/26/2020 15:26     Management plans discussed with the patient, family and they are in agreement.  CODE STATUS:     Code Status Orders  (From admission, onward)         Start     Ordered   03/23/20 2319  Full code  Continuous        03/23/20 2319        Code Status History    Date Active Date Inactive Code Status Order ID Comments User Context   02/19/2020 1241 02/21/2020 1911 DNR [id number]  Ivor Costa, MD ED   02/19/2020 1208 02/19/2020 1241 Full Code [id number]  Ivor Costa, MD ED   02/15/2020 0821 02/16/2020 1722 Full Code  [id number]  Blake Divine, MD ED   12/23/2019 1347 12/25/2019 1836 Full Code [id number]  Collier Bullock, MD ED   Advance Care Planning Activity      TOTAL TIME TAKING CARE OF THIS PATIENT: 35 minutes.    Loletha Grayer M.D on 03/27/2020 at 6:21 PM  Between 7am to 6pm - Pager - [phone]  After 6pm go to [email or web address] - password [email or web address] [place]  [facility]  CC: Primary care physician; Toni Arthurs, NP

## 2020-03-28 LAB — CULTURE, BLOOD (ROUTINE X 2)
Culture: NO GROWTH
Culture: NO GROWTH
Special Requests: ADEQUATE

## 2020-04-03 DIAGNOSIS — F0280 Dementia in other diseases classified elsewhere without behavioral disturbance: Secondary | ICD-10-CM | POA: Diagnosis not present

## 2020-04-03 DIAGNOSIS — F0150 Vascular dementia without behavioral disturbance: Secondary | ICD-10-CM | POA: Diagnosis not present

## 2020-04-03 DIAGNOSIS — Z79899 Other long term (current) drug therapy: Secondary | ICD-10-CM | POA: Diagnosis not present

## 2020-04-03 DIAGNOSIS — G309 Alzheimer's disease, unspecified: Secondary | ICD-10-CM | POA: Diagnosis not present

## 2020-04-10 DIAGNOSIS — N390 Urinary tract infection, site not specified: Secondary | ICD-10-CM | POA: Diagnosis not present

## 2020-04-10 DIAGNOSIS — R300 Dysuria: Secondary | ICD-10-CM | POA: Diagnosis not present

## 2020-04-11 DIAGNOSIS — M1A49X0 Other secondary chronic gout, multiple sites, without tophus (tophi): Secondary | ICD-10-CM | POA: Diagnosis not present

## 2020-04-11 DIAGNOSIS — Z950 Presence of cardiac pacemaker: Secondary | ICD-10-CM | POA: Diagnosis not present

## 2020-04-11 DIAGNOSIS — E119 Type 2 diabetes mellitus without complications: Secondary | ICD-10-CM | POA: Diagnosis not present

## 2020-04-11 DIAGNOSIS — F0280 Dementia in other diseases classified elsewhere without behavioral disturbance: Secondary | ICD-10-CM | POA: Diagnosis not present

## 2020-04-11 DIAGNOSIS — G3183 Dementia with Lewy bodies: Secondary | ICD-10-CM | POA: Diagnosis not present

## 2020-04-11 DIAGNOSIS — D696 Thrombocytopenia, unspecified: Secondary | ICD-10-CM | POA: Diagnosis not present

## 2020-04-11 DIAGNOSIS — N1832 Chronic kidney disease, stage 3b: Secondary | ICD-10-CM | POA: Diagnosis not present

## 2020-04-11 DIAGNOSIS — I10 Essential (primary) hypertension: Secondary | ICD-10-CM | POA: Diagnosis not present

## 2020-04-11 DIAGNOSIS — E559 Vitamin D deficiency, unspecified: Secondary | ICD-10-CM | POA: Diagnosis not present

## 2020-04-11 DIAGNOSIS — E7800 Pure hypercholesterolemia, unspecified: Secondary | ICD-10-CM | POA: Diagnosis not present

## 2020-04-14 ENCOUNTER — Telehealth: Payer: Self-pay | Admitting: Nurse Practitioner

## 2020-04-14 NOTE — Telephone Encounter (Signed)
Called patient to offer to schedule a Palliative Consult, no answer - left message with reason for call along with my name and number requesting a return call.

## 2020-04-17 ENCOUNTER — Telehealth: Payer: Self-pay | Admitting: Nurse Practitioner

## 2020-04-17 NOTE — Telephone Encounter (Signed)
Rec'd return call from patient's wife, Adonis Huguenin, regarding the Palliative referral/services.  All questions were answered and she was in agreement with scheduling a visit.  I have scheduled an In-home Consult for 04/26/20 @ 9 AM.

## 2020-04-20 ENCOUNTER — Other Ambulatory Visit: Payer: Self-pay | Admitting: Gastroenterology

## 2020-04-20 DIAGNOSIS — Z86010 Personal history of colonic polyps: Secondary | ICD-10-CM | POA: Diagnosis not present

## 2020-04-20 DIAGNOSIS — K7460 Unspecified cirrhosis of liver: Secondary | ICD-10-CM

## 2020-04-20 DIAGNOSIS — K839 Disease of biliary tract, unspecified: Secondary | ICD-10-CM

## 2020-04-24 DIAGNOSIS — R300 Dysuria: Secondary | ICD-10-CM | POA: Diagnosis not present

## 2020-04-26 ENCOUNTER — Other Ambulatory Visit: Payer: Self-pay | Admitting: Nurse Practitioner

## 2020-04-26 ENCOUNTER — Other Ambulatory Visit: Payer: Self-pay

## 2020-04-26 ENCOUNTER — Ambulatory Visit
Admission: RE | Admit: 2020-04-26 | Discharge: 2020-04-26 | Disposition: A | Discharge status: Home / Self Care | Payer: PPO | Source: Ambulatory Visit | Attending: Gastroenterology | Admitting: Gastroenterology

## 2020-04-26 DIAGNOSIS — K839 Disease of biliary tract, unspecified: Secondary | ICD-10-CM | POA: Diagnosis not present

## 2020-04-26 DIAGNOSIS — K7460 Unspecified cirrhosis of liver: Secondary | ICD-10-CM | POA: Diagnosis not present

## 2020-04-26 DIAGNOSIS — N200 Calculus of kidney: Secondary | ICD-10-CM | POA: Diagnosis not present

## 2020-04-26 DIAGNOSIS — R634 Abnormal weight loss: Secondary | ICD-10-CM | POA: Diagnosis not present

## 2020-04-26 DIAGNOSIS — K8020 Calculus of gallbladder without cholecystitis without obstruction: Secondary | ICD-10-CM | POA: Diagnosis not present

## 2020-04-26 MED ORDER — IOHEXOL 300 MG/ML  SOLN
75.0000 mL | Freq: Once | INTRAMUSCULAR | Status: AC | PRN
  Administered 2020-04-26: 75 mL via INTRAVENOUS

## 2020-04-27 DIAGNOSIS — E7800 Pure hypercholesterolemia, unspecified: Secondary | ICD-10-CM | POA: Diagnosis not present

## 2020-04-27 DIAGNOSIS — F0280 Dementia in other diseases classified elsewhere without behavioral disturbance: Secondary | ICD-10-CM | POA: Diagnosis not present

## 2020-04-27 DIAGNOSIS — I10 Essential (primary) hypertension: Secondary | ICD-10-CM | POA: Diagnosis not present

## 2020-04-27 DIAGNOSIS — I441 Atrioventricular block, second degree: Secondary | ICD-10-CM | POA: Diagnosis not present

## 2020-04-27 DIAGNOSIS — G300 Alzheimer's disease with early onset: Secondary | ICD-10-CM | POA: Diagnosis not present

## 2020-04-27 DIAGNOSIS — R531 Weakness: Secondary | ICD-10-CM | POA: Diagnosis not present

## 2020-04-27 DIAGNOSIS — Z950 Presence of cardiac pacemaker: Secondary | ICD-10-CM | POA: Diagnosis not present

## 2020-04-27 DIAGNOSIS — D696 Thrombocytopenia, unspecified: Secondary | ICD-10-CM | POA: Diagnosis not present

## 2020-05-10 ENCOUNTER — Other Ambulatory Visit: Payer: PPO | Admitting: Nurse Practitioner

## 2020-05-10 ENCOUNTER — Encounter: Payer: Self-pay | Admitting: Nurse Practitioner

## 2020-05-10 ENCOUNTER — Other Ambulatory Visit: Payer: Self-pay

## 2020-05-10 DIAGNOSIS — R5381 Other malaise: Secondary | ICD-10-CM | POA: Diagnosis not present

## 2020-05-10 DIAGNOSIS — Z515 Encounter for palliative care: Secondary | ICD-10-CM

## 2020-05-10 NOTE — Progress Notes (Signed)
[facility] [facility] Palliative Care Consult Note Telephone: [phone]  Fax: [phone]    Date of encounter: 05/10/20 PATIENT NAME: Dean Lynch [place] [id number]   [phone] (home)  DOB: 1950-08-02 MRN: [id number] PRIMARY CARE PROVIDER:    Toni Arthurs, NP,  [place] [address] [phone]  RESPONSIBLE PARTY:    Contact Information    Name Relation Home Work [address]   Dean, Lynch [phone]  [phone]     I met face to face with patient and family in home. Palliative Care was asked to follow this patient by consultation request of  Dean Arthurs, NP to address advance care planning and complex medical decision making. This is the initial visit   ASSESSMENT AND PLAN / RECOMMENDATIONS:   Advance Care Planning/Goals of Care: Goals include to maximize quality of life and symptom management. Our advance care planning conversation included a discussion about:     The value and importance of advance care planning   Experiences with loved ones who have been seriously ill or have died   Exploration of personal, cultural or spiritual beliefs that might influence medical decisions   Exploration of goals of care in the event of a sudden injury or illness   Identification and preparation of a healthcare agent   Review and updating or creation of an  advance directive document .  Decision not to resuscitate or to de-escalate disease focused treatments due to poor prognosis.  CODE STATUS: DNR  Symptom Management/Plan: 1. ACP: DNR in place; in vynca; continue treat what is treatable  2. Debility secondary to dementia; encourage mobility, self independence as able  3. Palliative care encounter; Palliative care encounter; Palliative medicine team will continue to support patient, patient's family, and medical team. Visit consisted of counseling and education dealing with the complex  and emotionally intense issues of symptom management and palliative care in the setting of serious and potentially life-threatening illness  Follow up Palliative Care Visit: Palliative care will continue to follow for complex medical decision making, advance care planning, and clarification of goals. Return 4 weeks or prn.  I spent 75 minutes providing this consultation. More than 50% of the time in this consultation was spent in counseling and care coordination.  PPS: 50%  HOSPICE ELIGIBILITY/DIAGNOSIS: TBD  Chief Complaint: Initial palliative consult for complex medical decision making  HISTORY OF PRESENT ILLNESS:  Dean Lynch is a 70 y.o. year old male  with Dementia, Chronic diastolic congestive heart failure, cirrhosis of liver, Diabetes, hypertension, hypercholesterolemia, BPH, gout, thrombocytopenia, Melanoma removed 2017, pacemaker implant, right cataract extraction with PHACO. Hospitalization 03/23/2020 to 03/27/2020 for acute cystitis with lower abdominal pain and he materia right epididymis growing E coli and Proteus giving antibiotics. Acute left knee pain likely pseudogout. New diagnosis of cirrhosis with chronic thrombocytopenia. Acute kidney injury creatinine 1.4 on presentation trended down to 1.11. Stage 1 deubitus. DNR in vynca. Seen by Dr Manuella Ghazi Neurology 04/03/2020 for Dementia likely multifactorial with components of frontal temporal dementia versus lewy bodies with hallucinations agitation MRI showingmild white matter microvascular ischaemia and metabolic changes and parenchymal atrophy. Depakote prescribed for behaviors. Home health orders for PT, OT, speech, respite care, social worker. Seen by Gastrology 04/20/2020 for new diagnosis liver cirrhosis/dilated proximal CBD with plan for Assess further autoimmune, hereditary, acquired, issues. Pacemaker which seemed to preclude MCRP- transaminsesn alp normal, mild elevated total bilirubin. Assess triphasic CT no abdominal pain. GFR  with chronic kidney  disease improved recently. History of : polyps next visit they will discuss colonoscopy. I called [name] to confirm palliative care initial visit and COVID screening which was negative. I visited [name] in their home. [name] was ambulatory and does use a cane when he is walking outside. We talked about past medical history. We talked about chronic disease progression of dementia, new diagnosis of liver cirrhosis, recent hospitalization. We talked about [name] overall cognitive decline with progression. [name] talked at length about medications to include depakote for which he currently continues to take. [name] endorses that Seroquel was ordered but she had not started it as of yet. We talked about behaviors. [name] gave examples though since hospitalization behaviors have seemed to be a little easier to redirect. [name] talked about turning his tags in on his truck and not driving. [name] talked about the challenge of having to be a passenger while his wife drives. 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 [name] endorses that it is hard to sit as a passenger when you are so used to be in the driver. We talked about [name]'s functional level where he does require a little more assistance with adls including bathing, dressing. [name] does dress himself though bathing he does require some assistance as his legs become weak when standing for some time. [name] endorses she tried to get him to be agreeable to a shower chair but he declined. We talked about [name]'s appetite. [name] endorses that it is getting a little better now. [name] endorses he is able to cook food like eggs and sandwiches. [name] endorses the other day he forgot to turn the stove off when he was done. We talked about option of checking the stove or unplugging. [name] in  agreement. We talked about going down 4 closed sizes since last year. We talked about tremors that he is developed in his hands. [name] endorses sometimes [name] is not even able to hold a cup and other times he is. [name] endorses she called Dr Manuella Ghazi Neurologist to talk to him about the tremors and it was felt possibly could be related to the liver cirrhosis.Mrs Fridman has a message in for gastroenterology to see what their thoughts are on the tremors. We talked about liver cirrhosis with wishes for more work up to see possible causes behind as there's no history of alcohol.we talked about realistic expectation. We talked about challenges with cognitive changes. Mrs Tucholski endorses Mr Bartunek spends a lot of time in the chair in the living room sleeping. Mrs Becker endorses at times Mr Kasparek does go for walks up the road or in the yard but the neighbors know who he is so there is not been a problem with getting lost. We talked about safety. We talked about the effects of the cognitive change and how someone feels about their life. We talked about how Mr Hockey feels about the overall changes that are happening to him. Mr Westhoff endorses that it is hard but they are working together to get through it. We talked about medical goals of care. Mrs Whang talked about home health depending as they still have not started services yet. Mrs Fennimore talked about the insurance. We talked about role of palliative care in plan of care. We talked about follow up palliative care visit in four weeks if needed or sooner  should he decline. Mr and Mrs Deman in agreement, appointment scheduled. Therapeutic listening, emotional support provided. Discussed with Mr and Mrs Clingerman will explore Mr Bolender's feelings more at next visit about chronic disease progression. Questions answered to satisfaction.  History obtained from review of EMR, discussion wiith interview with wife, and  Mr. Liby. I reviewed available labs, medications, imaging, studies and related documents from the EMR.  Records reviewed and summarized above.   ROS Full 14 system review of systems performed and negative with exception of: as per HPI.  Physical Exam: Constitutional: NAD General: pleasant male EYES:  lids intact ENMT: oral mucous membranes moist CV: S1S2, RRR, +Bilateral LE edema Pulmonary: LCTA, no increased work of breathing, no cough, room air Abdomen:normo-active BS + 4 quadrants, soft and non tender GU: deferred MSK: moves all extremities, ambulatory Skin: warm and dry, no rashes or wounds on visible skin Neuro:  + generalized weakness,  + cognitive impairment Psych: smiling, interactive, A and O x 3 Hem/lymph/immuno: no widespread bruising  CURRENT PROBLEM LIST:  Patient Active Problem List   Diagnosis Date Noted  . Acute pain of left knee   . Impaired fasting glucose   . Epididymitis, right   . Acute cystitis 03/24/2020  . Fever   . Abdominal pain, suprapubic   . Other cirrhosis of liver ([place])   . Weakness   . Elevated INR   . UTI (urinary tract infection) 03/23/2020  . Dehydration   . Dementia without behavioral disturbance ([place])   . Type II diabetes mellitus with renal manifestations ([place]) 02/19/2020  . Acute metabolic encephalopathy 67/34/1937  . Nausea & vomiting 02/19/2020  . AKI (acute kidney injury) ([place]) 02/19/2020  . Chronic diastolic CHF (congestive heart failure) ([place]) 02/19/2020  . Pressure injury of skin 02/19/2020  . Agitation 02/15/2020  . Bradycardia 12/23/2019  . Acute CHF (congestive heart failure) ([place]) 12/23/2019  . Thrombocytopenia ([place]) 07/18/2016  . Benign prostatic hyperplasia without lower urinary tract symptoms 07/16/2016  . History of melanoma 12/06/2015  . Diabetes mellitus type 2, uncomplicated ([place]) 90/24/0973  . 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 tophus 12/15/2013  . Essential hypertension 12/15/2013  . Hypercholesterolemia 12/15/2013    PAST MEDICAL HISTORY:  Active Ambulatory Problems    Diagnosis Date Noted  . Benign prostatic hyperplasia without lower urinary tract symptoms 07/16/2016  . 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 tophus 12/15/2013  . Diabetes mellitus type 2, uncomplicated ([place]) 53/29/9242  . Essential hypertension 12/15/2013  . History of melanoma 12/06/2015  . Hypercholesterolemia 12/15/2013  . Thrombocytopenia ([employer]) 07/18/2016  . Bradycardia 12/23/2019  . Acute CHF (congestive heart failure) ([place]) 12/23/2019  . Agitation 02/15/2020  . Type II diabetes mellitus with renal manifestations ([place]) 02/19/2020  . Acute metabolic encephalopathy 68/34/1962  . Nausea & vomiting 02/19/2020  . AKI (acute kidney injury) ([place]) 02/19/2020  . Chronic diastolic CHF (congestive heart failure) ([place]) 02/19/2020  . Pressure injury of skin 02/19/2020  . Dehydration   . Dementia without behavioral disturbance ([place])   . UTI (urinary tract infection) 03/23/2020  . Acute cystitis 03/24/2020  . Fever   . Abdominal pain, suprapubic   . Other cirrhosis of liver ([place])   . Weakness   . Elevated INR   . Epididymitis, right   . Acute pain of left knee   . Impaired fasting glucose    Resolved Ambulatory Problems    Diagnosis Date Noted  . No Resolved Ambulatory Problems   Past Medical History:  Diagnosis Date  . BPH (benign prostatic hyperplasia)   .  Diabetes mellitus without complication ([place])   . Gout   . Hypercholesteremia   . Hypertension   . Skin cancer 2017   SOCIAL HX:  Social History   Tobacco Use  . Smoking status: Never Smoker  . Smokeless tobacco: Never Used  Substance Use Topics  . Alcohol use: No   FAMILY HX:  Family History  Problem Relation Age of Onset  . Coronary artery disease Mother   . Polycythemia Mother   . Macular degeneration Mother   . Dementia Mother   . Coronary artery disease Father     Reviewed  ALLERGIES: No Known Allergies   PERTINENT MEDICATIONS:  Outpatient Encounter Medications  as of 05/10/2020  Medication Sig  . aspirin EC 81 MG tablet Take 81 mg by mouth daily.   . Cholecalciferol (VITAMIN D3) 50 MCG (2000 UT) capsule Take 2,000 Units by mouth 2 (two) times daily.  . colchicine 0.6 MG tablet One tab po Daily for 4 days then as needed for gout after that  . divalproex (DEPAKOTE) 125 MG DR tablet Take 125 mg by mouth at bedtime. (Patient not taking: No sig reported)  . docusate sodium (COLACE) 100 MG capsule Take 1 capsule (100 mg total) by mouth 2 (two) times daily as needed for mild constipation.  . dutasteride (AVODART) 0.5 MG capsule Take 0.5 mg by mouth every morning.  . febuxostat (ULORIC) 40 MG tablet Take 40 mg by mouth daily.   . feeding supplement (ENSURE ENLIVE / ENSURE PLUS) LIQD Take 237 mLs by mouth 3 (three) times daily between meals.  . Omega-3 Fatty Acids (FISH OIL) 1000 MG CAPS Take 1,000 mg by mouth daily.  . traZODone (DESYREL) 50 MG tablet Take 50 mg by mouth at bedtime.   No facility-administered encounter medications on file as of 05/10/2020.   Questions and concerns were addressed. The patient/family was encouraged to call with questions and/or concerns. My business card was provided. Provided general support and encouragement, no other unmet needs identified  Thank you for the opportunity to participate in the care of Mr. Mcanany.  The palliative care team will continue to follow. Please call our office at [phone] if we can be of additional assistance.   This chart was dictated using voice recognition software. Despite best efforts to proofread, errors can occur which can change the documentation meaning.   Lyllian Gause Z Aerielle Stoklosa, NP , MSN, ACHPN  COVID-19 PATIENT SCREENING TOOL Asked and negative response unless otherwise noted:   Have you had symptoms of covid, tested positive or been in contact with someone with symptoms/positive test in the past 5-10 days?  NO

## 2020-05-12 ENCOUNTER — Telehealth: Payer: Self-pay

## 2020-05-12 NOTE — Telephone Encounter (Signed)
05/12/20 @11AM  : Palliative care SW outreached patients wife, Adonis Huguenin, per NPMidge Aver, request as wife requested SW outreach.  Call unsuccessful. SW left HIPPA compliant VM. Awaiting return call.

## 2020-05-16 DIAGNOSIS — L720 Epidermal cyst: Secondary | ICD-10-CM | POA: Diagnosis not present

## 2020-05-18 ENCOUNTER — Telehealth: Payer: Self-pay | Admitting: Oncology

## 2020-05-26 DIAGNOSIS — K769 Liver disease, unspecified: Secondary | ICD-10-CM | POA: Diagnosis not present

## 2020-05-26 DIAGNOSIS — Z86010 Personal history of colonic polyps: Secondary | ICD-10-CM | POA: Diagnosis not present

## 2020-05-26 DIAGNOSIS — K7460 Unspecified cirrhosis of liver: Secondary | ICD-10-CM | POA: Diagnosis not present

## 2020-06-16 DIAGNOSIS — F0150 Vascular dementia without behavioral disturbance: Secondary | ICD-10-CM | POA: Diagnosis not present

## 2020-06-16 DIAGNOSIS — F0280 Dementia in other diseases classified elsewhere without behavioral disturbance: Secondary | ICD-10-CM | POA: Diagnosis not present

## 2020-06-16 DIAGNOSIS — F3289 Other specified depressive episodes: Secondary | ICD-10-CM | POA: Diagnosis not present

## 2020-06-16 DIAGNOSIS — G309 Alzheimer's disease, unspecified: Secondary | ICD-10-CM | POA: Diagnosis not present

## 2020-06-16 DIAGNOSIS — M545 Low back pain, unspecified: Secondary | ICD-10-CM | POA: Diagnosis not present

## 2020-06-20 ENCOUNTER — Inpatient Hospital Stay: Payer: PPO | Attending: Oncology

## 2020-06-20 ENCOUNTER — Encounter: Payer: Self-pay | Admitting: *Deleted

## 2020-06-20 ENCOUNTER — Other Ambulatory Visit: Payer: Self-pay

## 2020-06-20 ENCOUNTER — Inpatient Hospital Stay (HOSPITAL_BASED_OUTPATIENT_CLINIC_OR_DEPARTMENT_OTHER): Payer: PPO | Admitting: Oncology

## 2020-06-20 ENCOUNTER — Encounter: Payer: Self-pay | Admitting: Oncology

## 2020-06-20 ENCOUNTER — Other Ambulatory Visit: Payer: Self-pay | Admitting: *Deleted

## 2020-06-20 VITALS — BP 134/74 | HR 77 | Temp 99.4°F | Resp 16 | Ht 70.0 in | Wt 198.8 lb

## 2020-06-20 DIAGNOSIS — D649 Anemia, unspecified: Secondary | ICD-10-CM | POA: Insufficient documentation

## 2020-06-20 DIAGNOSIS — D696 Thrombocytopenia, unspecified: Secondary | ICD-10-CM | POA: Diagnosis not present

## 2020-06-20 DIAGNOSIS — D6959 Other secondary thrombocytopenia: Secondary | ICD-10-CM | POA: Insufficient documentation

## 2020-06-20 DIAGNOSIS — K7460 Unspecified cirrhosis of liver: Secondary | ICD-10-CM | POA: Diagnosis not present

## 2020-06-20 DIAGNOSIS — D61818 Other pancytopenia: Secondary | ICD-10-CM

## 2020-06-20 DIAGNOSIS — D638 Anemia in other chronic diseases classified elsewhere: Secondary | ICD-10-CM

## 2020-06-20 LAB — COMPREHENSIVE METABOLIC PANEL
ALT: 20 U/L (ref 0–44)
AST: 29 U/L (ref 15–41)
Albumin: 3.2 g/dL — ABNORMAL LOW (ref 3.5–5.0)
Alkaline Phosphatase: 79 U/L (ref 38–126)
Anion gap: 7 (ref 5–15)
BUN: 22 mg/dL (ref 8–23)
CO2: 28 mmol/L (ref 22–32)
Calcium: 9.1 mg/dL (ref 8.9–10.3)
Chloride: 104 mmol/L (ref 98–111)
Creatinine, Ser: 1.22 mg/dL (ref 0.61–1.24)
GFR, Estimated: >60 mL/min (ref >60)
Glucose, Bld: 137 mg/dL — ABNORMAL HIGH (ref 70–99)
Potassium: 4.3 mmol/L (ref 3.5–5.1)
Sodium: 139 mmol/L (ref 135–145)
Total Bilirubin: 1.6 mg/dL — ABNORMAL HIGH (ref 0.3–1.2)
Total Protein: 5.7 g/dL — ABNORMAL LOW (ref 6.5–8.1)

## 2020-06-20 LAB — CBC WITH DIFFERENTIAL/PLATELET
Abs Immature Granulocytes: 0.01 10*3/uL (ref 0.00–0.07)
Basophils Absolute: 0 10*3/uL (ref 0.0–0.1)
Basophils Relative: 0 %
Eosinophils Absolute: 0.1 10*3/uL (ref 0.0–0.5)
Eosinophils Relative: 2 %
HCT: 34.3 % — ABNORMAL LOW (ref 39.0–52.0)
Hemoglobin: 12.7 g/dL — ABNORMAL LOW (ref 13.0–17.0)
Immature Granulocytes: 0 %
Lymphocytes Relative: 31 %
Lymphs Abs: 1.5 10*3/uL (ref 0.7–4.0)
MCH: 35.9 pg — ABNORMAL HIGH (ref 26.0–34.0)
MCHC: 37 g/dL — ABNORMAL HIGH (ref 30.0–36.0)
MCV: 96.9 fL (ref 80.0–100.0)
Monocytes Absolute: 0.4 10*3/uL (ref 0.1–1.0)
Monocytes Relative: 8 %
Neutro Abs: 2.8 10*3/uL (ref 1.7–7.7)
Neutrophils Relative %: 59 %
Platelets: 75 10*3/uL — ABNORMAL LOW (ref 150–400)
RBC: 3.54 MIL/uL — ABNORMAL LOW (ref 4.22–5.81)
RDW: 13.2 % (ref 11.5–15.5)
WBC: 4.7 10*3/uL (ref 4.0–10.5)
nRBC: 0 % (ref 0.0–0.2)

## 2020-06-20 NOTE — Progress Notes (Signed)
Recent UTI and was in the hosp. All labs of cbc was red flagged per wife

## 2020-06-21 ENCOUNTER — Encounter: Payer: Self-pay | Admitting: Nurse Practitioner

## 2020-06-21 ENCOUNTER — Other Ambulatory Visit: Payer: PPO | Admitting: Nurse Practitioner

## 2020-06-21 DIAGNOSIS — R5381 Other malaise: Secondary | ICD-10-CM | POA: Diagnosis not present

## 2020-06-21 DIAGNOSIS — Z515 Encounter for palliative care: Secondary | ICD-10-CM | POA: Diagnosis not present

## 2020-06-21 LAB — ERYTHROPOIETIN: Erythropoietin: 22.8 m[IU]/mL — ABNORMAL HIGH (ref 2.6–18.5)

## 2020-06-21 NOTE — Progress Notes (Signed)
Hematology/Oncology Consult note [facility]  [phone] [phone]  Patient Care Team: Toni Arthurs, NP as PCP - General (Family Medicine)   Name of the patient: Dean Lynch  [id number]  04-26-1950   Date of visit: 06/21/20  Diagnosis- 1. Anemia possible due to chronic disease 2. Thrombocytopenia secondary to liver cirrhosis  Chief complaint/ Reason for visit-routine follow-up of anemia and thrombocytopenia   Heme/Onc history: patient is a 70 year old male with a past medical history significant for hypertension, hyperlipidemia and type 2 diabetes and other medical problems. He has been referred to [place] for evaluation and management of thrombocytopenia. Recent CBC from 07/16/2016 showed white count of 7.1, H&H of 9/38.2 with an MCV of 97 and a platelet count of 110. On review of his prior CBCs since 2015 his platelet counts have always been between [phone]'s. Recent CMP was within normal limits except for a mildly elevated total bilirubin of 1.5. HIV and hepatitis C testing was negative. TSH was within normal limits.Patient has baseline cirrhosis of unclear etiology and sees [facility]     Extensive anemia work-up in April 2021 Showed normal B12, folate, iron studies although iron saturation was elevated at 66%.  TSH was normal.  Myeloma panel was unremarkable.  Serum free light chain ratio was normal.  Reticulocyte count was 2.3%.  CBC showed white count of 5.2, hemoglobin of 11.8 and a platelet count of 97.  Hematocrit and MCV has not been reported due to presence of interfering substance although historically patient has had longstanding macrocytosis.   Interval history-patient is here with his wife today.  He was admitted to the hospital in March 2022 for symptoms of UTI and pseudogout.  He reports baseline fatigue and has some ongoing cognitive decline.  Denies other complaints  ECOG PS- 1-2 Pain scale- 0   Review of systems- Review  of Systems  Constitutional:  Positive for malaise/fatigue.     No Known Allergies   Past Medical History:  Diagnosis Date   BPH (benign prostatic hyperplasia)    Cirrhosis of liver (HCC)    Diabetes mellitus without complication ([place])    Gout    Hypercholesteremia    Hypertension    Skin cancer 2017   melanoa- [facility]   Thrombocytopenia [facility])      Past Surgical History:  Procedure Laterality Date   CATARACT EXTRACTION W/PHACO Right 09/27/2015   Procedure: CATARACT EXTRACTION PHACO AND INTRAOCULAR LENS PLACEMENT ([place]);  Surgeon: Leandrew Koyanagi, MD;  Location: [facility];  Service: Ophthalmology;  Laterality: Right;  DIABETIC   KNEE ARTHROSCOPY     PACEMAKER IMPLANT N/A 12/24/2019   Procedure: PACEMAKER IMPLANT;  Surgeon: Marzetta Board, MD;  Location: [facility];  Service: Cardiovascular;  Laterality: N/A;    Social History   Socioeconomic History   Marital status: Married    Spouse name: Not on file   Number of children: Not on file   Years of education: Not on file   Highest education level: Not on file  Occupational History   Not on file  Tobacco Use   Smoking status: Never   Smokeless tobacco: Never  Vaping Use   Vaping Use: Never used  Substance and Sexual Activity   Alcohol use: No   Drug use: No   Sexual activity: Yes  Other Topics Concern   Not on file  Social History Narrative   Not on file   Social Determinants of Health  Financial Resource Strain: Not on file  Food Insecurity: Not on file  Transportation Needs: Not on file  Physical Activity: Not on file  Stress: Not on file  Social Connections: Not on file  Intimate Partner Violence: Not on file    Family History  Problem Relation Age of Onset   Coronary artery disease Mother    Polycythemia Mother    Macular degeneration Mother    Dementia Mother    Coronary artery disease Father      Current Outpatient Medications:    aspirin EC 81 MG tablet, Take  81 mg by mouth daily. , Disp: , Rfl:    divalproex (DEPAKOTE) 125 MG DR tablet, Take 125 mg by mouth at bedtime., Disp: , Rfl:    dutasteride (AVODART) 0.5 MG capsule, Take 0.5 mg by mouth every morning., Disp: , Rfl:    feeding supplement (ENSURE ENLIVE / ENSURE PLUS) LIQD, Take 237 mLs by mouth 3 (three) times daily between meals., Disp: 21330 mL, Rfl: 12   Omega-3 Fatty Acids (FISH OIL) 1000 MG CAPS, Take 1,000 mg by mouth daily., Disp: , Rfl:    traZODone (DESYREL) 100 MG tablet, Take 100 mg by mouth at bedtime., Disp: , Rfl:   Physical exam:  Vitals:   06/20/20 1333  BP: 134/74  Pulse: 77  Resp: 16  Temp: 99.4 F (37.4 C)  TempSrc: Tympanic  Weight: 198 lb 12.8 oz (90.2 kg)  Height: '5\' 10"'  (1.778 m)   Physical Exam Constitutional:      Comments: Ambulates with a cane.  Appears somewhat frail  Cardiovascular:     Rate and Rhythm: Normal rate and regular rhythm.     Heart sounds: Normal heart sounds.  Pulmonary:     Effort: Pulmonary effort is normal.     Breath sounds: Normal breath sounds.  Skin:    General: Skin is warm and dry.  Neurological:     Mental Status: He is alert and oriented to person, place, and time.     CMP Latest Ref Rng & Units 06/20/2020  Glucose 70 - 99 mg/dL 137(H)  BUN 8 - 23 mg/dL 22  Creatinine 0.61 - 1.24 mg/dL 1.22  Sodium 135 - 145 mmol/L 139  Potassium 3.5 - 5.1 mmol/L 4.3  Chloride 98 - 111 mmol/L 104  CO2 22 - 32 mmol/L 28  Calcium 8.9 - 10.3 mg/dL 9.1  Total Protein 6.5 - 8.1 g/dL 5.7(L)  Total Bilirubin 0.3 - 1.2 mg/dL 1.6(H)  Alkaline Phos 38 - 126 U/L 79  AST 15 - 41 U/L 29  ALT 0 - 44 U/L 20   CBC Latest Ref Rng & Units 06/20/2020  WBC 4.0 - 10.5 K/uL 4.7  Hemoglobin 13.0 - 17.0 g/dL 12.7(L)  Hematocrit 39.0 - 52.0 % 34.3(L)  Platelets 150 - 400 K/uL 75(L)      Assessment and plan- Patient is a 70 y.o. male who is here for follow-up of following issues:  Normocytic anemia: He has had a component of chronic kidney  disease in the past but today and his kidney numbers are within normal limits.  Also his hemoglobin is significantly Improved from 8.7 in March 20 22-12.7 today without any additional intervention.  At this time he does not require a bone marrow biopsy.  Repeat CBC ferritin iron studies B12 and folate in 3 months in 6 months and he will see covering NP in 6 months.  2.  Thrombocytopenia: Likely secondary to liver cirrhosis.  His platelet counts  were more in the 100s in 2018 and gradually drifted to 70s presently.  Continue to monitor  3.  Liver cirrhosis: He will need to follow-upWith [facility] for this.  Patient's wife states that he does have an upcoming EGD and a colonoscopy as well   Visit Diagnosis 1. Anemia of chronic disease   2. Thrombocytopenia ([place])      Dr. Randa Evens, MD, MPH [facility] [phone] 06/21/2020 8:21 AM

## 2020-06-21 NOTE — Progress Notes (Signed)
[facility] Consult Note Telephone: [phone]  Fax: [phone]    Date of encounter: 06/21/20 PATIENT NAME: Dean Lynch [place] [id number]   [phone] (home)  DOB: 1950/03/28 MRN: [id number] PRIMARY CARE PROVIDER:    Toni Arthurs, NP,  [place] [id number] [phone]  RESPONSIBLE PARTY:    Contact Information     Name Relation Home Work [address]   Dean Lynch, Dean Lynch [phone]  [phone]      I met face to face with patient and wife in home. Palliative Care was asked to follow this patient by consultation request of  Toni Arthurs, NP to address advance care planning and complex medical decision making. This is a follow up visit. ASSESSMENT AND PLAN / RECOMMENDATIONS:   Symptom Management/Plan: 1. ACP: DNR in place; in vynca; continue treat what is treatable   2. Debility secondary to dementia; encourage mobility, self independence as able   3. Palliative care encounter; Palliative care encounter; Palliative medicine team will continue to support patient, patient's family, and medical team. Visit consisted of counseling and education dealing with the complex and emotionally intense issues of symptom management and palliative care in the setting of serious and potentially life-threatening illness  Follow up Palliative Care Visit: Palliative care will continue to follow for complex medical decision making, advance care planning, and clarification of goals. Return 12 weeks or prn.  I spent 45 minutes providing this consultation. More than 50% of the time in this consultation was spent in counseling and care coordination.  PPS: 50%  HOSPICE ELIGIBILITY/DIAGNOSIS: TBD  Chief Complaint: Follow up Palliative consult for complex medical decision making  HISTORY OF PRESENT ILLNESS:  Dean Lynch is a 70 y.o. year old male  with multiple medical problems  including  Dementia, Chronic diastolic congestive heart failure, cirrhosis of liver, Diabetes, hypertension, hypercholesterolemia, BPH, gout, thrombocytopenia, Melanoma removed 2017, pacemaker implant, right cataract extraction with [facility]. Hospitalization 03/23/2020 to 03/27/2020 for acute cystitis with lower abdominal pain and he materia right epididymis growing E coli and Proteus giving antibiotics. Acute left knee pain likely pseudogout. New diagnosis of cirrhosis with chronic thrombocytopenia. Acute kidney injury creatinine 1.4 on presentation trended down to 1.11. Stage 1 deubitus. DNR in vynca. Seen by Dr Manuella Ghazi Neurology 04/03/2020 for Dementia likely multifactorial with components of frontal temporal dementia versus lewy bodies with hallucinations agitation MRI showingmild white matter microvascular ischaemia and metabolic changes and parenchymal atrophy. Depakote prescribed for behaviors. Home health orders for PT, OT, speech, respite care, social worker. Seen by Gastrology 04/20/2020 for new diagnosis liver cirrhosis/dilated proximal CBD with plan for Assess further autoimmune, hereditary, acquired, issues. Pacemaker which seemed to preclude MCRP- transaminsesn alp normal, mild elevated total bilirubin. Assess triphasic CT no abdominal pain. GFR with chronic kidney disease improved recently. History of : polyps next visit they will discuss colonoscopy. I called Mrs Remo to confirm palliative care initial visit and COVID screening which was negative. I visited Mrs and Mr Till in their home. Mr Notz was ambulatory and does use a cane when he is walking outside. We talked about ADL's. Mr. Rho is able to feed himself with good appetite. We talked about challenges with sleep. Mrs. Bodi endorses Mr .Mitcheltree does fall asleep in the recliner in the living room, then gets up and comes to bed. Mrs Struble endorses he sleeps a few hours then get back up to his recliner. We talked about his  sleep  pattern, sleep hygiene. 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 We talked about option of melatonin although Mrs. Salvi endorses he has tried that in the past without improvement. We talked about the progression of dementia, realistic expectations. We talked about pace program, information provided. Mrs Kopke endorses she will look more into the program. We talked about Mr. Needle daily routine, quality of life in the setting of dementia. Mr. Aaron Mose was able to answer questions, at times he looked to Mrs. Tissue to answer. We talked about behaviors, Mrs. Cosma endorses is can be challenging as she does try to redirect Mr. Waggle. We talked about medical goals of care. We talked about role PC in POC. We talked about f/u PC visit, scheduled. Mrs. Livers verbalized understanding she can call if problems arise prior to [facility] f/u visit. Therapeutic listening, emotional support provided. Questions answered to satisfaction.  History obtained from review of EMR, discussion with Mrs and  Mr. Sheller.  I reviewed available labs, medications, imaging, studies and related documents from the EMR.  Records reviewed and summarized above.   ROS Full 14 system review of systems performed and negative with exception of: as per HPI.   Physical Exam: Constitutional: NAD General: pleasant male EYES: lids intact ENMT: intact hearing, oral mucous membranes moist, dentition intact CV: S1S2, RRR Pulmonary: LCTA, no increased work of breathing, no cough, room air Abdomen: intake 100%, soft and non tender, MSK: ambulatory Skin: warm and dry, no rashes or wounds on visible skin Neuro:  no generalized weakness,  mild  cognitive impairment Psych: non-anxious affect, A and O x 3  Questions and concerns were addressed. The patient/family was encouraged to call with questions and/or concerns. My business card was provided. Provided general support and encouragement, no  other unmet needs identified   Thank you for the opportunity to participate in the care of Mr. Renovato.  The palliative care team will continue to follow. Please call our office at [phone] if we can be of additional assistance.   This chart was dictated using voice recognition software.  Despite best efforts to proofread,  errors can occur which can change the documentation meaning.   Marybeth Dandy Z Massiah Minjares, NP , MSN,  ACHPN  COVID-19 PATIENT SCREENING TOOL Asked and negative response unless otherwise noted:   Have you had symptoms of covid, tested positive or been in contact with someone with symptoms/positive test in the past 5-10 days? NO

## 2020-06-22 NOTE — Progress Notes (Signed)
06/22/20 @3pm : palliative care SW mailed caregiver resources to include information on PACE, per [facility] NP - C. Gusler request.   Information mailed to address on file.

## 2020-07-13 DIAGNOSIS — F4321 Adjustment disorder with depressed mood: Secondary | ICD-10-CM | POA: Diagnosis not present

## 2020-07-13 DIAGNOSIS — G300 Alzheimer's disease with early onset: Secondary | ICD-10-CM | POA: Diagnosis not present

## 2020-07-13 DIAGNOSIS — R531 Weakness: Secondary | ICD-10-CM | POA: Diagnosis not present

## 2020-07-13 DIAGNOSIS — F0280 Dementia in other diseases classified elsewhere without behavioral disturbance: Secondary | ICD-10-CM | POA: Diagnosis not present

## 2020-07-13 DIAGNOSIS — F0390 Unspecified dementia without behavioral disturbance: Secondary | ICD-10-CM | POA: Diagnosis not present

## 2020-07-13 DIAGNOSIS — R258 Other abnormal involuntary movements: Secondary | ICD-10-CM | POA: Diagnosis not present

## 2020-08-21 ENCOUNTER — Telehealth: Payer: Self-pay | Admitting: Nurse Practitioner

## 2020-08-21 NOTE — Telephone Encounter (Signed)
Ms. Dean Lynch contacted Dean Kidney RN with [facility] with concerns, wanted to communicate with [facility] NP. Dean Kidney RN endorses Ms. Dean Lynch contacted about multiple falls this past weekend one requiring EMS to assist as Ms. Dean Lynch was unable to get off the floor. Ms. Dean Lynch shared decline, as he is having difficulty ambulating, skill level has increased. Discussed with Dean Kidney RN will contact PC SW/RN to f/u with call and visit as this NP schedule is full for further evaluation and possible decline which he is able to meet hospice eligibility.   Fermin Schwab RN [id number] Total time spent 20 minutes Documentation 5 minutes Phone discussion 15 minutes

## 2020-08-28 ENCOUNTER — Telehealth: Payer: Self-pay

## 2020-08-28 NOTE — Telephone Encounter (Signed)
08/21/20 @ 2:40 PM: Palliative care SW outreached patient spouse per PC NP concern/request.   Spouse shared that patient had declined physically since PC last visit. Patient recently had a fall in a department store (last Sunday), had a fall this past Friday and could not get off of the commode yesterday. Wife shared that she has had to call EMS to assist with each episode. Wife states that patient is slower, but mobile today and was able to ambulate to the bathroom and back to the bedroom.   [facility] is scheduled to do an initial assessment on 8/19. PC visit offered  for this week as well, to spouse to assess patient. Spouse declined PC visit and would like to see how HH assessments goes first.Spouse in agreement for SW to outreach beginning of next to follow up.   08/28/20 '@11'$ :40 AM: FOLLOW UP CALL:  Call unsuccessful. SW LVM. Awaiting return call.

## 2020-08-28 NOTE — Telephone Encounter (Signed)
08/28/20 '@11'$ :72 AM: INCOMING CALL:  Patients spouse returned SW TC. Spouse shared that [facility] rescheduled initial assessment was rescheduled from 8/19 to 8/23. Spouse shares that patient is the same since previous conversation. Continues to require physical assistance with care and transfers. No falls Appetite is good. Spouse shares that patients sleeps majority if the day. Patient continues to be weak. Spouse shares that she acknowledges disease progression and feels she is coping with it well, although it can be demanding on her. Spouse shares that she does not feel new medication prescribed for tremors is effective.  Spouse declined PC visit this week. Would like to keep PC NP visit for 9/1. Spouse is aware to outreach PC if needed prior.

## 2020-08-29 DIAGNOSIS — E1122 Type 2 diabetes mellitus with diabetic chronic kidney disease: Secondary | ICD-10-CM | POA: Diagnosis not present

## 2020-08-29 DIAGNOSIS — Z950 Presence of cardiac pacemaker: Secondary | ICD-10-CM | POA: Diagnosis not present

## 2020-08-29 DIAGNOSIS — F4321 Adjustment disorder with depressed mood: Secondary | ICD-10-CM | POA: Diagnosis not present

## 2020-08-29 DIAGNOSIS — N183 Chronic kidney disease, stage 3 unspecified: Secondary | ICD-10-CM | POA: Diagnosis not present

## 2020-08-29 DIAGNOSIS — M1A9XX0 Chronic gout, unspecified, without tophus (tophi): Secondary | ICD-10-CM | POA: Diagnosis not present

## 2020-08-29 DIAGNOSIS — E559 Vitamin D deficiency, unspecified: Secondary | ICD-10-CM | POA: Diagnosis not present

## 2020-08-29 DIAGNOSIS — I130 Hypertensive heart and chronic kidney disease with heart failure and stage 1 through stage 4 chronic kidney disease, or unspecified chronic kidney disease: Secondary | ICD-10-CM | POA: Diagnosis not present

## 2020-08-29 DIAGNOSIS — G300 Alzheimer's disease with early onset: Secondary | ICD-10-CM | POA: Diagnosis not present

## 2020-08-29 DIAGNOSIS — I441 Atrioventricular block, second degree: Secondary | ICD-10-CM | POA: Diagnosis not present

## 2020-08-29 DIAGNOSIS — G4700 Insomnia, unspecified: Secondary | ICD-10-CM | POA: Diagnosis not present

## 2020-08-29 DIAGNOSIS — N39498 Other specified urinary incontinence: Secondary | ICD-10-CM | POA: Diagnosis not present

## 2020-08-29 DIAGNOSIS — N401 Enlarged prostate with lower urinary tract symptoms: Secondary | ICD-10-CM | POA: Diagnosis not present

## 2020-08-29 DIAGNOSIS — K5790 Diverticulosis of intestine, part unspecified, without perforation or abscess without bleeding: Secondary | ICD-10-CM | POA: Diagnosis not present

## 2020-08-29 DIAGNOSIS — Z7982 Long term (current) use of aspirin: Secondary | ICD-10-CM | POA: Diagnosis not present

## 2020-08-29 DIAGNOSIS — K7460 Unspecified cirrhosis of liver: Secondary | ICD-10-CM | POA: Diagnosis not present

## 2020-08-29 DIAGNOSIS — Z9181 History of falling: Secondary | ICD-10-CM | POA: Diagnosis not present

## 2020-08-29 DIAGNOSIS — N528 Other male erectile dysfunction: Secondary | ICD-10-CM | POA: Diagnosis not present

## 2020-08-29 DIAGNOSIS — F0280 Dementia in other diseases classified elsewhere without behavioral disturbance: Secondary | ICD-10-CM | POA: Diagnosis not present

## 2020-08-29 DIAGNOSIS — Z86010 Personal history of colonic polyps: Secondary | ICD-10-CM | POA: Diagnosis not present

## 2020-08-29 DIAGNOSIS — H9313 Tinnitus, bilateral: Secondary | ICD-10-CM | POA: Diagnosis not present

## 2020-08-29 DIAGNOSIS — E7800 Pure hypercholesterolemia, unspecified: Secondary | ICD-10-CM | POA: Diagnosis not present

## 2020-08-29 DIAGNOSIS — I509 Heart failure, unspecified: Secondary | ICD-10-CM | POA: Diagnosis not present

## 2020-08-29 DIAGNOSIS — E785 Hyperlipidemia, unspecified: Secondary | ICD-10-CM | POA: Diagnosis not present

## 2020-09-01 ENCOUNTER — Ambulatory Visit: Admit: 2020-09-01 | Payer: PPO

## 2020-09-01 SURGERY — COLONOSCOPY WITH PROPOFOL
Anesthesia: General

## 2020-09-07 ENCOUNTER — Other Ambulatory Visit: Payer: PPO | Admitting: Nurse Practitioner

## 2020-09-07 ENCOUNTER — Other Ambulatory Visit: Payer: Self-pay

## 2020-09-07 ENCOUNTER — Telehealth: Payer: Self-pay

## 2020-09-07 ENCOUNTER — Encounter: Payer: Self-pay | Admitting: Nurse Practitioner

## 2020-09-07 DIAGNOSIS — K7460 Unspecified cirrhosis of liver: Secondary | ICD-10-CM | POA: Diagnosis not present

## 2020-09-07 DIAGNOSIS — I130 Hypertensive heart and chronic kidney disease with heart failure and stage 1 through stage 4 chronic kidney disease, or unspecified chronic kidney disease: Secondary | ICD-10-CM | POA: Diagnosis not present

## 2020-09-07 DIAGNOSIS — G300 Alzheimer's disease with early onset: Secondary | ICD-10-CM | POA: Diagnosis not present

## 2020-09-07 DIAGNOSIS — K5790 Diverticulosis of intestine, part unspecified, without perforation or abscess without bleeding: Secondary | ICD-10-CM | POA: Diagnosis not present

## 2020-09-07 DIAGNOSIS — N183 Chronic kidney disease, stage 3 unspecified: Secondary | ICD-10-CM | POA: Diagnosis not present

## 2020-09-07 DIAGNOSIS — I441 Atrioventricular block, second degree: Secondary | ICD-10-CM | POA: Diagnosis not present

## 2020-09-07 DIAGNOSIS — N528 Other male erectile dysfunction: Secondary | ICD-10-CM | POA: Diagnosis not present

## 2020-09-07 DIAGNOSIS — Z9181 History of falling: Secondary | ICD-10-CM | POA: Diagnosis not present

## 2020-09-07 DIAGNOSIS — R5381 Other malaise: Secondary | ICD-10-CM

## 2020-09-07 DIAGNOSIS — E7800 Pure hypercholesterolemia, unspecified: Secondary | ICD-10-CM | POA: Diagnosis not present

## 2020-09-07 DIAGNOSIS — G309 Alzheimer's disease, unspecified: Secondary | ICD-10-CM | POA: Diagnosis not present

## 2020-09-07 DIAGNOSIS — F0280 Dementia in other diseases classified elsewhere without behavioral disturbance: Secondary | ICD-10-CM

## 2020-09-07 DIAGNOSIS — E559 Vitamin D deficiency, unspecified: Secondary | ICD-10-CM | POA: Diagnosis not present

## 2020-09-07 DIAGNOSIS — N39498 Other specified urinary incontinence: Secondary | ICD-10-CM | POA: Diagnosis not present

## 2020-09-07 DIAGNOSIS — Z86010 Personal history of colonic polyps: Secondary | ICD-10-CM | POA: Diagnosis not present

## 2020-09-07 DIAGNOSIS — Z515 Encounter for palliative care: Secondary | ICD-10-CM | POA: Diagnosis not present

## 2020-09-07 DIAGNOSIS — E1122 Type 2 diabetes mellitus with diabetic chronic kidney disease: Secondary | ICD-10-CM | POA: Diagnosis not present

## 2020-09-07 DIAGNOSIS — Z950 Presence of cardiac pacemaker: Secondary | ICD-10-CM | POA: Diagnosis not present

## 2020-09-07 DIAGNOSIS — E785 Hyperlipidemia, unspecified: Secondary | ICD-10-CM | POA: Diagnosis not present

## 2020-09-07 DIAGNOSIS — M1A9XX0 Chronic gout, unspecified, without tophus (tophi): Secondary | ICD-10-CM | POA: Diagnosis not present

## 2020-09-07 DIAGNOSIS — I509 Heart failure, unspecified: Secondary | ICD-10-CM | POA: Diagnosis not present

## 2020-09-07 DIAGNOSIS — F4321 Adjustment disorder with depressed mood: Secondary | ICD-10-CM | POA: Diagnosis not present

## 2020-09-07 DIAGNOSIS — G4700 Insomnia, unspecified: Secondary | ICD-10-CM | POA: Diagnosis not present

## 2020-09-07 DIAGNOSIS — Z7982 Long term (current) use of aspirin: Secondary | ICD-10-CM | POA: Diagnosis not present

## 2020-09-07 DIAGNOSIS — N401 Enlarged prostate with lower urinary tract symptoms: Secondary | ICD-10-CM | POA: Diagnosis not present

## 2020-09-07 DIAGNOSIS — H9313 Tinnitus, bilateral: Secondary | ICD-10-CM | POA: Diagnosis not present

## 2020-09-07 NOTE — Telephone Encounter (Signed)
09/07/20 '@2'$ :15 pm: Palliative care SW completed PACE referral for patient for evaluation and talk about their services.

## 2020-09-07 NOTE — Progress Notes (Signed)
[facility] Consult Note Telephone: [phone]  Fax: [phone]    Date of encounter: 09/07/20 4:41 PM PATIENT NAME: Dean Lynch [id number]   [phone] (home)  DOB: 15-Dec-1950 MRN: [id number] PRIMARY CARE PROVIDER:    Toni Arthurs, NP,  [place] [id number] [phone]  RESPONSIBLE PARTY:    Contact Information     Name Relation Home Work [address]   OBBIE, LEWALLEN [phone]  [phone]      I met face to face with patient and family in home. Palliative Care was asked to follow this patient by consultation request of  Toni Arthurs, NP to address advance care planning and complex medical decision making. This is a follow up visit.                                  ASSESSMENT AND PLAN / RECOMMENDATIONS: Symptom Management/Plan: 1. ACP: DNR in place; in vynca; continue treat what is treatable   2. Debility worsening secondary to Alzheimer's dementia progressive; encourage mobility, self independence as able, fall risk; discussed pace program at length, Mrs Littlepage in agreement to have referral completed for pace to come discuss program, evaluation to explore options.     3. Palliative care encounter; Palliative care encounter; Palliative medicine team will continue to support patient, patient's family, and medical team. Visit consisted of counseling and education dealing with the complex and emotionally intense issues of symptom management and palliative care in the setting of serious and potentially life-threatening illness  Follow up Palliative Care Visit: Palliative care will continue to follow for complex medical decision making, advance care planning, and clarification of goals. Return 4 weeks or prn.  I spent 62 minutes providing this consultation. More than 50% of the time in this consultation was spent in counseling and care coordination.  PPS:  40%  Chief Complaint: Follow up palliative consult for complex medical decision making  HISTORY OF PRESENT ILLNESS:  [name] is a 70 y.o. year old male  with multiple medical problems including  Dementia, Chronic diastolic congestive heart failure, cirrhosis of liver, Diabetes, hypertension, hypercholesterolemia, BPH, gout, thrombocytopenia, Melanoma removed 2017, pacemaker implant, right cataract extraction with [facility]. I called Mrs. Cambre to confirm PC f/u visit and covid screening which was negative. Mrs. Rouser in agreement. We talked about how [name] has been doing. We talked about the changes since last PC visit. He has started physical therapy, walking with cane. Mrs Wangerin endorses [name] did walk with the therapist outside today. Mrs. Kartes endorses he has had more falls for which she has had to get ems to come get him up or a neighbor. We talked about chronic disease progression of dementia, discussed dementia, tremors, also cirrhosis. [name] endorses no recent exacerbations. Edema improved. No shortness of breath. We talked about his activity level, requiring assistance for mobility. We talked about [name] poor motivation. We talked about possible a more structured daily program such as pace. Mrs. Spaugh endorses she has the broshure. We talked about getting a pace person to come talk and evaluate Mr. Navarette as well as going to look at the program. Mrs. Faulds in agreement. Will notify PC SW to send referral. We talked about [name] appetite which is good, no noted weight loss. We talked about decrease in social interactions as [name] was a  very social person. Discussed the opportunities at pace program would give him more interactions, a organized schedule. We talked about quality of life, medical goals reviewed. Discussed role pc in poc. Discussed will schedule f/u pc visit when therapy ending to see how [name] is doing,  monitor for falls, appetite. Mrs. Fabio in agreement, appointment scheduled. Therapeutic listening, emotional support provided. Most of PC visit spend discussions with increase in skill level, more falls, functional and cognitive decline, sleeping more with decrease energy and motivation with Mrs. Lem. Questions answered.   History obtained from review of EMR, discussion with Mrs and [name].  I reviewed available labs, medications, imaging, studies and related documents from the EMR.  Records reviewed and summarized above.   ROS Full 14 system review of systems performed and negative with exception of: as per HPI.   Physical Exam: Constitutional: NAD General: pleasant male EYES: lids intact ENMT: oral mucous membranes moist CV: S1S2, RRR, no LE edema Pulmonary: LCTA, no increased work of breathing, no cough, room air Abdomen: intake 100%, soft and non tender MSK: ambulatory with cane Skin: warm and dry Neuro:  + generalized weakness,  +cognitive impairment Psych: non-anxious affect, A and Oriented to self, place  Questions and concerns were addressed. The patient/family was encouraged to call with questions and/or concerns. My contact information was provided. Provided general support and encouragement, no other unmet needs identified   Thank you for the opportunity to participate in the care of [name].  The palliative care team will continue to follow. Please call our office at [phone] if we can be of additional assistance.   This chart was dictated using voice recognition software.  Despite best efforts to proofread,  errors can occur which can change the documentation meaning.   Yohana Bartha Z Hakan Nudelman, NP   COVID-19 PATIENT SCREENING TOOL Asked and negative response unless otherwise noted:   Have you had symptoms of covid, tested positive or been in contact with someone with symptoms/positive test in the past 5-10 days?  NO

## 2020-09-12 DIAGNOSIS — G4700 Insomnia, unspecified: Secondary | ICD-10-CM | POA: Diagnosis not present

## 2020-09-12 DIAGNOSIS — I509 Heart failure, unspecified: Secondary | ICD-10-CM | POA: Diagnosis not present

## 2020-09-12 DIAGNOSIS — N39498 Other specified urinary incontinence: Secondary | ICD-10-CM | POA: Diagnosis not present

## 2020-09-12 DIAGNOSIS — E1122 Type 2 diabetes mellitus with diabetic chronic kidney disease: Secondary | ICD-10-CM | POA: Diagnosis not present

## 2020-09-12 DIAGNOSIS — I441 Atrioventricular block, second degree: Secondary | ICD-10-CM | POA: Diagnosis not present

## 2020-09-12 DIAGNOSIS — F0280 Dementia in other diseases classified elsewhere without behavioral disturbance: Secondary | ICD-10-CM | POA: Diagnosis not present

## 2020-09-12 DIAGNOSIS — Z86010 Personal history of colonic polyps: Secondary | ICD-10-CM | POA: Diagnosis not present

## 2020-09-12 DIAGNOSIS — F4321 Adjustment disorder with depressed mood: Secondary | ICD-10-CM | POA: Diagnosis not present

## 2020-09-12 DIAGNOSIS — K5790 Diverticulosis of intestine, part unspecified, without perforation or abscess without bleeding: Secondary | ICD-10-CM | POA: Diagnosis not present

## 2020-09-12 DIAGNOSIS — N528 Other male erectile dysfunction: Secondary | ICD-10-CM | POA: Diagnosis not present

## 2020-09-12 DIAGNOSIS — I130 Hypertensive heart and chronic kidney disease with heart failure and stage 1 through stage 4 chronic kidney disease, or unspecified chronic kidney disease: Secondary | ICD-10-CM | POA: Diagnosis not present

## 2020-09-12 DIAGNOSIS — H9313 Tinnitus, bilateral: Secondary | ICD-10-CM | POA: Diagnosis not present

## 2020-09-12 DIAGNOSIS — G300 Alzheimer's disease with early onset: Secondary | ICD-10-CM | POA: Diagnosis not present

## 2020-09-12 DIAGNOSIS — M1A9XX0 Chronic gout, unspecified, without tophus (tophi): Secondary | ICD-10-CM | POA: Diagnosis not present

## 2020-09-12 DIAGNOSIS — N401 Enlarged prostate with lower urinary tract symptoms: Secondary | ICD-10-CM | POA: Diagnosis not present

## 2020-09-12 DIAGNOSIS — K7460 Unspecified cirrhosis of liver: Secondary | ICD-10-CM | POA: Diagnosis not present

## 2020-09-12 DIAGNOSIS — N183 Chronic kidney disease, stage 3 unspecified: Secondary | ICD-10-CM | POA: Diagnosis not present

## 2020-09-12 DIAGNOSIS — E559 Vitamin D deficiency, unspecified: Secondary | ICD-10-CM | POA: Diagnosis not present

## 2020-09-12 DIAGNOSIS — E7800 Pure hypercholesterolemia, unspecified: Secondary | ICD-10-CM | POA: Diagnosis not present

## 2020-09-12 DIAGNOSIS — Z950 Presence of cardiac pacemaker: Secondary | ICD-10-CM | POA: Diagnosis not present

## 2020-09-12 DIAGNOSIS — E785 Hyperlipidemia, unspecified: Secondary | ICD-10-CM | POA: Diagnosis not present

## 2020-09-12 DIAGNOSIS — Z7982 Long term (current) use of aspirin: Secondary | ICD-10-CM | POA: Diagnosis not present

## 2020-09-12 DIAGNOSIS — Z9181 History of falling: Secondary | ICD-10-CM | POA: Diagnosis not present

## 2020-09-14 DIAGNOSIS — G20 Parkinson's disease: Secondary | ICD-10-CM | POA: Diagnosis not present

## 2020-09-14 DIAGNOSIS — K7460 Unspecified cirrhosis of liver: Secondary | ICD-10-CM | POA: Diagnosis not present

## 2020-09-14 DIAGNOSIS — F0280 Dementia in other diseases classified elsewhere without behavioral disturbance: Secondary | ICD-10-CM | POA: Diagnosis not present

## 2020-09-14 DIAGNOSIS — F3289 Other specified depressive episodes: Secondary | ICD-10-CM | POA: Diagnosis not present

## 2020-09-14 DIAGNOSIS — M545 Low back pain, unspecified: Secondary | ICD-10-CM | POA: Diagnosis not present

## 2020-09-14 DIAGNOSIS — D696 Thrombocytopenia, unspecified: Secondary | ICD-10-CM | POA: Diagnosis not present

## 2020-09-14 DIAGNOSIS — F0150 Vascular dementia without behavioral disturbance: Secondary | ICD-10-CM | POA: Diagnosis not present

## 2020-09-14 DIAGNOSIS — G309 Alzheimer's disease, unspecified: Secondary | ICD-10-CM | POA: Diagnosis not present

## 2020-09-17 ENCOUNTER — Other Ambulatory Visit: Payer: Self-pay

## 2020-09-17 ENCOUNTER — Emergency Department
Admission: EM | Admit: 2020-09-17 | Discharge: 2020-09-17 | Disposition: A | Discharge status: Home / Self Care | Payer: PPO | Attending: Emergency Medicine | Admitting: Emergency Medicine

## 2020-09-17 ENCOUNTER — Emergency Department: Payer: PPO

## 2020-09-17 DIAGNOSIS — S20419A Abrasion of unspecified back wall of thorax, initial encounter: Secondary | ICD-10-CM | POA: Diagnosis not present

## 2020-09-17 DIAGNOSIS — I110 Hypertensive heart disease with heart failure: Secondary | ICD-10-CM | POA: Insufficient documentation

## 2020-09-17 DIAGNOSIS — M545 Low back pain, unspecified: Secondary | ICD-10-CM | POA: Diagnosis not present

## 2020-09-17 DIAGNOSIS — Z041 Encounter for examination and observation following transport accident: Secondary | ICD-10-CM | POA: Diagnosis not present

## 2020-09-17 DIAGNOSIS — M2578 Osteophyte, vertebrae: Secondary | ICD-10-CM | POA: Diagnosis not present

## 2020-09-17 DIAGNOSIS — Z7982 Long term (current) use of aspirin: Secondary | ICD-10-CM | POA: Diagnosis not present

## 2020-09-17 DIAGNOSIS — Z20822 Contact with and (suspected) exposure to covid-19: Secondary | ICD-10-CM | POA: Insufficient documentation

## 2020-09-17 DIAGNOSIS — W01198A Fall on same level from slipping, tripping and stumbling with subsequent striking against other object, initial encounter: Secondary | ICD-10-CM | POA: Insufficient documentation

## 2020-09-17 DIAGNOSIS — E119 Type 2 diabetes mellitus without complications: Secondary | ICD-10-CM | POA: Insufficient documentation

## 2020-09-17 DIAGNOSIS — Z950 Presence of cardiac pacemaker: Secondary | ICD-10-CM | POA: Insufficient documentation

## 2020-09-17 DIAGNOSIS — I5032 Chronic diastolic (congestive) heart failure: Secondary | ICD-10-CM | POA: Insufficient documentation

## 2020-09-17 DIAGNOSIS — F0390 Unspecified dementia without behavioral disturbance: Secondary | ICD-10-CM | POA: Insufficient documentation

## 2020-09-17 DIAGNOSIS — Z043 Encounter for examination and observation following other accident: Secondary | ICD-10-CM | POA: Diagnosis not present

## 2020-09-17 DIAGNOSIS — M40204 Unspecified kyphosis, thoracic region: Secondary | ICD-10-CM | POA: Diagnosis not present

## 2020-09-17 DIAGNOSIS — G319 Degenerative disease of nervous system, unspecified: Secondary | ICD-10-CM | POA: Diagnosis not present

## 2020-09-17 DIAGNOSIS — I700 Atherosclerosis of aorta: Secondary | ICD-10-CM | POA: Diagnosis not present

## 2020-09-17 DIAGNOSIS — R531 Weakness: Secondary | ICD-10-CM | POA: Diagnosis not present

## 2020-09-17 DIAGNOSIS — I10 Essential (primary) hypertension: Secondary | ICD-10-CM | POA: Diagnosis not present

## 2020-09-17 DIAGNOSIS — W19XXXA Unspecified fall, initial encounter: Secondary | ICD-10-CM

## 2020-09-17 DIAGNOSIS — S24109A Unspecified injury at unspecified level of thoracic spinal cord, initial encounter: Secondary | ICD-10-CM | POA: Diagnosis present

## 2020-09-17 DIAGNOSIS — R404 Transient alteration of awareness: Secondary | ICD-10-CM | POA: Diagnosis not present

## 2020-09-17 DIAGNOSIS — S199XXA Unspecified injury of neck, initial encounter: Secondary | ICD-10-CM | POA: Diagnosis not present

## 2020-09-17 DIAGNOSIS — S22009A Unspecified fracture of unspecified thoracic vertebra, initial encounter for closed fracture: Secondary | ICD-10-CM | POA: Diagnosis not present

## 2020-09-17 LAB — URINALYSIS, COMPLETE (UACMP) WITH MICROSCOPIC
Bacteria, UA: NONE SEEN
Bilirubin Urine: NEGATIVE
Glucose, UA: NEGATIVE mg/dL
Hgb urine dipstick: NEGATIVE
Ketones, ur: NEGATIVE mg/dL
Leukocytes,Ua: NEGATIVE
Nitrite: NEGATIVE
Protein, ur: NEGATIVE mg/dL
Specific Gravity, Urine: 1.025 (ref 1.005–1.030)
Squamous Epithelial / HPF: NONE SEEN (ref 0–5)
pH: 7 (ref 5.0–8.0)

## 2020-09-17 LAB — COMPREHENSIVE METABOLIC PANEL
ALT: 23 U/L (ref 0–44)
AST: 34 U/L (ref 15–41)
Albumin: 3.3 g/dL — ABNORMAL LOW (ref 3.5–5.0)
Alkaline Phosphatase: 82 U/L (ref 38–126)
Anion gap: 8 (ref 5–15)
BUN: 26 mg/dL — ABNORMAL HIGH (ref 8–23)
CO2: 25 mmol/L (ref 22–32)
Calcium: 9.2 mg/dL (ref 8.9–10.3)
Chloride: 106 mmol/L (ref 98–111)
Creatinine, Ser: 1.12 mg/dL (ref 0.61–1.24)
GFR, Estimated: >60 mL/min (ref >60)
Glucose, Bld: 118 mg/dL — ABNORMAL HIGH (ref 70–99)
Potassium: 4 mmol/L (ref 3.5–5.1)
Sodium: 139 mmol/L (ref 135–145)
Total Bilirubin: 2.4 mg/dL — ABNORMAL HIGH (ref 0.3–1.2)
Total Protein: 5.7 g/dL — ABNORMAL LOW (ref 6.5–8.1)

## 2020-09-17 LAB — CBC WITH DIFFERENTIAL/PLATELET
Abs Immature Granulocytes: 0.01 10*3/uL (ref 0.00–0.07)
Basophils Absolute: 0 10*3/uL (ref 0.0–0.1)
Basophils Relative: 1 %
Eosinophils Absolute: 0.1 10*3/uL (ref 0.0–0.5)
Eosinophils Relative: 3 %
Hemoglobin: 12.4 g/dL — ABNORMAL LOW (ref 13.0–17.0)
Immature Granulocytes: 0 %
Lymphocytes Relative: 27 %
Lymphs Abs: 1.3 10*3/uL (ref 0.7–4.0)
Monocytes Absolute: 0.4 10*3/uL (ref 0.1–1.0)
Monocytes Relative: 8 %
Neutro Abs: 2.9 10*3/uL (ref 1.7–7.7)
Neutrophils Relative %: 61 %
Platelets: 75 10*3/uL — ABNORMAL LOW (ref 150–400)
Smear Review: NORMAL
WBC: 4.7 10*3/uL (ref 4.0–10.5)

## 2020-09-17 LAB — RESP PANEL BY RT-PCR (FLU A&B, COVID) ARPGX2
Influenza A by PCR: NEGATIVE
Influenza B by PCR: NEGATIVE
SARS Coronavirus 2 by RT PCR: NEGATIVE

## 2020-09-17 LAB — TROPONIN I (HIGH SENSITIVITY): Troponin I (High Sensitivity): 8 ng/L (ref <18)

## 2020-09-17 MED ORDER — ACETAMINOPHEN 500 MG PO TABS
1000.0000 mg | ORAL_TABLET | Freq: Once | ORAL | Status: AC
  Administered 2020-09-17: 1000 mg via ORAL
  Filled 2020-09-17: qty 2

## 2020-09-17 MED ORDER — SODIUM CHLORIDE 0.9 % IV SOLN: Freq: Once | INTRAVENOUS | Status: AC

## 2020-09-17 NOTE — ED Notes (Signed)
This nurse and second nurse helped pt stand up to try to void for urine sample. Pt unable to void at this time. Pt was unsteady on feet. Back in bed with rails up, blankets, call light in place. Wife at bedside.

## 2020-09-17 NOTE — ED Notes (Signed)
Pt discharged to home. Discharge instructions have been discussed with patient and/or family members. Pt verbally acknowledges understanding d/c instructions, and endorses comprehension to checkout at registration before leaving.  °

## 2020-09-17 NOTE — Discharge Instructions (Signed)
Your blood work and urine sample were reassuring today.  We did not find any traumatic injuries on the CAT scans of your head neck or spine.  You can take Tylenol for pain.  Please follow-up with your primary care provider regarding additional assistance at home or potentially a nursing facility.

## 2020-09-17 NOTE — ED Notes (Signed)
VAN negative

## 2020-09-17 NOTE — ED Provider Notes (Signed)
[facility]  ____________________________________________   Event Date/Time   First MD Initiated Contact with Patient 09/17/20 1148     (approximate)  I have reviewed the triage vital signs and the nursing notes.   HISTORY  Chief Complaint Fall    HPI Dean Lynch is a 70 y.o. male with past medical history of diabetes, thrombocytopenia, new diagnosis of Lewy body dementia and Parkinson's disease who presents after a fall.  Patient tells me that he got out of bed and thought that his feet got tangled up and then fell backwards hitting the bed.  His wife tells me that since Friday he has been a little bit more confused than normal and has been more unstable.  He has been working with physical therapy and generally doing well until Friday when he has been having difficulty getting up.  Last night he went to bed early and then in the middle of the night he was walking around the room.  She then heard him fall, and his head.  Unsure if he had loss of consciousness but patient denies loss of consciousness.  The neighbors helped him get back into bed.  Then this morning he complained of back pain and was unable to get up due to pain.  Patient's wife notes that over the last several months he has declined.  He is seeing a neurologist for management of his Lewy body dementia.  She does not want him to go to a nursing home at this time but is struggling with how frequently he is falling and the fact that she cannot help him up when he does fall.  Dors is some midthoracic back pain at this time, denies paresthesias or weakness in his extremities.  He denies lightheadedness, chest pain abdominal pain, nausea or vomiting.         Past Medical History:  Diagnosis Date   BPH (benign prostatic hyperplasia)    Cirrhosis of liver ([place])    Diabetes mellitus without complication ([place])    Gout    Hypercholesteremia    Hypertension    Skin cancer 2017   mel[facility]   Thrombocytopenia [facility])     Patient Active Problem List   Diagnosis Date Noted   Acute pain of left knee    Impaired fasting glucose    Epididymitis, right    Acute cystitis 03/24/2020   Fever    Abdominal pain, suprapubic    Cirrhosis of liver (HCC)    Weakness    Elevated INR    UTI (urinary tract infection) 03/23/2020   Dehydration    Dementia without behavioral disturbance (HCC)    Type II diabetes mellitus with renal manifestations ([place]) 99991111   Acute metabolic encephalopathy 99991111   Nausea & vomiting 02/19/2020   AKI (acute kidney injury) ([place]) 02/19/2020   Chronic diastolic CHF (congestive heart failure) ([place]) 02/19/2020   Pressure injury of skin 02/19/2020   Agitation 02/15/2020   Bradycardia 12/23/2019   Acute CHF (congestive heart failure) ([place]) 12/23/2019   Thrombocytopenia ([place]) 07/18/2016   Benign prostatic hyperplasia without lower urinary tract symptoms 07/16/2016   History of melanoma 12/06/2015   Diabetes mellitus type 2, uncomplicated ([place]) [id number]   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 tophus 12/15/2013   Essential hypertension 12/15/2013   Hypercholesterolemia 12/15/2013    Past Surgical History:  Procedure Laterality Date   CATARACT EXTRACTION W/PHACO Right 09/27/2015   Procedure: CATARACT EXTRACTION PHACO AND INTRAOCULAR LENS PLACEMENT ([place]);  Surgeon: Nila Nephew  Brasington, MD;  Location: [facility];  Service: Ophthalmology;  Laterality: Right;  DIABETIC   KNEE ARTHROSCOPY     PACEMAKER IMPLANT N/A 12/24/2019   Procedure: PACEMAKER IMPLANT;  Surgeon: Marzetta Board, MD;  Location: [facility];  Service: Cardiovascular;  Laterality: N/A;    Prior to Admission medications   Medication Sig Start Date End Date Taking? Authorizing Provider  rivastigmine (EXELON) 1.5 MG capsule Take by mouth. 09/14/20 12/13/20 Yes [provider]  aspirin EC 81 MG tablet Take 81 mg by mouth daily.  02/25/18   [provider]  divalproex  (DEPAKOTE) 125 MG DR tablet Take 125 mg by mouth at bedtime. 02/17/20   [provider]  dutasteride (AVODART) 0.5 MG capsule Take 0.5 mg by mouth every morning.    [provider]  feeding supplement (ENSURE ENLIVE / ENSURE PLUS) LIQD Take 237 mLs by mouth 3 (three) times daily between meals. 03/27/20   Loletha Grayer, MD  Omega-3 Fatty Acids (FISH OIL) 1000 MG CAPS Take 1,000 mg by mouth daily.    [provider]  traZODone (DESYREL) 100 MG tablet Take 100 mg by mouth at bedtime.    [provider]    Allergies Patient has no known allergies.  Family History  Problem Relation Age of Onset   Coronary artery disease Mother    Polycythemia Mother    Macular degeneration Mother    Dementia Mother    Coronary artery disease Father     Social History Social History   Tobacco Use   Smoking status: Never   Smokeless tobacco: Never  Vaping Use   Vaping Use: Never used  Substance Use Topics   Alcohol use: No   Drug use: No    Review of Systems   Review of Systems  Constitutional:  Negative for chills and fever.  Respiratory:  Negative for shortness of breath.   Gastrointestinal:  Negative for abdominal pain, nausea and vomiting.  Genitourinary:  Negative for dysuria.  Neurological:  Positive for weakness. Negative for dizziness, light-headedness and headaches.  Psychiatric/Behavioral:  Positive for confusion.   All other systems reviewed and are negative.  Physical Exam Updated Vital Signs BP 118/69   Pulse 75   Temp 97.8 F (36.6 C) (Oral)   Resp 15   Ht '5\' 10"'$  (1.778 m)   Wt 89.8 kg   SpO2 97%   BMI 28.41 kg/m   Physical Exam Vitals and nursing note reviewed.  Constitutional:      General: He is not in acute distress.    Appearance: Normal appearance.  HENT:     Head: Normocephalic and atraumatic.     Mouth/Throat:     Mouth: Mucous membranes are moist.  Eyes:     General: No scleral icterus.    Conjunctiva/sclera:  Conjunctivae normal.  Cardiovascular:     Rate and Rhythm: Normal rate and regular rhythm.  Pulmonary:     Effort: Pulmonary effort is normal. No respiratory distress.     Breath sounds: Normal breath sounds. No wheezing.  Abdominal:     General: Abdomen is flat.     Tenderness: There is no abdominal tenderness. There is no guarding or rebound.  Musculoskeletal:        General: No deformity or signs of injury.     Cervical back: Normal range of motion. No rigidity.     Comments: No midline C-spine tenderness Abrasion over the thoracic back, no midline T or L-spine tenderness No  chest wall tenderness or crepitus Stable nontender    Skin:    Coloration: Skin is not jaundiced or pale.  Neurological:     General: No focal deficit present.     Mental Status: He is alert. Mental status is at baseline.     Comments: Patient is oriented to person and place but not year Round and reactive to light, extraocular movements intact, 5/5 in the bilateral upper and lower extremities  Psychiatric:        Mood and Affect: Mood normal.        Behavior: Behavior normal.     LABS (all labs ordered are listed, but only abnormal results are displayed)  Labs Reviewed  COMPREHENSIVE METABOLIC PANEL - Abnormal; Notable for the following components:      Result Value   Glucose, Bld 118 (*)    BUN 26 (*)    Total Protein 5.7 (*)    Albumin 3.3 (*)    Total Bilirubin 2.4 (*)    All other components within normal limits  CBC WITH DIFFERENTIAL/PLATELET - Abnormal; Notable for the following components:   Hemoglobin 12.4 (*)    Platelets 75 (*)    All other components within normal limits  RESP PANEL BY RT-PCR (FLU A&B, COVID) [id number]  URINALYSIS, COMPLETE (UACMP) WITH MICROSCOPIC  TROPONIN I (HIGH SENSITIVITY)   ____________________________________________  EKG  Left axis deviation, right bundle branch block, left posterior fascicular block, sinus rhythm no acute ischemic  changes ____________________________________________  RADIOLOGY Almeta Monas, personally viewed and evaluated these images (plain radiographs) as part of my medical decision making, as well as reviewing the written report by the radiologist.  ED MD interpretation: I reviewed the chest x-ray which does not show any acute cardiopulmonary process    ____________________________________________   PROCEDURES  Procedure(s) performed (including Critical Care):  Procedures   ____________________________________________   INITIAL IMPRESSION / ASSESSMENT AND PLAN / ED COURSE     69 year old male with Lewy body dementia and Parkinson's disease who presents after a fall.  Vital signs are within normal limits and he is overall well-appearing.  He is disoriented to time but his neurologic exam is otherwise unremarkable.  He has an abrasion over thoracic back but no midline tenderness.  He has equal strength in his extremities.  In speaking with his wife it sounds like he has had somewhat of an acute on chronic decline, worsening over the last several days.  He has had multiple falls including last night and was unable to get up.  I am concerned that this is part of the progression of his underlying disease but will also rule out underlying infectious or metabolic etiologies that could be leading to this acute decline.  We will obtain CT head C spine as well as T and L-spine given he was unable to get up today after the trauma.  I did broach the subject of nursing facility with his wife given the frequent falls.  We will rediscuss this pending the rest of his work-up.  Patient's work-up here is rather unremarkable.  No traumatic injuries.  He has no evidence of UTI, hemoglobin is stable.  Electrolytes all within normal limits, no AKI.  I had a long discussion with the patient's wife regarding the best course of action.  She was tearful and is reluctant to have him placed in a nursing facility  but knows that is likely the right thing to do.  Patient has home physical therapy and of primary  physician.  I recommended that they discuss either a higher level of care at home or placement with the primary physician.  Given he would likely wait multiple days in the ER for placement here I do not feel like this is the best option.  She agreed that he would best be served with discharge.      ____________________________________________   FINAL CLINICAL IMPRESSION(S) / ED DIAGNOSES  Final diagnoses:  Fall, initial encounter     ED Discharge Orders     None        Note:  This document was prepared using Dragon voice recognition software and may include unintentional dictation errors.    Rada Hay, MD 09/17/20 2102

## 2020-09-17 NOTE — ED Notes (Signed)
ED Provider at bedside. 

## 2020-09-17 NOTE — ED Notes (Signed)
Wife endorses increased confusion, pt exhibiting increased difficulty ambulating, especially at night.

## 2020-09-17 NOTE — ED Notes (Signed)
Pt and family member aware urine specimen ordered. Pt reports inability to provide specimen at this time. Specimen collection device provided to patient.

## 2020-09-17 NOTE — ED Notes (Signed)
Pt in CT.

## 2020-09-17 NOTE — ED Triage Notes (Addendum)
Pt arrives [facility], reports increase falls, witnessed mechanical fall last night around midnite. hx of dementia. Pt has pacemaker. Pt also c/o lower back pain

## 2020-09-18 ENCOUNTER — Other Ambulatory Visit: Payer: PPO | Admitting: Nurse Practitioner

## 2020-09-18 ENCOUNTER — Encounter: Payer: Self-pay | Admitting: Nurse Practitioner

## 2020-09-18 DIAGNOSIS — F0280 Dementia in other diseases classified elsewhere without behavioral disturbance: Secondary | ICD-10-CM

## 2020-09-18 DIAGNOSIS — G309 Alzheimer's disease, unspecified: Secondary | ICD-10-CM | POA: Diagnosis not present

## 2020-09-18 DIAGNOSIS — Z515 Encounter for palliative care: Secondary | ICD-10-CM

## 2020-09-18 DIAGNOSIS — R5381 Other malaise: Secondary | ICD-10-CM | POA: Diagnosis not present

## 2020-09-18 NOTE — Progress Notes (Signed)
[place] Consult Note Telephone: [phone]  Fax: [phone]    Date of encounter: 09/18/20 4:12 PM PATIENT NAME: Dean Lynch [place] [id number]   [phone] (home)  DOB: [birth year] MRN: [id number] PRIMARY CARE PROVIDER:    Toni Arthurs, NP,  [place] [id number] [phone] RESPONSIBLE PARTY:    Contact Information     Name Relation Home Work Mobile   Dean, Lynch [id number]  [phone]      Due to the COVID-19 crisis, this visit was done via telemedicine from my office and it was initiated and consent by this patient and or family.  I connected with  Dean Lynch, wife with Dean Lynch on 09/18/20 by a video enabled telemedicine application and verified that I am speaking with the correct person.   I discussed the limitations of evaluation and management by telemedicine. The patient expressed understanding and agreed to proceed. This is a follow up visit.                                  ASSESSMENT AND PLAN / RECOMMENDATIONS:  Symptom Management/Plan: 1. ACP: DNR in place; in vynca; continue treat what is treatable   2. Debility worsening secondary to Parkinson with Alzheimer's dementia progressive; encourage mobility, self independence as able, fall risk; discussed pace program at length, Dean Lynch in agreement to have referral completed for pace to come discuss program, evaluation to explore options remains pending; asked PC SW to help to Dean Lynch connect with PACE. Also asked PC SW to look into future planning as [name] continues to decline with increase in skill level, it is becoming more difficult for Dean Lynch to care for Dean Lynch at home. We talked about possible placement which is recommended by Neurology as symptoms are progressive and will continue to decline. We talked about Hospice option for in home assistance  through Medicare benefit. Reviewed with Hospice Physicians and not eligible at this time, not enough of a decline.    3. Palliative care encounter; Palliative care encounter; Palliative medicine team will continue to support patient, patient's family, and medical team. Visit consisted of counseling and education dealing with the complex and emotionally intense issues of symptom management and palliative care in the setting of serious and potentially life-threatening illness  Follow up Palliative Care Visit: Palliative care will continue to follow for complex medical decision making, advance care planning, and clarification of goals. Return 4 weeks or prn.  I spent 46 minutes providing this consultation. More than 50% of the time in this consultation was spent in counseling and care coordination. PPS: 40%  Chief Complaint: Follow up palliative consult for complex medical decision making  HISTORY OF PRESENT ILLNESS:  Dean Lynch is a 70 y.o. year old male  with multiple medical problems including  Parkinson, dementia, Chronic diastolic congestive heart failure, cirrhosis of liver, Diabetes, hypertension, hypercholesterolemia, BPH, gout, thrombocytopenia, Melanoma removed 2017, pacemaker implant, right cataract extraction with PHACO. Received a message and returned call to [facility], discussed increase in functional decline of Dean Lynch including fall this past weekend requiring ED visit. Scheduled f/u PC telemedicine phone visit as video not available. Ms. Lynch in agreement. We talked about how Dean Lynch has been feeling. Appetite has been declined the last 2 days. Dean Lynch does have weighted utensils for Dean Lynch to  use. It takes Dean Lynch about 30 minutes to eat, does not appear to be chocking or coughing during eating or drinking. At times it does require Ms Lynch to remind Dean Lynch to chew. Dean Lynch does not appear to be pocketing his food. Ms.  Lynch endorses he appear not to be having a lot of pain. We talked about increase in intermit confusion. Therapy came out last week to complete visits, had Dean Lynch walking in the driveway. We talked about Dean Lynch sleeing during the day. No recent wounds, infections. We talked about overall decline in chronic disease of parkinson with dementia.  Dean Lynch and I talked about purpose of pc f/u visit with increase in falls. We talked about Debility worsening secondary to Parkinson with Alzheimer's dementia progressive; encourage mobility, self independence as able, fall risk; discussed pace program at length, Dean Lynch in agreement to have referral completed for pace to come discuss program, evaluation to explore options remains pending; asked PC SW to help to Ms. Dace connect with PACE. Also asked PC SW to look into future planning as Dean Lynch continues to decline with increase in skill level, it is becoming more difficult for Dean. Arther to care for Dean Lynch at home. We talked about possible placement which is recommended by Neurology as symptoms are progressive and will continue to decline. We talked about Hospice option for in home assistance through Medicare benefit. Reviewed with Hospice Physicians and not eligible at this time, not enough of a decline. We talked about f/u pc visit, scheduled. Therapeutic listening, emotional support provided. Questions answered.   History obtained from review of EMR, discussion with Dean Lynch with Dean Lynch.  I reviewed available labs, medications, imaging, studies and related documents from the EMR.  Records reviewed and summarized above.   ROS Full 14 system review of systems performed and negative with exception of: as per HPI.   Physical Exam: Deferred  Questions and concerns were addressed. The wife was encouraged to call with questions and/or concerns. My contact information was provided. Provided general support  and encouragement, no other unmet needs identified   Thank you for the opportunity to participate in the care of Dean Lynch.  The palliative care team will continue to follow. Please call our office at [phone] if we can be of additional assistance.   This chart was dictated using voice recognition software.  Despite best efforts to proofread,  errors can occur which can change the documentation meaning.   Skya Mccullum Ihor Gully, NP

## 2020-09-19 ENCOUNTER — Telehealth: Payer: Self-pay | Admitting: *Deleted

## 2020-09-19 NOTE — Telephone Encounter (Signed)
He doesn't have to come in to see me. Please reach to his pcp and se eif he would be willing to check his cbc q6 months and f/u with him instead of seeing me.

## 2020-09-19 NOTE — Telephone Encounter (Signed)
I tried calling wife back and did not get an answer or voice mail

## 2020-09-19 NOTE — Telephone Encounter (Signed)
Received a call from wife reporting that patient dementia is getting worse and that his mobility has greatly declined and he is falling frequently. He had to go to ER Sunday  and she is unsure she can get him to his appointment with [place] for labs tomorrow. She is asking if Dr Janese Banks could just look at the results of labs drawn Sunday so that she does not have to try to bring him in. Please advise  CBC with Differential Order: [id number] Status: Final result   Visible to patient: Yes (seen)   Next appt: 09/20/2020 at 01:45 PM in Oncology ([facility])   0 Result Notes Component Ref Range & Units 2 d ago  (09/17/20) 3 mo ago  (06/20/20) 5 mo ago  (03/25/20)  WBC 4.0 - 10.5 K/uL 4.7  4.7  6.0   RBC 4.22 - 5.81 MIL/uL RESULTS UNAVAILABLE DUE TO INTERFERING SUBSTANCE  3.54 Low   2.37 Low    Hemoglobin 13.0 - 17.0 g/dL 12.4 Low   12.7 Low   9.0 Low    HCT 39.0 - 52.0 % RESULTS UNAVAILABLE DUE TO INTERFERING SUBSTANCE  34.3 Low   24.0 Low    MCV 80.0 - 100.0 fL RESULTS UNAVAILABLE DUE TO INTERFERING SUBSTANCE  96.9  101.3 High    MCH 26.0 - 34.0 pg RESULTS UNAVAILABLE DUE TO INTERFERING SUBSTANCE  35.9 High   38.0 High    MCHC 30.0 - 36.0 g/dL RESULTS UNAVAILABLE DUE TO INTERFERING SUBSTANCE  37.0 High   37.5 High    RDW 11.5 - 15.5 % RESULTS UNAVAILABLE DUE TO INTERFERING SUBSTANCE  13.2  13.4   Platelets 150 - 400 K/uL 75 Low   75 Low   73 Low  CM   Comment: Immature Platelet Fraction may be  clinically indicated, consider  ordering this additional test  [id number]   nRBC 0.0 - 0.2 % RESULTS UNAVAILABLE DUE TO INTERFERING SUBSTANCE  0.0  0.0 CM   Neutrophils Relative % % 61  59    Neutro Abs 1.7 - 7.7 K/uL 2.9  2.8    Lymphocytes Relative % 27  31    Lymphs Abs 0.7 - 4.0 K/uL 1.3  1.5    Monocytes Relative % 8  8    Monocytes Absolute 0.1 - 1.0 K/uL 0.4  0.4    Eosinophils Relative % 3  2    Eosinophils Absolute 0.0 - 0.5 K/uL 0.1  0.1    Basophils Relative % 1  0    Basophils Absolute 0.0 - 0.1  K/uL 0.0  0.0    WBC Morphology  MORPHOLOGY UNREMARKABLE     RBC Morphology  MORPHOLOGY UNREMARKABLE     Smear Review  Normal platelet morphology     Comment: PLATELETS APPEAR DECREASED  Immature Granulocytes % 0  0    Abs Immature Granulocytes 0.00 - 0.07 K/uL 0.01  0.01 CM    Comment: Performed at [facility], [address] [place], [place] [address]  Resulting Agency  [place] [facility] [place] [facility] [place] [facility]      Urinalysis, Complete w Microscopic Order: [id number] Status: Final result   Visible to patient: Yes (seen)   Next appt: 09/20/2020 at 01:45 PM in Oncology ([facility])   0 Result Notes Component Ref Range & Units 2 d ago  (09/17/20) 6 mo ago  (03/23/20)  Color, Urine YELLOW YELLOW  AMBER Abnormal  CM   APPearance CLEAR CLEAR  CLOUDY Abnormal  Specific Gravity, Urine 1.005 - 1.030 1.025  1.024   pH 5.0 - 8.0 7.0  5.0   Glucose, UA NEGATIVE mg/dL NEGATIVE  NEGATIVE   Hgb urine dipstick NEGATIVE NEGATIVE  LARGE Abnormal    Bilirubin Urine NEGATIVE NEGATIVE  NEGATIVE   Ketones, ur NEGATIVE mg/dL NEGATIVE  NEGATIVE   Protein, ur NEGATIVE mg/dL NEGATIVE  100 Abnormal    Nitrite NEGATIVE NEGATIVE  NEGATIVE   Leukocytes,Ua NEGATIVE NEGATIVE  MODERATE Abnormal    Squamous Epithelial / LPF 0 - 5 NONE SEEN  0-5   WBC, UA 0 - 5 WBC/hpf 0-5  >50 High    RBC / HPF 0 - 5 RBC/hpf 0-5  21-50   Bacteria, UA NONE SEEN NONE SEEN  FEW Abnormal    Mucus  PRESENT  PRESENT CM   Hyaline Casts, UA  PRESENT    Comment: Performed at [facility], [address] [place], [place] [address]  Resulting Agency  [facility]        Contains abnormal data Comprehensive metabolic panel Order: [id number] Status: Final result   Visible to patient: Yes (seen)   Next appt: 09/20/2020 at 01:45 PM in Oncology ([facility])   0 Result Notes Component Ref Range & Units 2 d ago  (09/17/20) 3 mo ago  (06/20/20) 5 mo ago  (03/25/20)  Sodium 135 - 145 mmol/L 139  139  136    Potassium 3.5 - 5.1 mmol/L 4.0  4.3  3.9   Chloride 98 - 111 mmol/L 106  104  107   CO2 22 - 32 mmol/L '25  28  23   '$ Glucose, Bld 70 - 99 mg/dL 118 High   137 High  CM  196 High  CM   Comment: Glucose reference range applies only to samples taken after fasting for at least 8 hours.  BUN 8 - 23 mg/dL 26 High   22  26 High    Creatinine, Ser 0.61 - 1.24 mg/dL 1.12  1.22  1.11   Calcium 8.9 - 10.3 mg/dL 9.2  9.1  8.2 Low    Total Protein 6.5 - 8.1 g/dL 5.7 Low   5.7 Low   4.4 Low    Albumin 3.5 - 5.0 g/dL 3.3 Low   3.2 Low   2.4 Low    AST 15 - 41 U/L 34  29  27   ALT 0 - 44 U/L '23  20  17   '$ Alkaline Phosphatase 38 - 126 U/L 82  79  59   Total Bilirubin 0.3 - 1.2 mg/dL 2.4 High   1.6 High   1.9 High    GFR, Estimated >60 mL/min >60  >60 CM  >60 CM   Comment: (NOTE)  Calculated using the CKD-EPI Creatinine Equation (2021)   Anion gap 5 - '15 8  7 '$ CM  6 CM   Comment: Performed at [facility], [address] [place], [place] [address]  Resulting Agency  [facility] [place] [facility]      Troponin I (High Sensitivity) Order: [id number] Status: Final result   Visible to patient: Yes (seen)   Next appt: 09/20/2020 at 01:45 PM in Oncology ([facility])   0 Result Notes Component Ref Range & Units 2 d ago  (09/17/20)  Troponin I (High Sensitivity) <18 ng/L 8   Comment: (NOTE)  Elevated high sensitivity troponin I (hsTnI) values and significant  changes across serial measurements may  suggest ACS but many other  chronic and acute conditions are known to elevate hsTnI results.  Refer to the "Links" section for chest pain algorithms and additional  guidance.  Performed at [facility], [address] [place],  [place] [address]   Resulting Agency  [facility]         Specimen Collected: 09/17/20 12:01 Last Resulted: 09/17/20 13:11

## 2020-09-20 ENCOUNTER — Inpatient Hospital Stay: Payer: PPO

## 2020-09-20 NOTE — Telephone Encounter (Signed)
I attempted calling wife back again this morning and now get a busy signal. I have also contacted patient PCP Lilia Pro office to see if she will take over following CBC every 6 months and seeing patient. Message was taken and I was told they would get back to me.

## 2020-09-21 ENCOUNTER — Other Ambulatory Visit: Payer: Self-pay | Admitting: Gastroenterology

## 2020-09-21 DIAGNOSIS — K7460 Unspecified cirrhosis of liver: Secondary | ICD-10-CM | POA: Diagnosis not present

## 2020-09-21 NOTE — Telephone Encounter (Signed)
I faxed through the epic site and attached the labs from ER 9/11 and then typed messsage: good morning Larene Beach, Dr. Janese Banks wanted to see if you could get cbc in 6 months for this pt. the wife called and said   reporting that patient dementia is getting worse and that his mobility has greatly declined and he is falling frequently. he was in ER on Sunday so we got labs then. it can be scheduled on a date that he already has for  your office. Pleae let us know.   thank you, sherry - nurse for Janese Banks   Then I sent the above and it faxed to [facility] at [facility] in [place]

## 2020-09-21 NOTE — Telephone Encounter (Signed)
Got a return call From PCP Toni Arthurs, NP who accepts taking over patient care with CBC checks every 6 months. I again attempted to contact wife on mobile number and unable to reach her, I called the home phone and got voice mail and left detailed message that patient does not need to come back to see Dr Janese Banks and that Littie Deeds, NP will be taking over his care

## 2020-09-22 DIAGNOSIS — Z86010 Personal history of colonic polyps: Secondary | ICD-10-CM | POA: Diagnosis not present

## 2020-09-22 DIAGNOSIS — K5790 Diverticulosis of intestine, part unspecified, without perforation or abscess without bleeding: Secondary | ICD-10-CM | POA: Diagnosis not present

## 2020-09-22 DIAGNOSIS — M1A9XX0 Chronic gout, unspecified, without tophus (tophi): Secondary | ICD-10-CM | POA: Diagnosis not present

## 2020-09-22 DIAGNOSIS — Z9181 History of falling: Secondary | ICD-10-CM | POA: Diagnosis not present

## 2020-09-22 DIAGNOSIS — N39498 Other specified urinary incontinence: Secondary | ICD-10-CM | POA: Diagnosis not present

## 2020-09-22 DIAGNOSIS — F4321 Adjustment disorder with depressed mood: Secondary | ICD-10-CM | POA: Diagnosis not present

## 2020-09-22 DIAGNOSIS — E785 Hyperlipidemia, unspecified: Secondary | ICD-10-CM | POA: Diagnosis not present

## 2020-09-22 DIAGNOSIS — F0280 Dementia in other diseases classified elsewhere without behavioral disturbance: Secondary | ICD-10-CM | POA: Diagnosis not present

## 2020-09-22 DIAGNOSIS — N401 Enlarged prostate with lower urinary tract symptoms: Secondary | ICD-10-CM | POA: Diagnosis not present

## 2020-09-22 DIAGNOSIS — N183 Chronic kidney disease, stage 3 unspecified: Secondary | ICD-10-CM | POA: Diagnosis not present

## 2020-09-22 DIAGNOSIS — I441 Atrioventricular block, second degree: Secondary | ICD-10-CM | POA: Diagnosis not present

## 2020-09-22 DIAGNOSIS — I130 Hypertensive heart and chronic kidney disease with heart failure and stage 1 through stage 4 chronic kidney disease, or unspecified chronic kidney disease: Secondary | ICD-10-CM | POA: Diagnosis not present

## 2020-09-22 DIAGNOSIS — G300 Alzheimer's disease with early onset: Secondary | ICD-10-CM | POA: Diagnosis not present

## 2020-09-22 DIAGNOSIS — E1122 Type 2 diabetes mellitus with diabetic chronic kidney disease: Secondary | ICD-10-CM | POA: Diagnosis not present

## 2020-09-22 DIAGNOSIS — K7460 Unspecified cirrhosis of liver: Secondary | ICD-10-CM | POA: Diagnosis not present

## 2020-09-22 DIAGNOSIS — Z7982 Long term (current) use of aspirin: Secondary | ICD-10-CM | POA: Diagnosis not present

## 2020-09-22 DIAGNOSIS — G4700 Insomnia, unspecified: Secondary | ICD-10-CM | POA: Diagnosis not present

## 2020-09-22 DIAGNOSIS — E559 Vitamin D deficiency, unspecified: Secondary | ICD-10-CM | POA: Diagnosis not present

## 2020-09-22 DIAGNOSIS — H9313 Tinnitus, bilateral: Secondary | ICD-10-CM | POA: Diagnosis not present

## 2020-09-22 DIAGNOSIS — Z950 Presence of cardiac pacemaker: Secondary | ICD-10-CM | POA: Diagnosis not present

## 2020-09-22 DIAGNOSIS — N528 Other male erectile dysfunction: Secondary | ICD-10-CM | POA: Diagnosis not present

## 2020-09-22 DIAGNOSIS — E7800 Pure hypercholesterolemia, unspecified: Secondary | ICD-10-CM | POA: Diagnosis not present

## 2020-09-22 DIAGNOSIS — I509 Heart failure, unspecified: Secondary | ICD-10-CM | POA: Diagnosis not present

## 2020-10-04 ENCOUNTER — Telehealth: Payer: Self-pay

## 2020-10-04 NOTE — Telephone Encounter (Signed)
10/04/20 @ 4 PM: Palliative care SW and RN outreached patients spouse to f/u on needs and possibly reschedule visit from today as wife had shared with [facility] RN previously that patient had [place] therapy starting today and wanted to cancel PC visit.   Call unsuccessful. SW LVM. Awaiting return call.

## 2020-10-05 NOTE — Progress Notes (Signed)
Patients spouse called and left VM for SW requesting to cancel scheduled in home visit for Fri 9/30 @10am .

## 2020-10-06 ENCOUNTER — Telehealth: Payer: Self-pay

## 2020-10-06 NOTE — Telephone Encounter (Signed)
Message received from wife requesting a call back.  Joint call made with [place], SW.  Discussed recent visit from therapy.  PT will see patient 1 x weekly.  OT completed and evaluation yesterday and would be appropriate for services but patient declined.  PACE program contacted wife to further discuss services.  Wife did not accept services as she does not believe patient will participate.  Wife discussed the challenges of caregiving and spouse refusal to participate.  Discussion on placement and wife is undecided as she recalls recommendations of placement but also states patient will refuse.  She also recalls placing her mother in a nursing home and the complicated dynamics this caused in their relationship.  Offered a visit for tomorrow at 7 am.  Wife reluctant but agreed.

## 2020-10-11 ENCOUNTER — Ambulatory Visit
Admission: RE | Admit: 2020-10-11 | Discharge: 2020-10-11 | Disposition: A | Discharge status: Home / Self Care | Payer: PPO | Source: Ambulatory Visit | Attending: Gastroenterology | Admitting: Gastroenterology

## 2020-10-11 ENCOUNTER — Other Ambulatory Visit: Payer: PPO | Admitting: Nurse Practitioner

## 2020-10-11 ENCOUNTER — Other Ambulatory Visit: Payer: Self-pay

## 2020-10-11 ENCOUNTER — Encounter: Payer: Self-pay | Admitting: Nurse Practitioner

## 2020-10-11 DIAGNOSIS — I509 Heart failure, unspecified: Secondary | ICD-10-CM | POA: Diagnosis not present

## 2020-10-11 DIAGNOSIS — K7460 Unspecified cirrhosis of liver: Secondary | ICD-10-CM | POA: Insufficient documentation

## 2020-10-11 DIAGNOSIS — Z7982 Long term (current) use of aspirin: Secondary | ICD-10-CM | POA: Diagnosis not present

## 2020-10-11 DIAGNOSIS — R634 Abnormal weight loss: Secondary | ICD-10-CM | POA: Diagnosis not present

## 2020-10-11 DIAGNOSIS — N183 Chronic kidney disease, stage 3 unspecified: Secondary | ICD-10-CM | POA: Diagnosis not present

## 2020-10-11 DIAGNOSIS — R5381 Other malaise: Secondary | ICD-10-CM | POA: Diagnosis not present

## 2020-10-11 DIAGNOSIS — F0280 Dementia in other diseases classified elsewhere without behavioral disturbance: Secondary | ICD-10-CM

## 2020-10-11 DIAGNOSIS — K8020 Calculus of gallbladder without cholecystitis without obstruction: Secondary | ICD-10-CM | POA: Diagnosis not present

## 2020-10-11 DIAGNOSIS — I441 Atrioventricular block, second degree: Secondary | ICD-10-CM | POA: Diagnosis not present

## 2020-10-11 DIAGNOSIS — Z86010 Personal history of colonic polyps: Secondary | ICD-10-CM | POA: Diagnosis not present

## 2020-10-11 DIAGNOSIS — E785 Hyperlipidemia, unspecified: Secondary | ICD-10-CM | POA: Diagnosis not present

## 2020-10-11 DIAGNOSIS — G20 Parkinson's disease: Secondary | ICD-10-CM | POA: Diagnosis not present

## 2020-10-11 DIAGNOSIS — K5790 Diverticulosis of intestine, part unspecified, without perforation or abscess without bleeding: Secondary | ICD-10-CM | POA: Diagnosis not present

## 2020-10-11 DIAGNOSIS — Z515 Encounter for palliative care: Secondary | ICD-10-CM | POA: Diagnosis not present

## 2020-10-11 DIAGNOSIS — N401 Enlarged prostate with lower urinary tract symptoms: Secondary | ICD-10-CM | POA: Diagnosis not present

## 2020-10-11 DIAGNOSIS — M1A9XX0 Chronic gout, unspecified, without tophus (tophi): Secondary | ICD-10-CM | POA: Diagnosis not present

## 2020-10-11 DIAGNOSIS — E1122 Type 2 diabetes mellitus with diabetic chronic kidney disease: Secondary | ICD-10-CM | POA: Diagnosis not present

## 2020-10-11 DIAGNOSIS — G4700 Insomnia, unspecified: Secondary | ICD-10-CM | POA: Diagnosis not present

## 2020-10-11 DIAGNOSIS — E559 Vitamin D deficiency, unspecified: Secondary | ICD-10-CM | POA: Diagnosis not present

## 2020-10-11 DIAGNOSIS — G300 Alzheimer's disease with early onset: Secondary | ICD-10-CM | POA: Diagnosis not present

## 2020-10-11 DIAGNOSIS — N528 Other male erectile dysfunction: Secondary | ICD-10-CM | POA: Diagnosis not present

## 2020-10-11 DIAGNOSIS — E7800 Pure hypercholesterolemia, unspecified: Secondary | ICD-10-CM | POA: Diagnosis not present

## 2020-10-11 DIAGNOSIS — Z950 Presence of cardiac pacemaker: Secondary | ICD-10-CM | POA: Diagnosis not present

## 2020-10-11 DIAGNOSIS — H9313 Tinnitus, bilateral: Secondary | ICD-10-CM | POA: Diagnosis not present

## 2020-10-11 DIAGNOSIS — N39498 Other specified urinary incontinence: Secondary | ICD-10-CM | POA: Diagnosis not present

## 2020-10-11 DIAGNOSIS — Z9181 History of falling: Secondary | ICD-10-CM | POA: Diagnosis not present

## 2020-10-11 DIAGNOSIS — F4321 Adjustment disorder with depressed mood: Secondary | ICD-10-CM | POA: Diagnosis not present

## 2020-10-11 DIAGNOSIS — N200 Calculus of kidney: Secondary | ICD-10-CM | POA: Diagnosis not present

## 2020-10-11 DIAGNOSIS — G309 Alzheimer's disease, unspecified: Secondary | ICD-10-CM | POA: Diagnosis not present

## 2020-10-11 DIAGNOSIS — I130 Hypertensive heart and chronic kidney disease with heart failure and stage 1 through stage 4 chronic kidney disease, or unspecified chronic kidney disease: Secondary | ICD-10-CM | POA: Diagnosis not present

## 2020-10-11 MED ORDER — IOHEXOL 350 MG/ML SOLN
100.0000 mL | Freq: Once | INTRAVENOUS | Status: AC | PRN
  Administered 2020-10-11: 100 mL via INTRAVENOUS

## 2020-10-11 NOTE — Progress Notes (Signed)
[facility] Consult Note Telephone: [phone]  Fax: [phone]    Date of encounter: 10/11/20 2:54 PM PATIENT NAME: Dean Lynch [place] [id number]   [phone] (home)  DOB: 03-28-1950 MRN: [id number] PRIMARY CARE PROVIDER:    Toni Arthurs, NP,  [place] [address] [phone] RESPONSIBLE PARTY:    Contact Information     Name Relation Home Work [address]   [place], C[place]OP[place] [phone]  [phone]      I met face to face with patient and wife in home. Palliative Care was asked to follow this patient by consultation request of  Toni Arthurs, NP to address advance care planning and complex medical decision making. This is a follow up visit.                                   ASSESSMENT AND PLAN / RECOMMENDATIONS:  Symptom Management/Plan: 1. ACP: DNR in place; in vynca; continue treat what is treatable; Ms. Borowiak had requested Hospice services as Mr. Murad is sleeping more. Last fall about 1 week ago. We talked about Hospice criteria, when reviewed by Hospice Physicians with appetite good at that time though slow decline best plan to continue to monitor with palliative as he did not meet criteria. Mrs. Errington, Mr Thoma and I talked about Hospice philosophy, we talked about CT scan scheduled today with possible MRI following. We talked about comfort care vs aggressive interventions. Mrs Nabers endorses she would not want aggressive interventions. We talked about waiting to get the results from CT scan, depending on what resulted will further discussion of goc and revisit Hospice eligibility. Mrs. Varon in agreement.    2. Debility worsening secondary to Parkinson with Alzheimer's dementia progressive; encourage mobility, self independence as able, fall risk; Previous discussion with Mrs. Hasting about PACE program which she decided financially not able  to do. We talked about current physical therapy. The physical therapist was finishing up at the start of pc visit. Physical therapist endorses some decline, he is able to ambulate with walker/assistance.   3. Palliative care encounter; Palliative care encounter; Palliative medicine team will continue to support patient, patient's family, and medical team. Visit consisted of counseling and education dealing with the complex and emotionally intense issues of symptom management and palliative care in the setting of serious and potentially life-threatening illness  Follow up Palliative Care Visit: Palliative care will continue to follow for complex medical decision making, advance care planning, and clarification of goals. Return 2 weeks or prn.  I spent 63 minutes providing this consultation. More than 50% of the time in this consultation was spent in counseling and care coordination. PPS: 40%  Chief Complaint: Follow up palliative consult for complex medical decision making  HISTORY OF PRESENT ILLNESS:  Dean Lynch is a 70 y.o. year old male  with multiple medical problems including  Parkinson, dementia, Chronic diastolic congestive heart failure, cirrhosis of liver, Diabetes, hypertension, hypercholesterolemia, BPH, gout, thrombocytopenia, Melanoma removed 2017, pacemaker implant, right cataract extraction with [facility]. I called Mrs Tanguma as regards to wishes for Hospice referral. Mrs. Bober and I talked about Hospice criteria. Discussed current role PC. Mrs. Clippard in agreement to have PC f/u visit today, scheduled with covid screening negative. Upon arrival, Mr Parekh was working with the physical therapist. The physical therapist was finishing up at the start of pc  visit. Physial therapist endorses some decline, he is able to ambulate with walker/assistance. Mr. Teaster was sitting down on the couch. Mr. Stracke does make eye contact, able to verbalize needs, forgetful. Mr.  Portilla denies ros. We talked about appetite which remains improved. Current weight 207 lbs weighed during visit. Discussed weight thought could be fluid? Will see what scan shows. We talked about further planning with chronic disease progression of parkinson/dementia/cirrhosis. Ms. Capers had requested Hospice services as Mr. Domingo is sleeping more. Last fall about 1 week ago. We talked about Hospice criteria, when reviewed by Hospice Physicians with appetite good at that time though slow decline best plan to continue to monitor with palliative as he did not meet criteria. Mrs. Gastelum, Mr Olivares and I talked about Hospice philosophy, we talked about CT scan scheduled today with possible MRI following. We talked about comfort care vs aggressive interventions. Mrs Nicolosi endorses she would not want aggressive interventions. We talked about waiting to get the results from CT scan, depending on what resulted will further discussion of goc and revisit Hospice eligibility. Mrs. Windsor in agreement. Medical goals reviewed. We talked about role pc in poc. Therapeutic listening/emotional support provided. Questions answered. Updated [name] PC SW  History obtained from review of EMR, discussion with Mrs and  Mr. Wiederholt.  I reviewed available labs, medications, imaging, studies and related documents from the EMR.  Records reviewed and summarized above.   ROS Full 10 system review of systems performed and negative with exception of: as per HPI.   Physical Exam: Constitutional: NAD General: pleasant cognitively impaired male EYES: lids intact ENMT: oral mucous membranes moist CV: S1S2, RRR, mild BLE edema Pulmonary: LCTA, no increased work of breathing, no cough, room air Abdomen: normo-active BS + 4 quadrants, soft and non tender MSK: ambulatory once helped to standing position, uses walker Skin: warm and dry Neuro:  +generalized weakness,  + cognitive impairment Psych:  non-anxious affect, A and Oriented to person, place  Questions and concerns were addressed. The patient/wife was encouraged to call with questions and/or concerns.  Provided general support and encouragement, no other unmet needs identified   Thank you for the opportunity to participate in the care of Mr. Franchi.  The palliative care team will continue to follow. Please call our office at [phone] if we can be of additional assistance.   This chart was dictated using voice recognition software.  Despite best efforts to proofread,  errors can occur which can change the documentation meaning.   Cherity Blickenstaff Z Thiago Ragsdale, NP   COVID-19 PATIENT SCREENING TOOL Asked and negative response unless otherwise noted:   Have you had symptoms of covid, tested positive or been in contact with someone with symptoms/positive test in the past 5-10 days?  NO

## 2020-10-20 DIAGNOSIS — I10 Essential (primary) hypertension: Secondary | ICD-10-CM | POA: Diagnosis not present

## 2020-10-20 DIAGNOSIS — D696 Thrombocytopenia, unspecified: Secondary | ICD-10-CM | POA: Diagnosis not present

## 2020-10-20 DIAGNOSIS — N1832 Chronic kidney disease, stage 3b: Secondary | ICD-10-CM | POA: Diagnosis not present

## 2020-10-20 DIAGNOSIS — K7460 Unspecified cirrhosis of liver: Secondary | ICD-10-CM | POA: Diagnosis not present

## 2020-10-23 DIAGNOSIS — G300 Alzheimer's disease with early onset: Secondary | ICD-10-CM | POA: Diagnosis not present

## 2020-10-23 DIAGNOSIS — N401 Enlarged prostate with lower urinary tract symptoms: Secondary | ICD-10-CM | POA: Diagnosis not present

## 2020-10-23 DIAGNOSIS — I130 Hypertensive heart and chronic kidney disease with heart failure and stage 1 through stage 4 chronic kidney disease, or unspecified chronic kidney disease: Secondary | ICD-10-CM | POA: Diagnosis not present

## 2020-10-23 DIAGNOSIS — E7800 Pure hypercholesterolemia, unspecified: Secondary | ICD-10-CM | POA: Diagnosis not present

## 2020-10-23 DIAGNOSIS — E785 Hyperlipidemia, unspecified: Secondary | ICD-10-CM | POA: Diagnosis not present

## 2020-10-23 DIAGNOSIS — E1122 Type 2 diabetes mellitus with diabetic chronic kidney disease: Secondary | ICD-10-CM | POA: Diagnosis not present

## 2020-10-23 DIAGNOSIS — K5790 Diverticulosis of intestine, part unspecified, without perforation or abscess without bleeding: Secondary | ICD-10-CM | POA: Diagnosis not present

## 2020-10-23 DIAGNOSIS — E559 Vitamin D deficiency, unspecified: Secondary | ICD-10-CM | POA: Diagnosis not present

## 2020-10-23 DIAGNOSIS — I441 Atrioventricular block, second degree: Secondary | ICD-10-CM | POA: Diagnosis not present

## 2020-10-23 DIAGNOSIS — K7460 Unspecified cirrhosis of liver: Secondary | ICD-10-CM | POA: Diagnosis not present

## 2020-10-23 DIAGNOSIS — G4700 Insomnia, unspecified: Secondary | ICD-10-CM | POA: Diagnosis not present

## 2020-10-23 DIAGNOSIS — N39498 Other specified urinary incontinence: Secondary | ICD-10-CM | POA: Diagnosis not present

## 2020-10-23 DIAGNOSIS — M1A9XX0 Chronic gout, unspecified, without tophus (tophi): Secondary | ICD-10-CM | POA: Diagnosis not present

## 2020-10-23 DIAGNOSIS — F0280 Dementia in other diseases classified elsewhere without behavioral disturbance: Secondary | ICD-10-CM | POA: Diagnosis not present

## 2020-10-23 DIAGNOSIS — I509 Heart failure, unspecified: Secondary | ICD-10-CM | POA: Diagnosis not present

## 2020-10-23 DIAGNOSIS — N183 Chronic kidney disease, stage 3 unspecified: Secondary | ICD-10-CM | POA: Diagnosis not present

## 2020-10-23 DIAGNOSIS — Z7982 Long term (current) use of aspirin: Secondary | ICD-10-CM | POA: Diagnosis not present

## 2020-10-23 DIAGNOSIS — Z86010 Personal history of colonic polyps: Secondary | ICD-10-CM | POA: Diagnosis not present

## 2020-10-23 DIAGNOSIS — Z950 Presence of cardiac pacemaker: Secondary | ICD-10-CM | POA: Diagnosis not present

## 2020-10-23 DIAGNOSIS — N528 Other male erectile dysfunction: Secondary | ICD-10-CM | POA: Diagnosis not present

## 2020-10-23 DIAGNOSIS — F4321 Adjustment disorder with depressed mood: Secondary | ICD-10-CM | POA: Diagnosis not present

## 2020-10-23 DIAGNOSIS — Z9181 History of falling: Secondary | ICD-10-CM | POA: Diagnosis not present

## 2020-10-23 DIAGNOSIS — H9313 Tinnitus, bilateral: Secondary | ICD-10-CM | POA: Diagnosis not present

## 2020-10-24 DIAGNOSIS — I441 Atrioventricular block, second degree: Secondary | ICD-10-CM | POA: Diagnosis not present

## 2020-10-26 ENCOUNTER — Other Ambulatory Visit: Payer: Self-pay

## 2020-10-26 ENCOUNTER — Encounter: Payer: Self-pay | Admitting: Nurse Practitioner

## 2020-10-26 ENCOUNTER — Other Ambulatory Visit: Payer: PPO | Admitting: Nurse Practitioner

## 2020-10-26 DIAGNOSIS — R5381 Other malaise: Secondary | ICD-10-CM

## 2020-10-26 DIAGNOSIS — G309 Alzheimer's disease, unspecified: Secondary | ICD-10-CM | POA: Diagnosis not present

## 2020-10-26 DIAGNOSIS — Z515 Encounter for palliative care: Secondary | ICD-10-CM | POA: Diagnosis not present

## 2020-10-26 DIAGNOSIS — G20 Parkinson's disease: Secondary | ICD-10-CM

## 2020-10-26 DIAGNOSIS — R443 Hallucinations, unspecified: Secondary | ICD-10-CM

## 2020-10-26 DIAGNOSIS — F0280 Dementia in other diseases classified elsewhere without behavioral disturbance: Secondary | ICD-10-CM

## 2020-10-26 NOTE — Progress Notes (Signed)
[facility] Pall[facility] Consult Note Telephone: [phone]  Fax: [phone]    Date of encounter: 10/26/20 10:06 PM PATIENT NAME: Dean Lynch [place] [id number]   [phone] (home)  DOB: Apr 19, 1950 MRN: [id number] PRIMARY CARE PROVIDER:    Toni Arthurs, NP,  [place] [address] [phone]  RESPONSIBLE PARTY:    Contact Information     Name Relation Home Work [address]   [place], [place] [phone]  [phone]      I met face to face with patient and family in home. Palliative Care was asked to follow this patient by consultation request of  Dean Arthurs, NP to address advance care planning and complex medical decision making. This is a follow up visit ASSESSMENT AND PLAN / RECOMMENDATIONS: Symptom Management/Plan: 1. ACP: DNR in place; in vynca; continue treat what is treatable; Dean Lynch had requested Hospice services as Dean Lynch is sleeping more.   2. Hallucinations secondary to parkinson/alzheimer's dementia. Dean Lynch has stopped giving trazodone and seroquel with increase in hallucination. 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 We talked about adaption with ongoing disease progression with increase in hallucinations. Dean Lynch endorses the hallucinations are not tormenting, will continue to monitor.   3. Debility worsening secondary to Parkinson with Alzheimer's dementia progressive; encourage mobility, self independence as able, fall risk; Previous discussion with Dean Lynch about PACE program which she decided financially not able to do. PT/OT finishing up, noted ongoing decline with recent frequent falls.    3. Palliative care encounter; Palliative care encounter; Palliative medicine team will continue to support patient, patient's family, and  medical team. Visit consisted of counseling and education dealing with the complex and emotionally intense issues of symptom management and palliative care in the setting of serious and potentially life-threatening illness  Follow up Palliative Care Visit: Palliative care will continue to follow for complex medical decision making, advance care planning, and clarification of goals. Return 4 weeks or prn.  I spent 65 minutes providing this consultation. More than 50% of the time in this consultation was spent in counseling and care coordination.  PPS: 40%  Chief Complaint: Follow up palliative consult for complex medical decision making  HISTORY OF PRESENT ILLNESS:  [name] is a 70 y.o. year old male  with multiple medical problems including  Parkinson, dementia, Chronic diastolic congestive heart failure, cirrhosis of liver, Diabetes, hypertension, hypercholesterolemia, BPH, gout, thrombocytopenia, Melanoma removed 2017, pacemaker implant, right cataract extraction with [facility]. I called [name] as regards to wishes for Hospice referral. Mrs. Lynch and I talked about Hospice criteria. Discussed current role PC. Dean Lynch in agreement to have PC f/u visit today, scheduled with covid screening negative. I visited [name] in their home. Dean Lynch was sitting on the couch. We talked about how he has been feeling. Dean Lynch smiling saying he is okay. We talked about recent labs, CT scan results and GI recommendations of comfort. We talked about chronic disease progression of parkinson with increase in hallucinations. Dean Lynch has stopped giving trazodone and seroquel with increase in hallucination. 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 We talked about adaption with ongoing disease progression with increase in hallucinations. [name] endorses the  hallucinations are  not tormenting, will continue to monitor. We talked about symptoms of pain, shortness of breath, weakness, fatigue, appetite with nutritional education. We talked about work [name] has done with PT/OT. We talked about caregiver stress, fatigue with coping strategies. We talked about Hospice with Hospice Physicians wished for PC to monitor for further progression. We talked about f/u pc visit, scheduled with PC SW/RN and f/u NP visit. Scheduled. We talked about quality of life,We talked about [name] only brother who died last week. Offered bereavement counseling, [name] declined. We talked about disease progression with realistic expectations. Reassured Ms. Dredge to call pc any changes. [name] in agreement  History obtained from review of EMR, discussion with Mrs and [name].  I reviewed available labs, medications, imaging, studies and related documents from the EMR.  Records reviewed and summarized above.   ROS Full 10 system review of systems performed and negative with exception of: as per HPI.   Physical Exam: Constitutional: NAD General: frail appearing, debilitated male EYES: lids intact ENMT: oral mucous membranes moist CV: S1S2, RRR Pulmonary: LCTA, no increased work of breathing, no cough, room air Abdomen: normo-active BS + 4 quadrants, soft and non tender, no ascites MSK: ambulatory with walker Skin: warm and dry, no rashes or wounds on visible skin Neuro:  + generalized weakness,  +cognitive impairment Psych: non-anxious affect, A and O x 2  Questions and concerns were addressed. The patient/family was encouraged to call with questions and/or concerns. My contact information was provided. Provided general support and encouragement, no other unmet needs identified   Thank you for the opportunity to participate in the care of [name].  The palliative care team will continue to follow. Please call our office at  [phone] if we can be of additional assistance.   This chart was dictated using voice recognition software.  Despite best efforts to proofread,  errors can occur which can change the documentation meaning.   Johnay Mano Z Keylee Shrestha, NP   COVID-19 PATIENT SCREENING TOOL Asked and negative response unless otherwise noted:   Have you had symptoms of covid, tested positive or been in contact with someone with symptoms/positive test in the past 5-10 days? NO

## 2020-11-06 DIAGNOSIS — F03918 Unspecified dementia, unspecified severity, with other behavioral disturbance: Secondary | ICD-10-CM | POA: Diagnosis not present

## 2020-11-06 DIAGNOSIS — Z9183 Wandering in diseases classified elsewhere: Secondary | ICD-10-CM | POA: Diagnosis not present

## 2020-11-06 DIAGNOSIS — F03911 Unspecified dementia, unspecified severity, with agitation: Secondary | ICD-10-CM | POA: Diagnosis not present

## 2020-11-07 DIAGNOSIS — I10 Essential (primary) hypertension: Secondary | ICD-10-CM | POA: Diagnosis not present

## 2020-11-07 DIAGNOSIS — F4321 Adjustment disorder with depressed mood: Secondary | ICD-10-CM | POA: Diagnosis not present

## 2020-11-07 DIAGNOSIS — E119 Type 2 diabetes mellitus without complications: Secondary | ICD-10-CM | POA: Diagnosis not present

## 2020-11-07 DIAGNOSIS — R7301 Impaired fasting glucose: Secondary | ICD-10-CM | POA: Diagnosis not present

## 2020-11-07 DIAGNOSIS — I441 Atrioventricular block, second degree: Secondary | ICD-10-CM | POA: Diagnosis not present

## 2020-11-07 DIAGNOSIS — E7800 Pure hypercholesterolemia, unspecified: Secondary | ICD-10-CM | POA: Diagnosis not present

## 2020-11-07 DIAGNOSIS — Z950 Presence of cardiac pacemaker: Secondary | ICD-10-CM | POA: Diagnosis not present

## 2020-11-07 DIAGNOSIS — Z0000 Encounter for general adult medical examination without abnormal findings: Secondary | ICD-10-CM | POA: Diagnosis not present

## 2020-11-07 DIAGNOSIS — E559 Vitamin D deficiency, unspecified: Secondary | ICD-10-CM | POA: Diagnosis not present

## 2020-11-07 DIAGNOSIS — M1A49X0 Other secondary chronic gout, multiple sites, without tophus (tophi): Secondary | ICD-10-CM | POA: Diagnosis not present

## 2020-11-07 DIAGNOSIS — D696 Thrombocytopenia, unspecified: Secondary | ICD-10-CM | POA: Diagnosis not present

## 2020-11-07 DIAGNOSIS — R8290 Unspecified abnormal findings in urine: Secondary | ICD-10-CM | POA: Diagnosis not present

## 2020-11-07 DIAGNOSIS — E538 Deficiency of other specified B group vitamins: Secondary | ICD-10-CM | POA: Diagnosis not present

## 2020-12-16 ENCOUNTER — Encounter: Payer: Self-pay | Admitting: Intensive Care

## 2020-12-16 ENCOUNTER — Emergency Department

## 2020-12-16 ENCOUNTER — Other Ambulatory Visit: Payer: Self-pay

## 2020-12-16 ENCOUNTER — Emergency Department
Admission: EM | Admit: 2020-12-16 | Discharge: 2020-12-16 | Disposition: A | Discharge status: Home / Self Care | Attending: Emergency Medicine | Admitting: Emergency Medicine

## 2020-12-16 DIAGNOSIS — Y92009 Unspecified place in unspecified non-institutional (private) residence as the place of occurrence of the external cause: Secondary | ICD-10-CM | POA: Diagnosis not present

## 2020-12-16 DIAGNOSIS — S066X0A Traumatic subarachnoid hemorrhage without loss of consciousness, initial encounter: Secondary | ICD-10-CM | POA: Diagnosis not present

## 2020-12-16 DIAGNOSIS — S0990XA Unspecified injury of head, initial encounter: Secondary | ICD-10-CM | POA: Diagnosis not present

## 2020-12-16 DIAGNOSIS — W07XXXA Fall from chair, initial encounter: Secondary | ICD-10-CM | POA: Insufficient documentation

## 2020-12-16 DIAGNOSIS — I110 Hypertensive heart disease with heart failure: Secondary | ICD-10-CM | POA: Diagnosis not present

## 2020-12-16 DIAGNOSIS — Z7982 Long term (current) use of aspirin: Secondary | ICD-10-CM | POA: Insufficient documentation

## 2020-12-16 DIAGNOSIS — S0101XA Laceration without foreign body of scalp, initial encounter: Secondary | ICD-10-CM | POA: Diagnosis not present

## 2020-12-16 DIAGNOSIS — I5033 Acute on chronic diastolic (congestive) heart failure: Secondary | ICD-10-CM | POA: Insufficient documentation

## 2020-12-16 DIAGNOSIS — Z85820 Personal history of malignant melanoma of skin: Secondary | ICD-10-CM | POA: Diagnosis not present

## 2020-12-16 DIAGNOSIS — E1129 Type 2 diabetes mellitus with other diabetic kidney complication: Secondary | ICD-10-CM | POA: Diagnosis not present

## 2020-12-16 DIAGNOSIS — S065X0A Traumatic subdural hemorrhage without loss of consciousness, initial encounter: Secondary | ICD-10-CM | POA: Diagnosis not present

## 2020-12-16 DIAGNOSIS — R410 Disorientation, unspecified: Secondary | ICD-10-CM | POA: Diagnosis not present

## 2020-12-16 DIAGNOSIS — S065XAA Traumatic subdural hemorrhage with loss of consciousness status unknown, initial encounter: Secondary | ICD-10-CM

## 2020-12-16 DIAGNOSIS — R739 Hyperglycemia, unspecified: Secondary | ICD-10-CM | POA: Diagnosis not present

## 2020-12-16 DIAGNOSIS — F0390 Unspecified dementia without behavioral disturbance: Secondary | ICD-10-CM | POA: Insufficient documentation

## 2020-12-16 DIAGNOSIS — W19XXXA Unspecified fall, initial encounter: Secondary | ICD-10-CM | POA: Diagnosis not present

## 2020-12-16 DIAGNOSIS — R58 Hemorrhage, not elsewhere classified: Secondary | ICD-10-CM | POA: Diagnosis not present

## 2020-12-16 MED ORDER — ACETAMINOPHEN 325 MG PO TABS
650.0000 mg | ORAL_TABLET | Freq: Once | ORAL | Status: AC
  Administered 2020-12-16: 650 mg via ORAL
  Filled 2020-12-16: qty 2

## 2020-12-16 MED ORDER — LIDOCAINE HCL (PF) 1 % IJ SOLN
5.0000 mL | Freq: Once | INTRAMUSCULAR | Status: AC
  Administered 2020-12-16: 5 mL via INTRADERMAL
  Filled 2020-12-16: qty 5

## 2020-12-16 NOTE — ED Provider Notes (Signed)
-----------------------------------------   3:43 PM on 12/16/2020 ----------------------------------------- Patient presented to the ED for fall from shower chair, noted to have laceration to the back of his head which was repaired by NP Triplett.  CT head and cervical spine were performed at the request of patient's wife and are positive for a small left parafalcine subdural hematoma.  Patient remains awake and alert with no complaints other than some lateral neck pain at this time, is at his baseline mental status with no focal neurologic deficits.  Patient is currently under the care of hospice due to advanced dementia and other chronic medical conditions.  I confirmed with patient and his wife that they are not interested in any surgical intervention on this and are not interested in resending hospice care or providing more aggressive care, prefer to focus on quality of life.  Case discussed with Dr. Lacinda Axon of neurosurgery, who agrees that repeat CT scan or other intervention are not indicated given patient is on hospice.  We will briefly observe patient here in the ED to ensure that he does not rapidly decline and develop need for more intensive comfort care.  ----------------------------------------- 5:58 PM on 12/16/2020 ----------------------------------------- Patient has been observed here in the ED for greater than 7 hours, remains awake and alert with no complaints at this time and no focal neurologic deficits on exam.  Low suspicion for worsening subdural hematoma at this time and even if this were to be the case, patient and wife are not interested in any intervention.  Patient's palliative care social worker, Deri Fuelling, was updated on his condition and plan for discharge home.  She will arrange for in-home nurse visit in 2 days and patient's wife was advised to have staples removed in about 1 week.  Patient and wife agree with plan.   Blake Divine, MD 12/16/20 [phone]

## 2020-12-16 NOTE — ED Triage Notes (Signed)
Patient arrived by EMS from home after fall from shower chair. Laceration to back of head. Patient denies LOC. Denies pain. EMS vitals 91/65 blood pressure, 244 blood sugar, 98% O2, 92HR. Patient is on hospice. EMS reports he was able to ambulate when they arrived

## 2020-12-16 NOTE — ED Notes (Signed)
Pt sitting in wheelchair, pt states he fell and hit the back of his head. Back of head with pressure dressing applied, no bleeding noted. Pt denies LOC. Pt states he is on a blood thinner but cannot remember which one. Pt alert, oriented.

## 2020-12-16 NOTE — ED Notes (Signed)
Pt helped with urinal, pt stood by bed with one assist to get up.

## 2020-12-16 NOTE — ED Notes (Signed)
Patient tolerated staple placement well. Area around wound was cleaned. Patient tolerated well.

## 2020-12-16 NOTE — ED Provider Notes (Signed)
[facility] Emergency Department Provider Note ____________________________________________   Event Date/Time   First MD Initiated Contact with Patient 12/16/20 1048     (approximate)  I have reviewed the triage vital signs and the nursing notes.   HISTORY  Chief Complaint Fall  HPI Dean Lynch is a 70 y.o. male with history of diabetes, cirrhosis, dementia, CHF, remaining history as listed below presents to the emergency department for treatment and evaluation after mechanical, nonsyncopal fall this morning.  Wife states that he was going to sit in the shower chair to shave, but when he went to sit down the shower chair tipped and he fell, hitting his head.  No loss of consciousness.  He is not on any anticoagulants with the exception of a daily 81 mg aspirin.  He has a laceration to the back of his head.      Past Medical History:  Diagnosis Date   BPH (benign prostatic hyperplasia)    Cirrhosis of liver ([place])    Diabetes mellitus without complication ([place])    Gout    Hypercholesteremia    Hypertension    Skin cancer 2017   melanoa- removed at [facility]   Thrombocytopenia [facility])     Patient Active Problem List   Diagnosis Date Noted   Acute pain of left knee    Impaired fasting glucose    Epididymitis, right    Acute cystitis 03/24/2020   Fever    Abdominal pain, suprapubic    Cirrhosis of liver (HCC)    Weakness    Elevated INR    UTI (urinary tract infection) 03/23/2020   Dehydration    Dementia without behavioral disturbance (HCC)    Type II diabetes mellitus with renal manifestations ([place]) 28/41/3244   Acute metabolic encephalopathy 01/09/7251   Nausea & vomiting 02/19/2020   AKI (acute kidney injury) ([place]) 02/19/2020   Chronic diastolic CHF (congestive heart failure) ([place]) 02/19/2020   Pressure injury of skin 02/19/2020   Agitation 02/15/2020   Bradycardia 12/23/2019   Acute CHF (congestive heart failure) ([place]) 12/23/2019    Thrombocytopenia ([place]) 07/18/2016   Benign prostatic hyperplasia without lower urinary tract symptoms 07/16/2016   History of melanoma 12/06/2015   Diabetes mellitus type 2, uncomplicated ([place]) 66/44/0347   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 tophus 12/15/2013   Essential hypertension 12/15/2013   Hypercholesterolemia 12/15/2013    Past Surgical History:  Procedure Laterality Date   CATARACT EXTRACTION W/PHACO Right 09/27/2015   Procedure: CATARACT EXTRACTION PHACO AND INTRAOCULAR LENS PLACEMENT ([place]);  Surgeon: Leandrew Koyanagi, MD;  Location: [facility];  Service: Ophthalmology;  Laterality: Right;  DIABETIC   KNEE ARTHROSCOPY     PACEMAKER IMPLANT N/A 12/24/2019   Procedure: PACEMAKER IMPLANT;  Surgeon: Marzetta Board, MD;  Location: [facility];  Service: Cardiovascular;  Laterality: N/A;    Prior to Admission medications   Medication Sig Start Date End Date Taking? Authorizing Provider  aspirin EC 81 MG tablet Take 81 mg by mouth daily.  02/25/18   [provider]  divalproex (DEPAKOTE) 125 MG DR tablet Take 125 mg by mouth at bedtime. 02/17/20   [provider]  dutasteride (AVODART) 0.5 MG capsule Take 0.5 mg by mouth every morning.    [provider]  feeding supplement (ENSURE ENLIVE / ENSURE PLUS) LIQD Take 237 mLs by mouth 3 (three) times daily between meals. 03/27/20   Loletha Grayer, MD  Omega-3 Fatty Acids (FISH OIL) 1000 MG CAPS Take 1,000 mg by  mouth daily.    [provider]  rivastigmine (EXELON) 1.5 MG capsule Take by mouth. 09/14/20 12/13/20  [provider]  traZODone (DESYREL) 100 MG tablet Take 100 mg by mouth at bedtime.    [provider]    Allergies Patient has no known allergies.  Family History  Problem Relation Age of Onset   Coronary artery disease Mother    Polycythemia Mother    Macular degeneration Mother    Dementia Mother    Coronary artery disease Father     Social History Social  History   Tobacco Use   Smoking status: Never   Smokeless tobacco: Never  Vaping Use   Vaping Use: Never used  Substance Use Topics   Alcohol use: No   Drug use: No    Review of Systems  Constitutional: No fever/chills Eyes: No visual changes. ENT: No sore throat. Cardiovascular: Denies chest pain. Respiratory: Denies shortness of breath. Gastrointestinal: No abdominal pain.  No nausea, no vomiting.  No diarrhea.  No constipation. Genitourinary: Negative for dysuria. Musculoskeletal: Negative for back pain. Skin: Positive for scalp laceration. Neurological: Negative for headaches, focal weakness or numbness.  ____________________________________________   PHYSICAL EXAM:  VITAL SIGNS: ED Triage Vitals [12/16/20 1021]  Enc Vitals Group     BP (!) 144/84     Pulse Rate 87     Resp 16     Temp 98.2 F (36.8 C)     Temp Source Oral     SpO2 98 %     Weight 190 lb (86.2 kg)     Height 5\' 10"  (1.778 m)     Head Circumference      Peak Flow      Pain Score 0     Pain Loc      Pain Edu?      Excl. in [place]?     Constitutional: Alert and oriented.  Chronically ill appearing and in no acute distress. Eyes: Conjunctivae are normal. PERRL. EOMI. Head: Laceration to the posterior parietal scalp Nose: No congestion/rhinnorhea..  No epistaxis Mouth/Throat: Mucous membranes are moist.  Oropharynx non-erythematous. Neck: No stridor.  No focal midline tenderness Hematological/Lymphatic/Immunilogical: No cervical lymphadenopathy. Cardiovascular: Normal rate, regular rhythm. Grossly normal heart sounds.  Good peripheral circulation. Respiratory: Normal respiratory effort.  No retractions. Lungs CTAB. Gastrointestinal: Soft and nontender. No distention. No abdominal bruits. Genitourinary:  Musculoskeletal: No lower extremity tenderness nor edema.  No joint effusions. Neurologic:  Normal speech and language. No gross focal neurologic deficits are appreciated. No gait  instability. Skin:  9cm scalp laceration. Psychiatric: Mood and affect are normal. Speech and behavior are normal.  ____________________________________________   LABS (all labs ordered are listed, but only abnormal results are displayed)  Labs Reviewed - No data to display ____________________________________________  EKG  Not indicated ____________________________________________  RADIOLOGY  ED MD interpretation:    Small subarachnoid hematoma without shift on head CT.   No acute concerns on CT cervical spine.  I, Sherrie George, personally viewed and evaluated these images (plain radiographs) as part of my medical decision making, as well as reviewing the written report by the radiologist.  Official radiology report(s): CT Head Wo Contrast  Result Date: 12/16/2020 CLINICAL DATA:  Head laceration after fall. EXAM: CT HEAD WITHOUT CONTRAST CT CERVICAL SPINE WITHOUT CONTRAST TECHNIQUE: Multidetector CT imaging of the head and cervical spine was performed following the standard protocol without intravenous contrast. Multiplanar CT image reconstructions of the cervical spine were also generated. COMPARISON:  September 17, 2020. FINDINGS: CT HEAD FINDINGS Brain: Small left para falcine subdural hematoma is noted. Ventricular size is within normal limits. No acute infarction is noted. No midline shift is noted. No mass lesion is noted. Vascular: No hyperdense vessel or unexpected calcification. Skull: Normal. Negative for fracture or focal lesion. Sinuses/Orbits: No acute finding. Other: Small posterior scalp contusion is noted. CT CERVICAL SPINE FINDINGS Alignment: Minimal retrolisthesis of C4-5 is noted secondary to moderate degenerative disc disease at this level. Skull base and vertebrae: No acute fracture. No primary bone lesion or focal pathologic process. Soft tissues and spinal canal: No prevertebral fluid or swelling. No visible canal hematoma. Disc levels: Moderate degenerative  disc disease is noted at C4-5 and C7-T1. Mild degenerative disc disease is noted at C5-6 and C6-7. Upper chest: Negative. Other: None. IMPRESSION: Small left parafalcine subdural hematoma is noted. No midline shift is noted. Small posterior scalp contusion is noted. Critical Value/emergent results were called by telephone at the time of interpretation on 12/16/2020 at 12:51 pm to provider Jimmy Stipes , who verbally acknowledged these results. Multilevel degenerative changes are noted in the cervical spine. No fracture is noted. Electronically Signed   By: Marijo Conception M.D.   On: 12/16/2020 12:51   CT Cervical Spine Wo Contrast  Result Date: 12/16/2020 CLINICAL DATA:  Head laceration after fall. EXAM: CT HEAD WITHOUT CONTRAST CT CERVICAL SPINE WITHOUT CONTRAST TECHNIQUE: Multidetector CT imaging of the head and cervical spine was performed following the standard protocol without intravenous contrast. Multiplanar CT image reconstructions of the cervical spine were also generated. COMPARISON:  September 17, 2020. FINDINGS: CT HEAD FINDINGS Brain: Small left para falcine subdural hematoma is noted. Ventricular size is within normal limits. No acute infarction is noted. No midline shift is noted. No mass lesion is noted. Vascular: No hyperdense vessel or unexpected calcification. Skull: Normal. Negative for fracture or focal lesion. Sinuses/Orbits: No acute finding. Other: Small posterior scalp contusion is noted. CT CERVICAL SPINE FINDINGS Alignment: Minimal retrolisthesis of C4-5 is noted secondary to moderate degenerative disc disease at this level. Skull base and vertebrae: No acute fracture. No primary bone lesion or focal pathologic process. Soft tissues and spinal canal: No prevertebral fluid or swelling. No visible canal hematoma. Disc levels: Moderate degenerative disc disease is noted at C4-5 and C7-T1. Mild degenerative disc disease is noted at C5-6 and C6-7. Upper chest: Negative. Other: None.  IMPRESSION: Small left parafalcine subdural hematoma is noted. No midline shift is noted. Small posterior scalp contusion is noted. Critical Value/emergent results were called by telephone at the time of interpretation on 12/16/2020 at 12:51 pm to provider Brynne Doane , who verbally acknowledged these results. Multilevel degenerative changes are noted in the cervical spine. No fracture is noted. Electronically Signed   By: Marijo Conception M.D.   On: 12/16/2020 12:51    ____________________________________________   PROCEDURES  Procedure(s) performed (including Critical Care):  Marland KitchenMarland KitchenLaceration Repair  Date/Time: 12/16/2020 1:56 PM Performed by: Victorino Dike, FNP Authorized by: Victorino Dike, FNP   Consent:    Consent obtained:  Verbal   Consent given by:  Patient   Risks discussed:  Poor cosmetic result Universal protocol:    Patient identity confirmed:  Verbally with patient Laceration details:    Location:  Scalp   Scalp location:  L parietal   Length (cm):  8 Exploration:    Hemostasis achieved with:  Direct pressure Treatment:    Area cleansed with:  Chlorhexidine and saline  Amount of cleaning:  Standard   Irrigation method:  Syringe   Debridement:  None Skin repair:    Repair method:  Staples   Number of staples:  9 Approximation:    Approximation:  Close Repair type:    Repair type:  Simple Post-procedure details:    Procedure completion:  Tolerated  ____________________________________________   INITIAL IMPRESSION / ASSESSMENT AND PLAN     70 year old male presenting to the emergency department after mechanical, nonsyncopal fall at home.  See HPI for further details.  Discussed CT head and cervical spine with patient and family and they would like to proceed despite likelihood of intracranial injury requiring intervention since patient is currently on hospice.  DIFFERENTIAL DIAGNOSIS  Scalp laceration, intracranial hemorrhage, skull fracture  ED  COURSE  CT head shows a small subdural hematoma without midline shift and multilievel degenerative changes of the cervical spine without acute fracture. Results discussed with patient and wife. Will call and speak with neurosurgery. Scalp laceration repaired as above. Bleeding well controlled.  Consulted with Dr. Lacinda Axon who offered the recommendation to clarify patient and wife's wishes for further testing or aggressive care. I will have discussion with patient and family regarding their wishes surrounding aggressive testing/treatment. Due to multiple medical conditions that have lead to hospice, patient is not likely a good candidate for surgical intervention if found to be needed.   Patient and family are indecisive regarding further testing and treatment. Patient gave permission to speak with hospice nurse, Horris Latino. I have spoken with her and she plans to have the hospice social worker get involved as well as speak with Mrs. Amezcua to help with decision making.  Case discussed with ER attending, Dr. Charna Archer, who will speak to the patient and family and determine how to proceed.    As part of my medical decision making, I reviewed the following data within the [place] History obtained from family, Patient signed out to Dr. Charna Archer, Radiograph reviewed--SH on CT, Discussed with radiologist, and A consult was requested and obtained from this/these consultant(s) Neurosurgery  ___________________________________________   FINAL CLINICAL IMPRESSION(S) / ED DIAGNOSES  Final diagnoses:  Subarachnoid hematoma, without loss of consciousness, initial encounter [facility])  Scalp laceration, initial encounter     ED Discharge Orders     None        Dean Lynch was evaluated in Emergency Department on 12/16/2020 for the symptoms described in the history of present illness. He was evaluated in the context of the global COVID-19 pandemic, which necessitated consideration that  the patient might be at risk for infection with the SARS-CoV-2 virus that causes COVID-19. Institutional protocols and algorithms that pertain to the evaluation of patients at risk for COVID-19 are in a state of rapid change based on information released by regulatory bodies including the CDC and federal and state organizations. These policies and algorithms were followed during the patient's care in the ED.   Note:  This document was prepared using Dragon voice recognition software and may include unintentional dictation errors.    Victorino Dike, FNP 12/16/20 1543    Blake Divine, MD 12/16/20 [phone]

## 2020-12-21 ENCOUNTER — Other Ambulatory Visit: Payer: PPO

## 2020-12-21 ENCOUNTER — Ambulatory Visit: Payer: PPO | Admitting: Nurse Practitioner

## 2021-01-19 NOTE — Telephone Encounter (Signed)
error 

## 2021-02-02 DIAGNOSIS — R404 Transient alteration of awareness: Secondary | ICD-10-CM | POA: Diagnosis not present

## 2021-02-02 DIAGNOSIS — R531 Weakness: Secondary | ICD-10-CM | POA: Diagnosis not present

## 2021-02-02 DIAGNOSIS — R0902 Hypoxemia: Secondary | ICD-10-CM | POA: Diagnosis not present

## 2021-02-07 DEATH — deceased

## 2021-06-02 IMAGING — DX DG KNEE 3 VIEWS*L*
3 series · 4 of 4 positions shown · non-contrast
Comparison: Report MRI from 01/19/2000

CLINICAL DATA: Knee pain

EXAM:
LEFT KNEE - 3 VIEW

[knee ap]
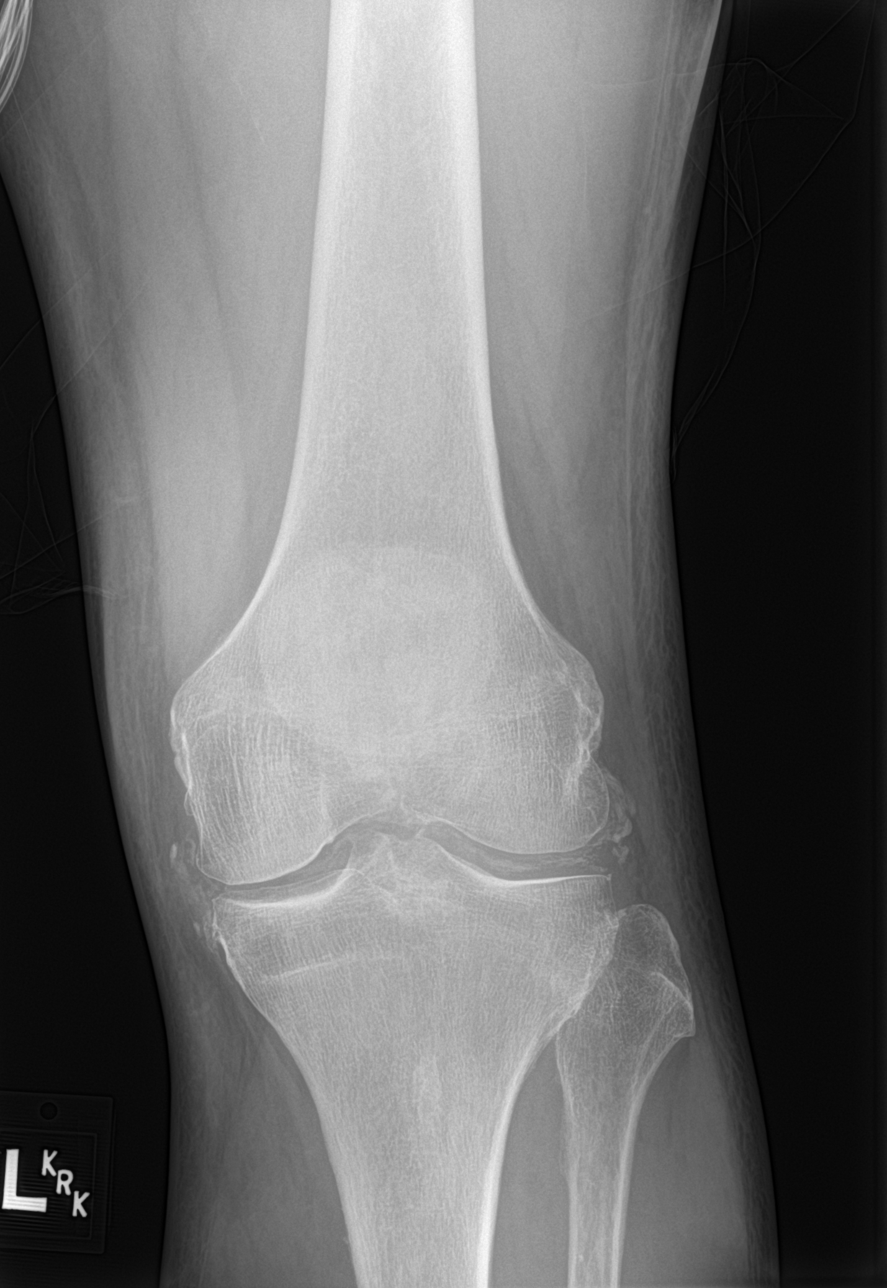

[Series 3: knee lat · 0.14mm/px · 2 of 2 slices shown]
[im 1/2]
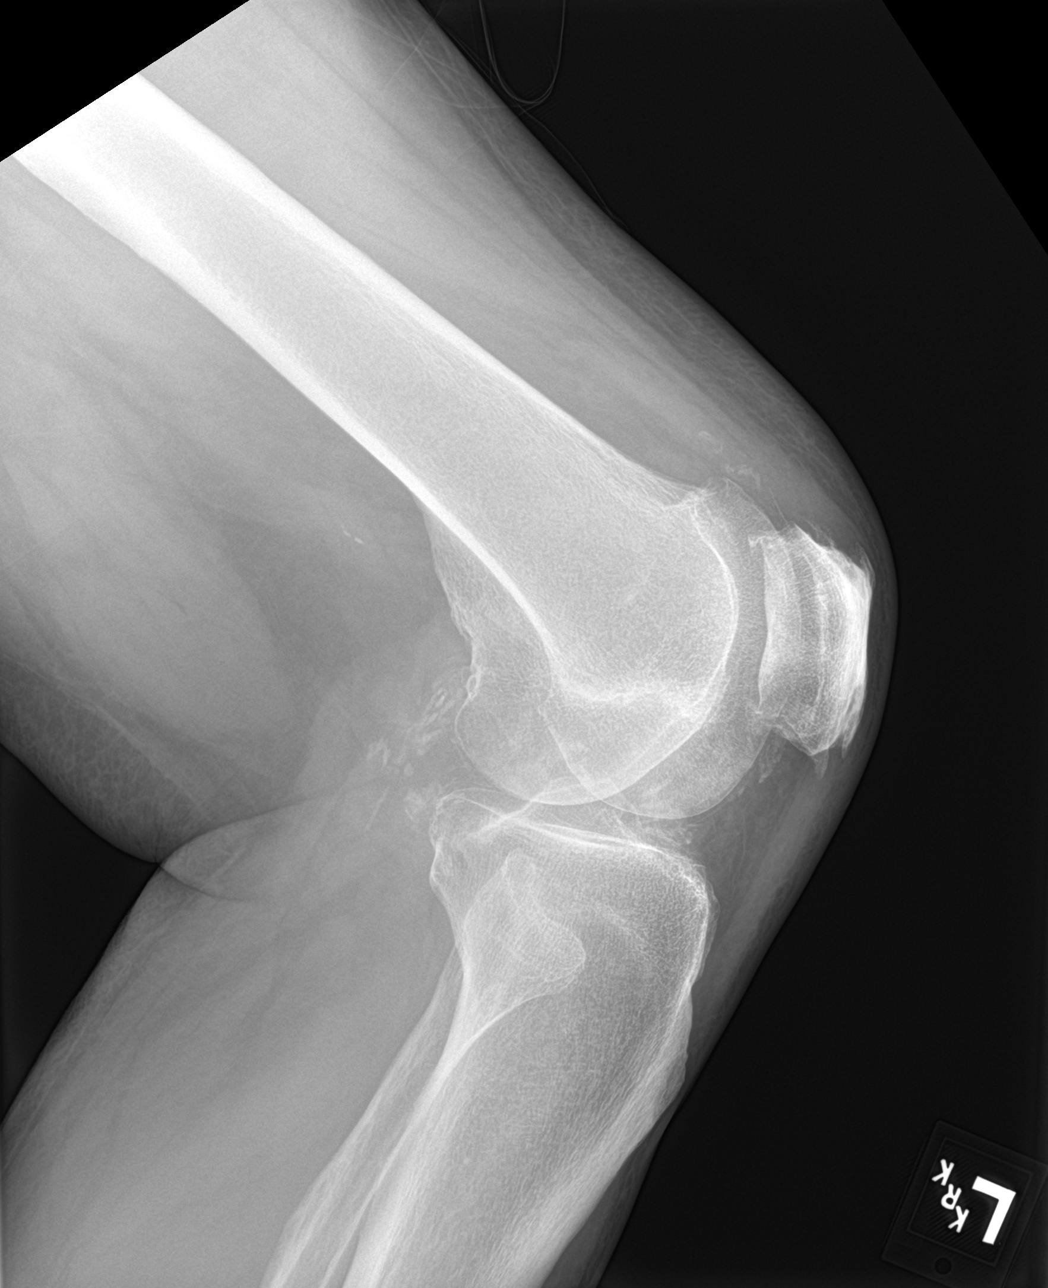
[im 2/2]
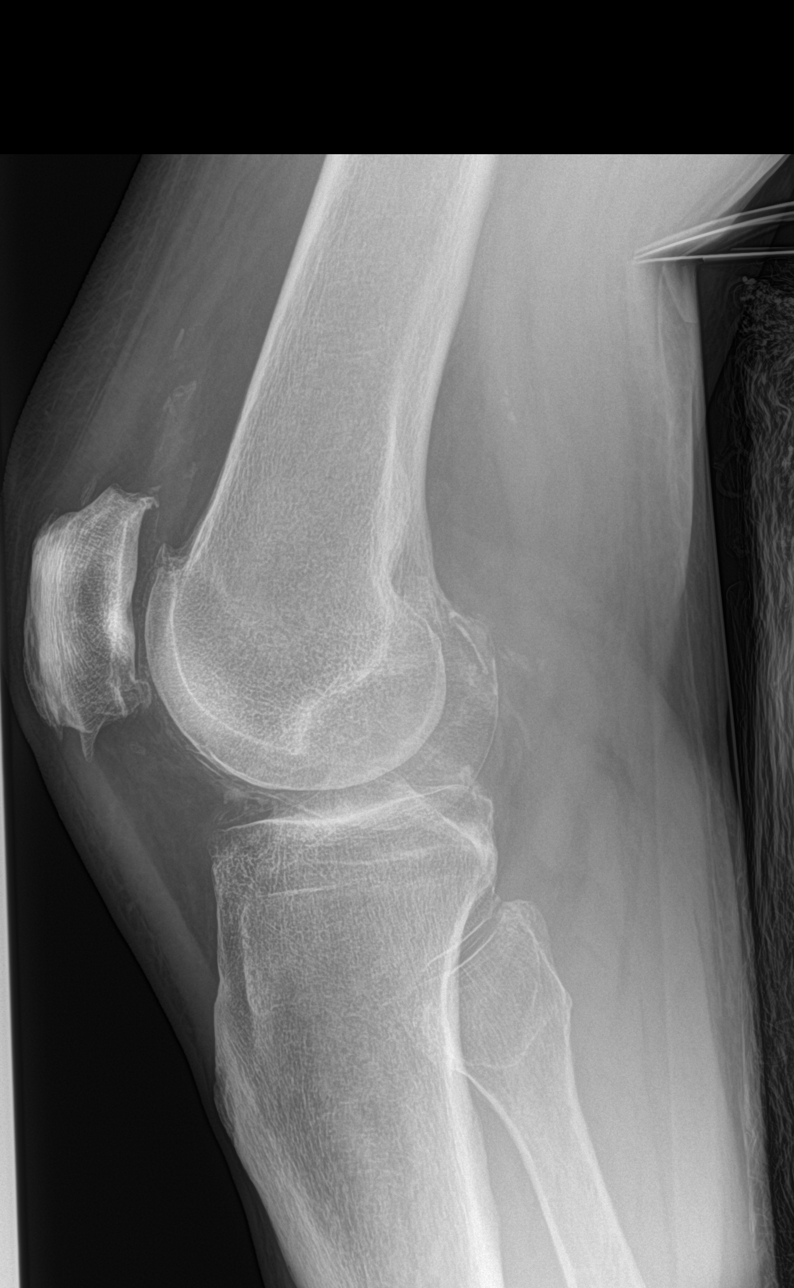

[knee obl]
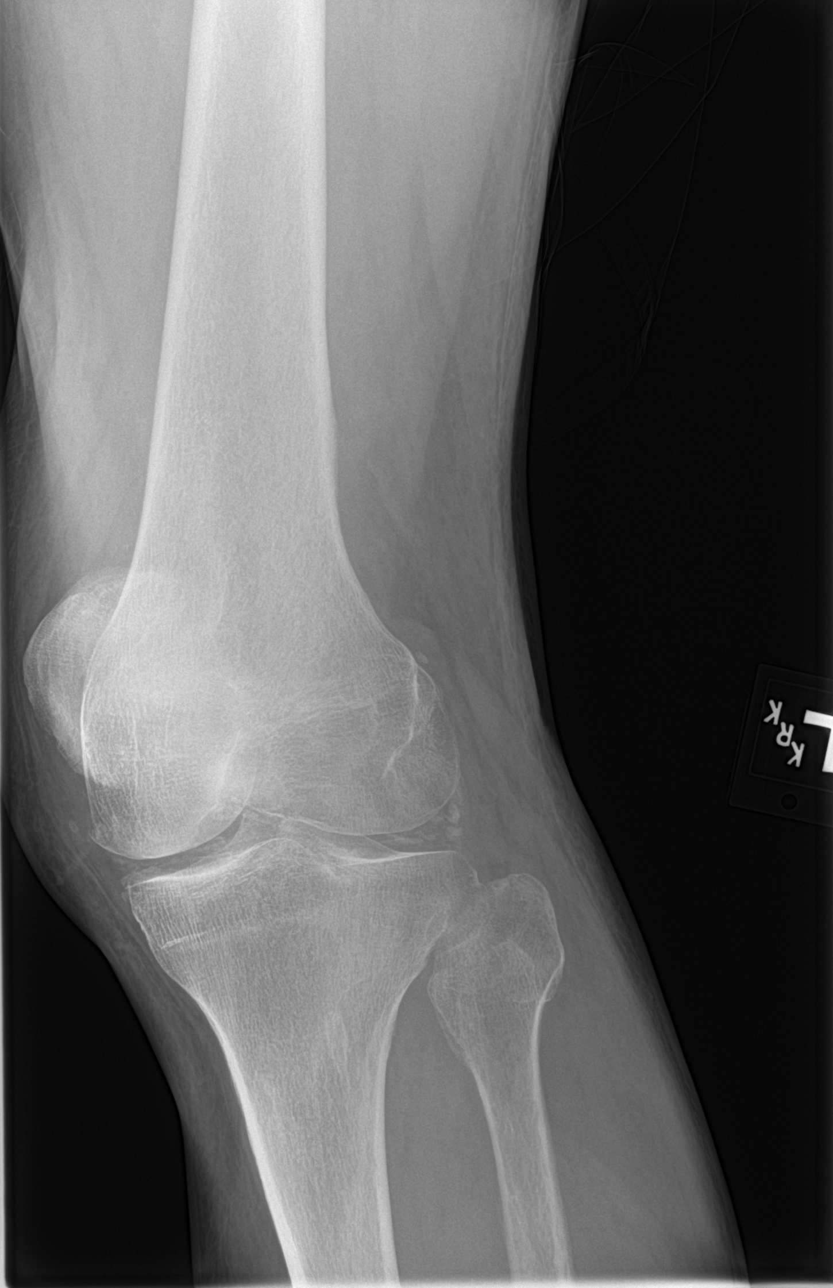

[4 of 4 positions shown; findings below may reference images not displayed]

FINDINGS: Meniscal chondrocalcinosis potentially with calcifications along the
joint capsule. Tricompartmental spurring.

Small effusion in the suprapatellar bursa. No fracture or acute bony
findings.
IMPRESSION: 1. Tricompartmental spurring.
2. Meniscal chondrocalcinosis, potentially with calcifications along
the joint capsule. This may reflect CPPD arthropathy.
3. Small effusion in the suprapatellar bursa.

## 2021-07-03 IMAGING — CT CT ABD-PELV W/ CM
2 of 5 series · 16 of 46 positions shown, 18 images · IV contrast (omnipaque)
Comparison: Noncontrast CT on 03/23/2020

CLINICAL DATA: Cirrhosis. Unintentional 20 lb weight loss in past 6
months.

EXAM:
CT ABDOMEN AND PELVIS WITH CONTRAST
TECHNIQUE: Multidetector CT imaging of the abdomen and pelvis was performed
using the standard protocol following bolus administration of
intravenous contrast.
CONTRAST:  75mL OMNIPAQUE IOHEXOL 300 MG/ML  SOLN

[Series 2: abd pelvis 5.00 · axial · 0.82mm/px · z∈[-1591,-1156]mm · 13 of 99 slices shown, 15 images]
[im 6/99  soft-tissue]
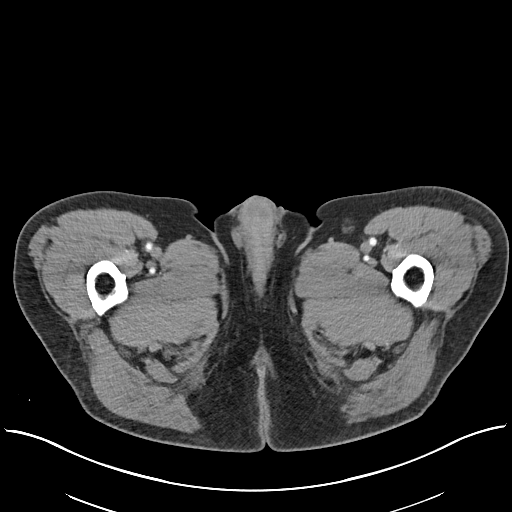
[im 6/99  bone]
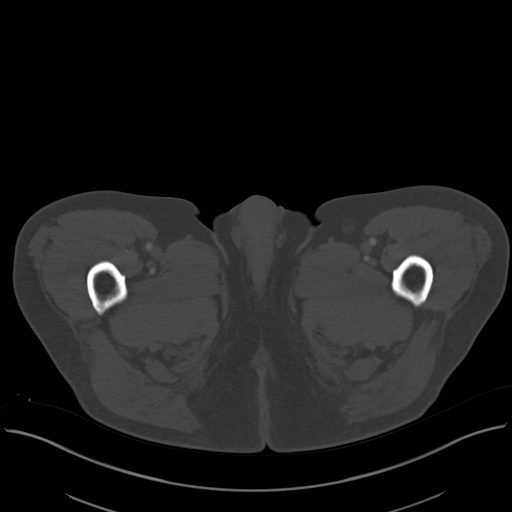
[im 16/99  soft-tissue]
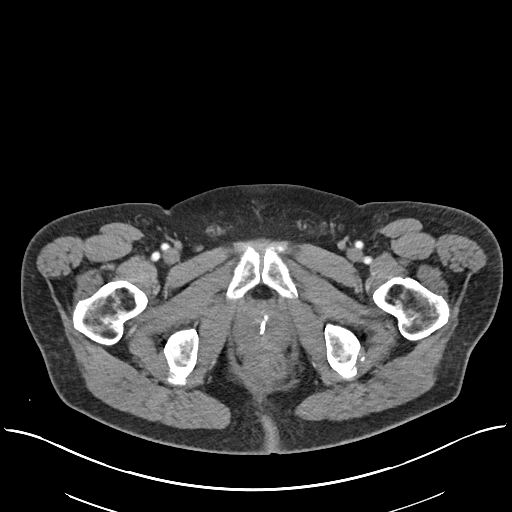
[im 21/99  soft-tissue]
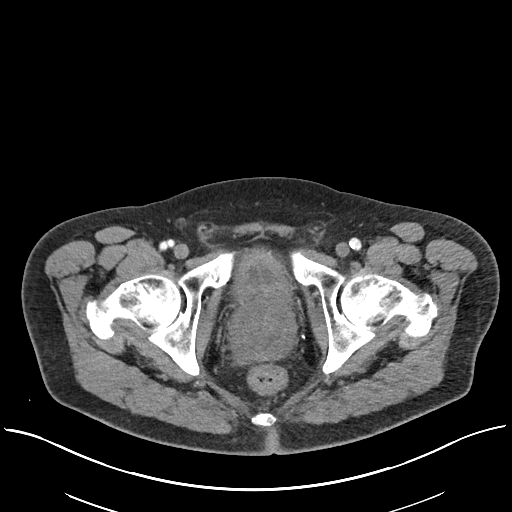
[im 26/99  soft-tissue]
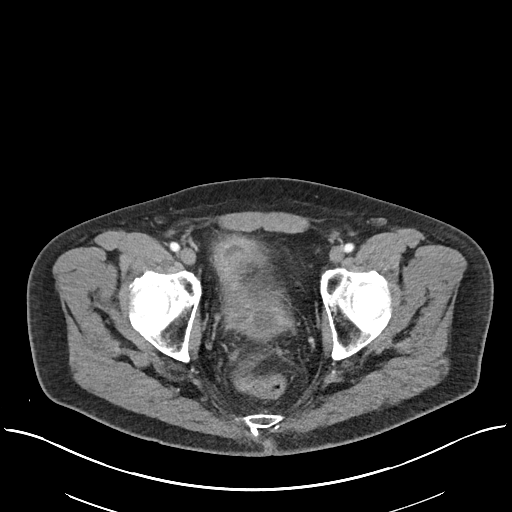
[im 37/99  soft-tissue]
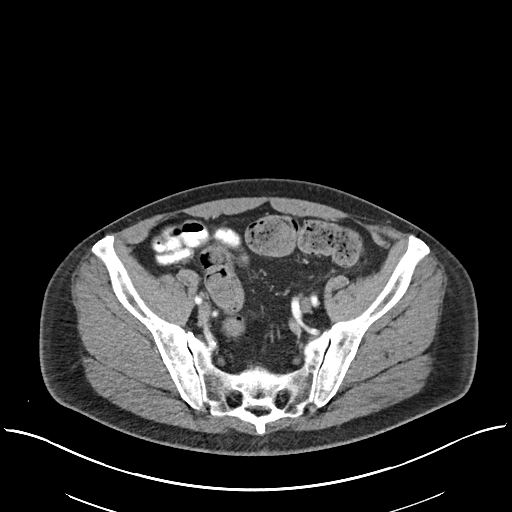
[im 42/99  soft-tissue]
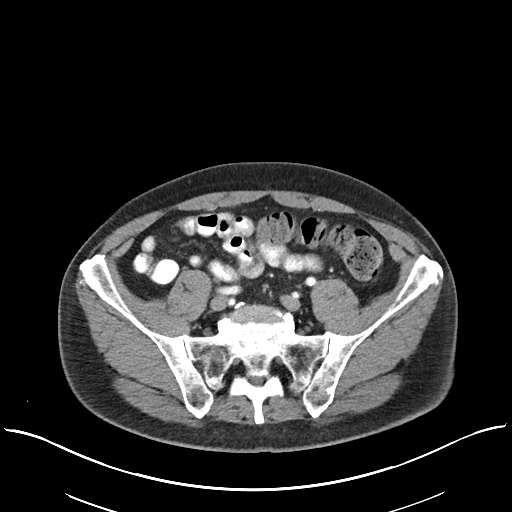
[im 52/99  soft-tissue]
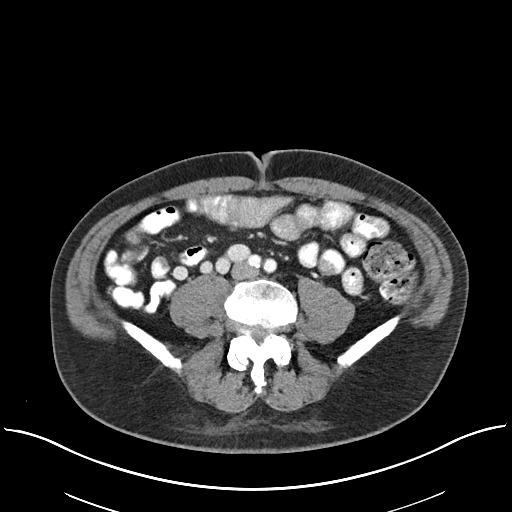
[im 57/99  soft-tissue]
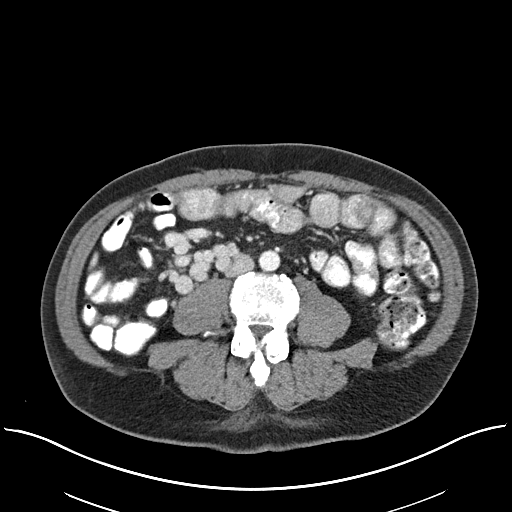
[im 62/99  soft-tissue]
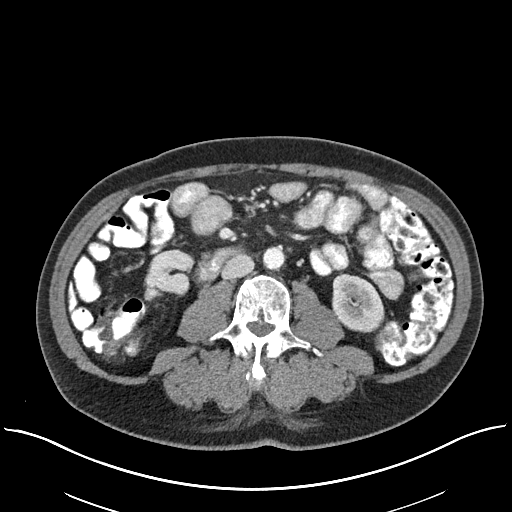
[im 62/99  bone]
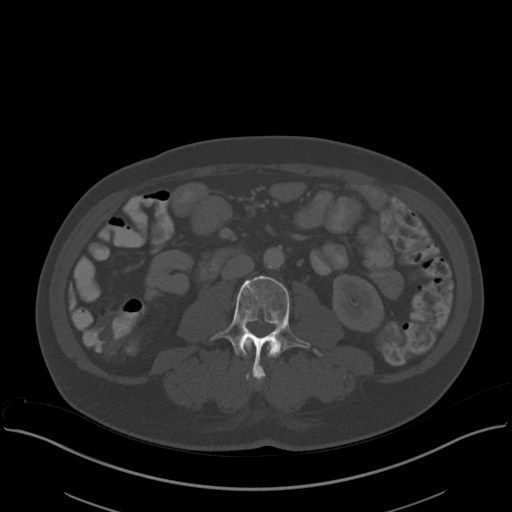
[im 73/99  soft-tissue]
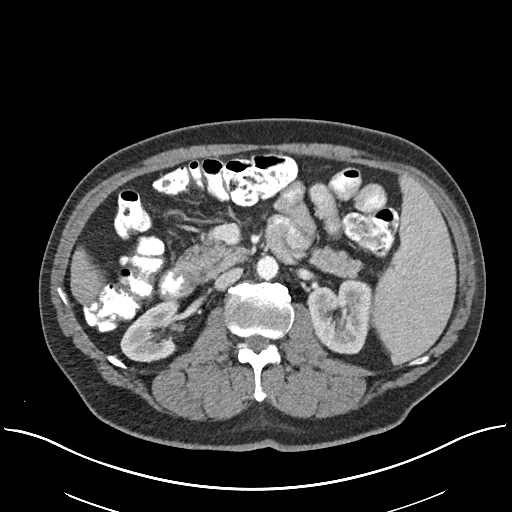
[im 78/99  soft-tissue]
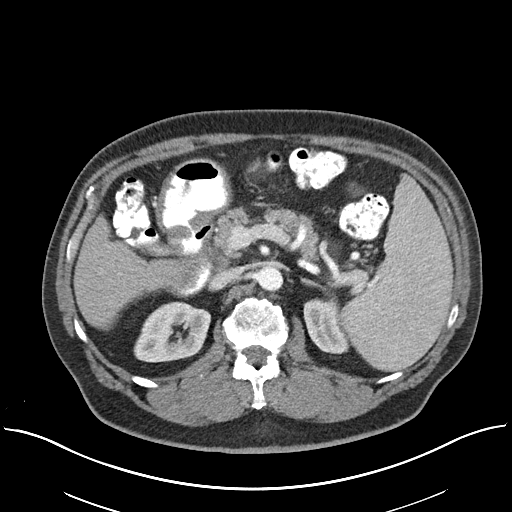
[im 83/99  soft-tissue]
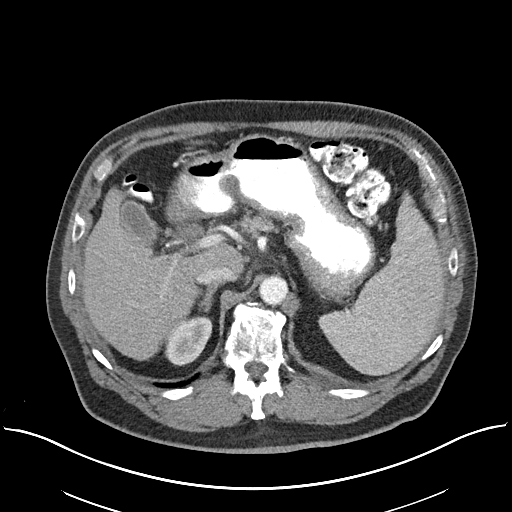
[im 93/99  soft-tissue]
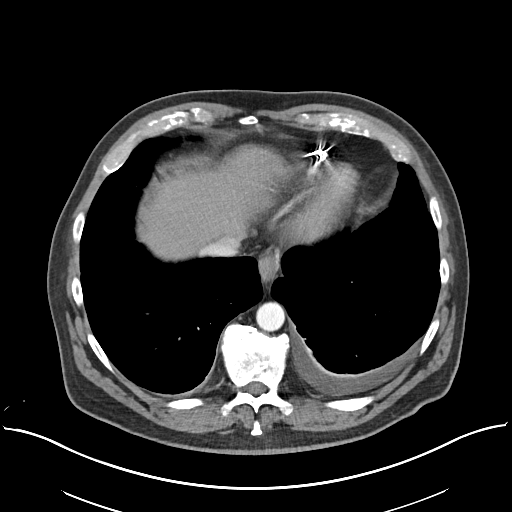

[Series 4: coronals abd pelvis 2.00 cor · coronal · 0.81mm/px · 3 of 153 slices shown]
[im 51/153  soft-tissue]
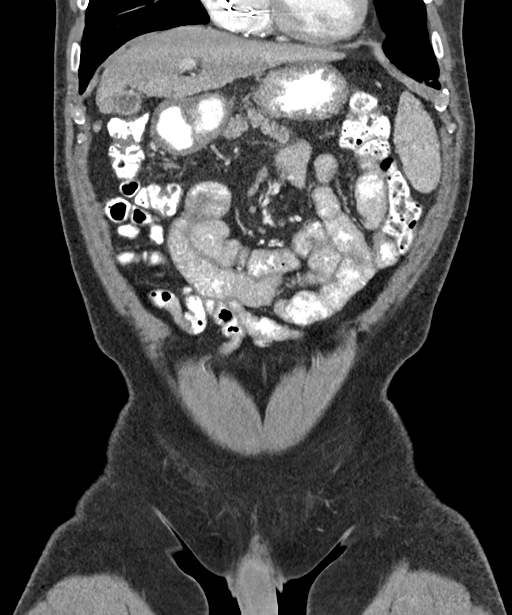
[im 68/153  soft-tissue]
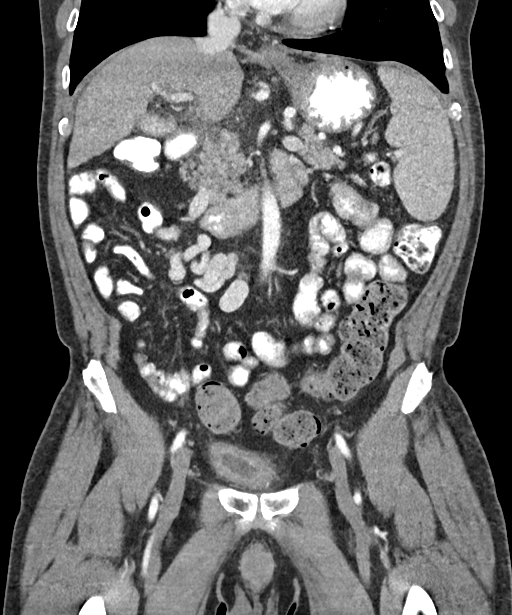
[im 85/153  soft-tissue]
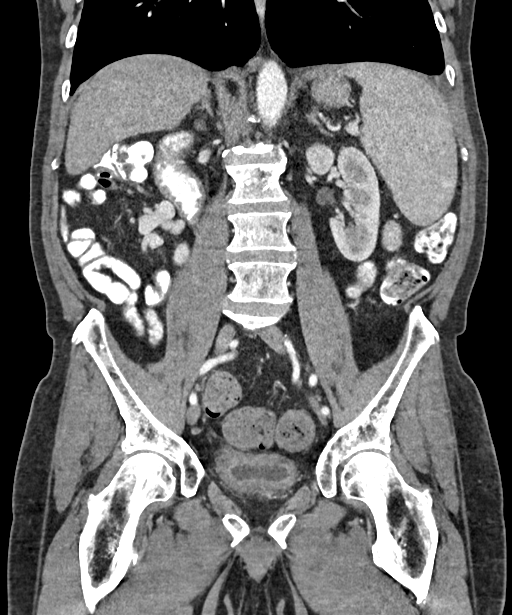

[16 of 46 positions shown; findings below may reference images not displayed]

FINDINGS: Lower Chest: Small left pleural effusion shows slight decrease since
previous study.

Hepatobiliary: Cirrhosis is again demonstrated. 9 mm low-attenuation
lesion in the left hepatic lobe is too small to characterize, and
was not definitely visualized on prior unenhanced exams. No other
liver lesions identified. Tiny calcified gallstones are seen along
the dependent gallbladder wall, however there is no evidence of
cholecystitis or biliary ductal dilatation.

Pancreas:  No mass or inflammatory changes.

Spleen: Stable mild splenomegaly, consistent with portal venous
hypertension. No splenic masses identified.

Adrenals/Urinary Tract: No masses identified. 2 mm calculus again
noted in midpole of right kidney. No evidence of ureteral calculi or
hydronephrosis. Urinary bladder is less distended, but shows
increased diffuse bladder wall thickening and mucosal enhancement.
This may be due to chronic bladder outlet obstruction although acute
cystitis cannot be excluded.

Stomach/Bowel: No evidence of obstruction, inflammatory process or
abnormal fluid collections.

Vascular/Lymphatic: No pathologically enlarged lymph nodes. No acute
vascular findings. Aortic atherosclerotic calcification noted.

Reproductive:  Stable mildly enlarged prostate.

Other:  None.

Musculoskeletal:  No suspicious bone lesions identified.
IMPRESSION: Cirrhosis and findings of portal venous hypertension. No evidence of
ascites.

9 mm low-attenuation lesion in left hepatic lobe is too small to
characterize, but not definitely seen on prior noncontrast CT.
Recommend abdomen MRI without and with contrast for further
characterization.

Cholelithiasis. No radiographic evidence of cholecystitis.

Increased diffuse bladder wall thickening and mucosal enhancement,
which may be due to chronic bladder outlet obstruction or acute
cholecystitis. Suggest correlation with urinalysis.

Stable mildly enlarged prostate.

Slight decrease in small left pleural effusion.

Tiny nonobstructing right renal calculus.

Aortic Atherosclerosis ([id number]).
